# Patient Record
Sex: Male | Born: 1965 | Race: White | Hispanic: No | Marital: Married | Smoking: Never smoker
Health system: Southern US, Community
[De-identification: ages and names within clinical notes are randomized; demographics above are authoritative.]

## PROBLEM LIST (undated history)

## (undated) DIAGNOSIS — I77810 Thoracic aortic ectasia: Secondary | ICD-10-CM

## (undated) DIAGNOSIS — E785 Hyperlipidemia, unspecified: Secondary | ICD-10-CM

## (undated) DIAGNOSIS — I10 Essential (primary) hypertension: Secondary | ICD-10-CM

## (undated) DIAGNOSIS — I351 Nonrheumatic aortic (valve) insufficiency: Secondary | ICD-10-CM

## (undated) DIAGNOSIS — I5022 Chronic systolic (congestive) heart failure: Secondary | ICD-10-CM

## (undated) DIAGNOSIS — E119 Type 2 diabetes mellitus without complications: Secondary | ICD-10-CM

## (undated) DIAGNOSIS — J101 Influenza due to other identified influenza virus with other respiratory manifestations: Secondary | ICD-10-CM

## (undated) DIAGNOSIS — I420 Dilated cardiomyopathy: Secondary | ICD-10-CM

## (undated) HISTORY — PX: CORONARY ANGIOPLASTY: SHX604

## (undated) HISTORY — PX: OTHER SURGICAL HISTORY: SHX169

## (undated) HISTORY — DX: Hyperlipidemia, unspecified: E78.5

## (undated) SURGERY — Surgical Case
Anesthesia: *Unknown

---
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
## 1990-07-10 HISTORY — PX: COLONOSCOPY: SHX174

## 2014-11-25 ENCOUNTER — Encounter
Admission: EM | Disposition: A | Discharge status: Home / Self Care | Payer: Self-pay | Source: Home / Self Care | Attending: Internal Medicine

## 2014-11-25 SURGERY — LEFT HEART CATH AND CORONARY ANGIOGRAPHY
Anesthesia: Moderate Sedation | Laterality: Bilateral

## 2014-11-30 ENCOUNTER — Observation Stay (HOSPITAL_COMMUNITY)
Admit: 2014-11-30 | Discharge: 2014-11-30 | Disposition: A | Discharge status: Home / Self Care | Payer: Managed Care, Other (non HMO) | Attending: Physician Assistant | Admitting: Physician Assistant

## 2014-11-30 ENCOUNTER — Encounter: Payer: Self-pay | Admitting: Emergency Medicine

## 2014-11-30 ENCOUNTER — Emergency Department: Payer: Managed Care, Other (non HMO)

## 2014-11-30 ENCOUNTER — Inpatient Hospital Stay
Admission: EM | Admit: 2014-11-30 | Discharge: 2014-12-02 | DRG: 287 | Disposition: A | Discharge status: Home / Self Care | Payer: Managed Care, Other (non HMO) | Attending: Internal Medicine | Admitting: Internal Medicine

## 2014-11-30 DIAGNOSIS — R0600 Dyspnea, unspecified: Secondary | ICD-10-CM | POA: Diagnosis present

## 2014-11-30 DIAGNOSIS — I420 Dilated cardiomyopathy: Secondary | ICD-10-CM | POA: Diagnosis present

## 2014-11-30 DIAGNOSIS — Z833 Family history of diabetes mellitus: Secondary | ICD-10-CM | POA: Diagnosis not present

## 2014-11-30 DIAGNOSIS — R0602 Shortness of breath: Secondary | ICD-10-CM | POA: Diagnosis not present

## 2014-11-30 DIAGNOSIS — R7989 Other specified abnormal findings of blood chemistry: Secondary | ICD-10-CM | POA: Diagnosis not present

## 2014-11-30 DIAGNOSIS — Z8249 Family history of ischemic heart disease and other diseases of the circulatory system: Secondary | ICD-10-CM

## 2014-11-30 DIAGNOSIS — I248 Other forms of acute ischemic heart disease: Secondary | ICD-10-CM | POA: Diagnosis present

## 2014-11-30 DIAGNOSIS — I351 Nonrheumatic aortic (valve) insufficiency: Secondary | ICD-10-CM | POA: Diagnosis present

## 2014-11-30 DIAGNOSIS — E119 Type 2 diabetes mellitus without complications: Secondary | ICD-10-CM | POA: Diagnosis present

## 2014-11-30 DIAGNOSIS — I10 Essential (primary) hypertension: Secondary | ICD-10-CM | POA: Diagnosis present

## 2014-11-30 DIAGNOSIS — I77810 Thoracic aortic ectasia: Secondary | ICD-10-CM | POA: Diagnosis present

## 2014-11-30 DIAGNOSIS — I712 Thoracic aortic aneurysm, without rupture: Secondary | ICD-10-CM | POA: Diagnosis present

## 2014-11-30 DIAGNOSIS — I340 Nonrheumatic mitral (valve) insufficiency: Secondary | ICD-10-CM | POA: Diagnosis not present

## 2014-11-30 DIAGNOSIS — I5021 Acute systolic (congestive) heart failure: Secondary | ICD-10-CM | POA: Diagnosis present

## 2014-11-30 DIAGNOSIS — I509 Heart failure, unspecified: Secondary | ICD-10-CM

## 2014-11-30 DIAGNOSIS — E876 Hypokalemia: Secondary | ICD-10-CM | POA: Diagnosis present

## 2014-11-30 DIAGNOSIS — I359 Nonrheumatic aortic valve disorder, unspecified: Secondary | ICD-10-CM

## 2014-11-30 DIAGNOSIS — I7121 Aneurysm of the ascending aorta, without rupture: Secondary | ICD-10-CM | POA: Diagnosis present

## 2014-11-30 HISTORY — DX: Nonrheumatic aortic (valve) insufficiency: I35.1

## 2014-11-30 HISTORY — DX: Essential (primary) hypertension: I10

## 2014-11-30 HISTORY — DX: Chronic systolic (congestive) heart failure: I50.22

## 2014-11-30 HISTORY — DX: Dilated cardiomyopathy: I42.0

## 2014-11-30 HISTORY — DX: Type 2 diabetes mellitus without complications: E11.9

## 2014-11-30 HISTORY — DX: Thoracic aortic ectasia: I77.810

## 2014-11-30 LAB — CBC
HCT: 39.7 % — ABNORMAL LOW (ref 40.0–52.0)
Hemoglobin: 13.7 g/dL (ref 13.0–18.0)
MCH: 28 pg (ref 26.0–34.0)
MCHC: 34.5 g/dL (ref 32.0–36.0)
MCV: 81 fL (ref 80.0–100.0)
Platelets: 233 10*3/uL (ref 150–440)
RBC: 4.9 MIL/uL (ref 4.40–5.90)
RDW: 14.6 % — AB (ref 11.5–14.5)
WBC: 11.5 10*3/uL — ABNORMAL HIGH (ref 3.8–10.6)

## 2014-11-30 LAB — TROPONIN I
Troponin I: 0.18 ng/mL — ABNORMAL HIGH (ref <0.031)
Troponin I: 0.14 ng/mL — ABNORMAL HIGH (ref <0.031)
Troponin I: 0.15 ng/mL — ABNORMAL HIGH (ref <0.031)
Troponin I: 0.1 ng/mL — ABNORMAL HIGH (ref <0.031)

## 2014-11-30 LAB — GLUCOSE, CAPILLARY
Glucose-Capillary: 208 mg/dL — ABNORMAL HIGH (ref 65–99)
Glucose-Capillary: 191 mg/dL — ABNORMAL HIGH (ref 65–99)

## 2014-11-30 LAB — BASIC METABOLIC PANEL
Anion gap: 8 (ref 5–15)
BUN: 16 mg/dL (ref 6–20)
CO2: 24 mmol/L (ref 22–32)
Calcium: 9.1 mg/dL (ref 8.9–10.3)
Chloride: 106 mmol/L (ref 101–111)
Creatinine, Ser: 0.92 mg/dL (ref 0.61–1.24)
GFR calc Af Amer: >60 mL/min (ref >60)
Glucose, Bld: 186 mg/dL — ABNORMAL HIGH (ref 65–99)
POTASSIUM: 3.1 mmol/L — AB (ref 3.5–5.1)
Sodium: 138 mmol/L (ref 135–145)

## 2014-11-30 LAB — HEMOGLOBIN A1C: Hgb A1c MFr Bld: 7.4 % — ABNORMAL HIGH (ref 4.0–6.0)

## 2014-11-30 LAB — TSH: TSH: 3.689 u[IU]/mL (ref 0.350–4.500)

## 2014-11-30 LAB — BRAIN NATRIURETIC PEPTIDE: B NATRIURETIC PEPTIDE 5: 301 pg/mL — AB (ref 0.0–100.0)

## 2014-11-30 MED ORDER — ASPIRIN 81 MG PO CHEW
CHEWABLE_TABLET | ORAL | Status: AC
  Administered 2014-11-30: 324 mg via ORAL
  Filled 2014-11-30: qty 4

## 2014-11-30 MED ORDER — ASPIRIN 81 MG PO CHEW
81.0000 mg | CHEWABLE_TABLET | ORAL | Status: AC
  Administered 2014-12-01: 81 mg via ORAL
  Filled 2014-11-30: qty 1

## 2014-11-30 MED ORDER — INSULIN ASPART 100 UNIT/ML ~~LOC~~ SOLN: 0.0000 [IU] | Freq: Every day | SUBCUTANEOUS | Status: DC

## 2014-11-30 MED ORDER — NITROGLYCERIN 2 % TD OINT
TOPICAL_OINTMENT | TRANSDERMAL | Status: AC
  Administered 2014-11-30: 1 [in_us] via TOPICAL
  Filled 2014-11-30: qty 1

## 2014-11-30 MED ORDER — MORPHINE SULFATE 2 MG/ML IJ SOLN: 2.0000 mg | INTRAMUSCULAR | Status: DC | PRN

## 2014-11-30 MED ORDER — ACETAMINOPHEN 325 MG PO TABS: 650.0000 mg | ORAL_TABLET | Freq: Four times a day (QID) | ORAL | Status: DC | PRN

## 2014-11-30 MED ORDER — ZOLPIDEM TARTRATE 5 MG PO TABS
5.0000 mg | ORAL_TABLET | Freq: Every evening | ORAL | Status: DC | PRN
  Administered 2014-11-30: 5 mg via ORAL
  Filled 2014-11-30: qty 1

## 2014-11-30 MED ORDER — CARVEDILOL 3.125 MG PO TABS
3.1250 mg | ORAL_TABLET | Freq: Two times a day (BID) | ORAL | Status: DC
  Administered 2014-11-30 – 2014-12-01 (×2): 3.125 mg via ORAL
  Filled 2014-11-30 (×2): qty 1

## 2014-11-30 MED ORDER — ASPIRIN 81 MG PO CHEW
324.0000 mg | CHEWABLE_TABLET | Freq: Once | ORAL | Status: AC
  Administered 2014-11-30: 324 mg via ORAL

## 2014-11-30 MED ORDER — POTASSIUM CHLORIDE CRYS ER 20 MEQ PO TBCR
80.0000 meq | EXTENDED_RELEASE_TABLET | ORAL | Status: AC
  Administered 2014-11-30: 80 meq via ORAL
  Filled 2014-11-30: qty 4

## 2014-11-30 MED ORDER — NITROGLYCERIN 2 % TD OINT
1.0000 [in_us] | TOPICAL_OINTMENT | Freq: Once | TRANSDERMAL | Status: AC
  Administered 2014-11-30: 1 [in_us] via TOPICAL

## 2014-11-30 MED ORDER — LISINOPRIL 5 MG PO TABS
5.0000 mg | ORAL_TABLET | Freq: Every day | ORAL | Status: DC
  Administered 2014-12-01 – 2014-12-02 (×2): 5 mg via ORAL
  Filled 2014-11-30 (×2): qty 1

## 2014-11-30 MED ORDER — ONDANSETRON HCL 4 MG/2ML IJ SOLN
4.0000 mg | Freq: Four times a day (QID) | INTRAMUSCULAR | Status: DC | PRN
  Administered 2014-11-30: 4 mg via INTRAVENOUS
  Filled 2014-11-30: qty 2

## 2014-11-30 MED ORDER — ONDANSETRON HCL 4 MG PO TABS: 4.0000 mg | ORAL_TABLET | Freq: Four times a day (QID) | ORAL | Status: DC | PRN

## 2014-11-30 MED ORDER — ATORVASTATIN CALCIUM 20 MG PO TABS
80.0000 mg | ORAL_TABLET | ORAL | Status: AC
  Administered 2014-11-30: 80 mg via ORAL
  Filled 2014-11-30: qty 4

## 2014-11-30 MED ORDER — FUROSEMIDE 10 MG/ML IJ SOLN
INTRAMUSCULAR | Status: AC
  Administered 2014-11-30: 40 mg via INTRAVENOUS
  Filled 2014-11-30: qty 4

## 2014-11-30 MED ORDER — POTASSIUM CHLORIDE CRYS ER 10 MEQ PO TBCR
10.0000 meq | EXTENDED_RELEASE_TABLET | Freq: Two times a day (BID) | ORAL | Status: DC
  Administered 2014-11-30 – 2014-12-02 (×4): 10 meq via ORAL
  Filled 2014-11-30 (×4): qty 1

## 2014-11-30 MED ORDER — ATORVASTATIN CALCIUM 20 MG PO TABS
80.0000 mg | ORAL_TABLET | Freq: Every day | ORAL | Status: DC
  Administered 2014-12-01 – 2014-12-02 (×2): 80 mg via ORAL
  Filled 2014-11-30: qty 4
  Filled 2014-11-30: qty 1
  Filled 2014-11-30: qty 4

## 2014-11-30 MED ORDER — FUROSEMIDE 10 MG/ML IJ SOLN
20.0000 mg | Freq: Two times a day (BID) | INTRAMUSCULAR | Status: DC
  Administered 2014-11-30 – 2014-12-02 (×4): 20 mg via INTRAVENOUS
  Filled 2014-11-30 (×3): qty 2

## 2014-11-30 MED ORDER — ACETAMINOPHEN 650 MG RE SUPP: 650.0000 mg | Freq: Four times a day (QID) | RECTAL | Status: DC | PRN

## 2014-11-30 MED ORDER — HEPARIN SODIUM (PORCINE) 5000 UNIT/ML IJ SOLN
5000.0000 [IU] | Freq: Three times a day (TID) | INTRAMUSCULAR | Status: DC
  Administered 2014-11-30 – 2014-12-02 (×6): 5000 [IU] via SUBCUTANEOUS
  Filled 2014-11-30 (×7): qty 1

## 2014-11-30 MED ORDER — INSULIN ASPART 100 UNIT/ML ~~LOC~~ SOLN
0.0000 [IU] | Freq: Three times a day (TID) | SUBCUTANEOUS | Status: DC
  Administered 2014-11-30: 3 [IU] via SUBCUTANEOUS
  Administered 2014-12-02: 2 [IU] via SUBCUTANEOUS
  Filled 2014-11-30: qty 3
  Filled 2014-11-30: qty 2

## 2014-11-30 MED ORDER — FUROSEMIDE 10 MG/ML IJ SOLN
40.0000 mg | Freq: Once | INTRAMUSCULAR | Status: AC
  Administered 2014-11-30: 40 mg via INTRAVENOUS

## 2014-11-30 NOTE — Consult Note (Signed)
Cardiology Consultation Note  Patient ID: Dennis Curry, MRN: [id number], DOB/AGE: [birth year] 49 y.o. Admit date: 11/30/2014   Date of Consult: 11/30/2014 Primary Physician: No PCP Per Patient Primary Cardiologist: New to [facility]  Chief Complaint: SOB and nasal congestion  Reason for Consult: Elevated troponin   HPI: 49 y.o. male with h/o DM2 and HTN who presented to [facility] on 5/22 with a 1 month history of progressively worsening nasal congestion, post nasal drip, cough productive of clear sputum, and SOB. He was found to have an elevated troponin of 0.18-->0.14 and CXR with minimal interstitial edema. Cardiology is consulted for further evaluation of elevated troponin.   He has no previously known cardiac history. He reports undergoing a stress test and echo in 2010 when he was diagnosed with DM2 for baseline purposes. He self reports these results as being normal, though the results are not in [place], [place], or [employer]. No prior cardiac caths.  He has not taken any diabetic medication or antihypertensive medication for greater than 2 years.   Over the past 1 month he has been experiencing increased SOB, congestion, cough that is productive of clear sputum, orthopnea, and PND. He has seen outside urgent care and ENT and been treated with amoxicillin and nasal sprays without any relief of his symptoms. He denies any chest pain with any of his symptoms. Over the past 2 weeks his symptoms have gotten considerably worse. While he was taking his trash out to the street he became acutely SOB requiring him to stop and take a break. This prompted him to come to [facility] ED for evaluation. He has never been without symptoms over the past 1 month.   Upon his arrival to [facility] he was found to have a CXR that showed cardiomegaly and minimal interstitial edema. BNP of 301. WBC 11.5. K+ 3.1. Troponin was checked and found to be 0.18-->0.14. EKG showed sinus tachycardia, 107 bpm, left axis deviation, poor R wave  progression, lateral TWI. He has remained chest pain free. He was started on sub Q heparin 5,000 units. He received IV Lasix 40 mg x 1. He remains with crackles bilaterally.     Past Medical History  Diagnosis Date  . Diabetes mellitus without complication   . Hypertension       Most Recent Cardiac Studies: None.   Surgical History:  Past Surgical History  Procedure Laterality Date  . Other surgical history      none     Home Meds: Prior to Admission medications   Not on File    Inpatient Medications:  . heparin  5,000 Units Subcutaneous 3 times per day      Allergies: No Known Allergies  History   Social History  . Marital Status: Married    Spouse Name: N/A  . Number of Children: N/A  . Years of Education: N/A   Occupational History  . Not on file.   Social History Main Topics  . Smoking status: Never Smoker   . Smokeless tobacco: Not on file  . Alcohol Use: No  . Drug Use: Not on file  . Sexual Activity: Not on file   Other Topics Concern  . Not on file   Social History Narrative   Lives with wife     Family History  Problem Relation Age of Onset  . Coronary artery disease Mother   . Hypertension Father   . Diabetes Mellitus II Paternal Uncle      Review of Systems: Review of Systems  Constitutional: Positive for malaise/fatigue and diaphoresis. Negative for fever, chills and weight loss.  HENT: Positive for congestion. Negative for sore throat and tinnitus.   Eyes: Negative for pain, discharge and redness.  Respiratory: Positive for cough, sputum production and shortness of breath. Negative for hemoptysis, wheezing and stridor.        Clear sputum  Cardiovascular: Positive for orthopnea and PND. Negative for chest pain, palpitations, claudication and leg swelling.  Gastrointestinal: Positive for nausea. Negative for heartburn, vomiting, abdominal pain, diarrhea, constipation, blood in stool and melena.  Genitourinary: Negative for dysuria,  urgency and frequency.  Musculoskeletal: Negative for myalgias, back pain, falls and neck pain.  Skin: Negative for itching and rash.  Neurological: Positive for weakness and headaches. Negative for dizziness, tingling, tremors, sensory change and focal weakness.  Psychiatric/Behavioral: Negative for depression and substance abuse. The patient is not nervous/anxious.   All other systems were reviewed and negative.   Labs:  Recent Labs  11/30/14 0121 11/30/14 0608  TROPONINI 0.18* 0.14*   Lab Results  Component Value Date   WBC 11.5* 11/30/2014   HGB 13.7 11/30/2014   HCT 39.7* 11/30/2014   MCV 81.0 11/30/2014   PLT 233 11/30/2014    Recent Labs Lab 11/30/14 0121  NA 138  K 3.1*  CL 106  CO2 24  BUN 16  CREATININE 0.92  CALCIUM 9.1  GLUCOSE 186*   No results found for: CHOL, HDL, LDLCALC, TRIG No results found for: DDIMER  Radiology/Studies:  Dg Chest 2 View  11/30/2014   CLINICAL DATA:  Shortness of breath for 2 months, worse at night.  EXAM: CHEST  2 VIEW  COMPARISON:  None.  FINDINGS: There is mild cardiomegaly. Aortic and hilar contours are negative. Few Kerley A and B-lines noted. Trace posterior pleural fluid. No pneumonia. No pneumothorax. No acute osseous findings.  IMPRESSION: Cardiomegaly and minimal interstitial edema.   Electronically Signed   By: Marnee SpringJonathon  Watts M.D.   On: 11/30/2014 02:10    EKG: sinus tachycardia, 107 bpm, left axis deviation, poor R wave progression, lateral TWI  Weights: Filed Weights   11/30/14 0111 11/30/14 0536  Weight: 223 lb (101.152 kg) 217 lb 4.8 oz (98.567 kg)     Physical Exam: Blood pressure 159/84, pulse 91, temperature 98.4 F (36.9 C), temperature source Oral, resp. rate 19, height 6' (1.829 m), weight 217 lb 4.8 oz (98.567 kg), SpO2 97 %. Body mass index is 29.46 kg/(m^2). General: Well developed, well nourished, in no acute distress. Head: Normocephalic, atraumatic, sclera non-icteric, no xanthomas, nares are  without discharge.  Neck: Negative for carotid bruits. JVD not elevated. Lungs: Bilateral crackles extending superiorly 2/3 up. No wheezes rhonchi. Breathing is unlabored. Heart: RRR with S1 S2. No murmurs, rubs, or gallops appreciated. Abdomen: Soft, non-tender, non-distended with normoactive bowel sounds. No hepatomegaly. No rebound/guarding. No obvious abdominal masses. Msk:  Strength and tone appear normal for age. Extremities: No clubbing or cyanosis. Trace pitting edema to the mid shin bilaterally.  Distal pedal pulses are 2+ and equal bilaterally. Neuro: Alert and oriented X 3. No facial asymmetry. No focal deficit. Moves all extremities spontaneously. Psych:  Responds to questions appropriately with a normal affect.    Assessment and Plan:  49 y.o. male with h/o DM2 and HTN who [facility] with a 1 month history of progressively worsening nasal congestion, post nasal drip, cough productive of clear sputum, and SOB. He was found to have an elevated troponin of 0.18-->0.14 and  CXR with minimal interstitial edema.   1. Elevated troponin: -Question of supply demand ischemia in the setting of acute CHF and accelerated HTN vs NSTEMI -Down trending, trend final value -Check echo to evaluate LV function and wall motion  -He will need an ischemic evaluation given his symptoms of increasing SOB, PND, and DM2 -Schedule cardiac cath 5/24  -Add Coreg 3.125 mg bid -Add Lipitor 80 mg -Continue aspirin 81 mg  2. SOB: -Consistent acute on likely chronic CHF, evaluate for ischemia  -Continue gentle diuresis today, hold Lasix in the AM on 5/24 -Add Coreg as above  3. DM2: -A1C pending -Per IM  4. HTN: -Presented uncontrolled -Improving -Add Coreg and Lasix as above  5. Hypokalemia: -Replete     Signed, Eula Listen, PA-C Pager: [phone] 11/30/2014, 9:13 AM

## 2014-11-30 NOTE — ED Notes (Addendum)
Pt uprite on stretcher in exam room with no distress noted; voiced understanding of meds orders and plan of care; pt denies c/o at present; O2 sat 87-94%; O2 placed at 2l/min via [place]

## 2014-11-30 NOTE — Progress Notes (Signed)
*  PRELIMINARY RESULTS* Echocardiogram 2D Echocardiogram has been performed.  Georgann HousekeeperJerry R Hege 11/30/2014, 3:28 PM

## 2014-11-30 NOTE — H&P (Signed)
Dennis Curry is an 49 y.o. male.   Chief Complaint: Shortness of breath HPI: She presents to the emergency department complaining of shortness of breath. He has become progressively more dyspneic over the last few weeks. He mentions that he was diagnosed with bronchitis and a possible sinus infection approximately one month ago at which time his cough began. Since that time he has noticed he's had a lot of phlegm production at night which she can "hear rattling in his chest". He admits to a few episodes of posttussive emesis. It has been nonbloody and nonbilious. He denies any chest pain the symptoms. He underwent an echocardiogram and stress test in 2010 which is reportedly negative. In the emergency department he was found to be hypertensive and tachycardic. Initial troponin was elevated at 0.18 which prompted emergency department to call for admission.  Past Medical History  Diagnosis Date  . Diabetes mellitus without complication   . Hypertension     Past Surgical History  Procedure Laterality Date  . Other surgical history      none    Family History  Problem Relation Age of Onset  . Coronary artery disease Mother   . Hypertension Father   . Diabetes Mellitus II Paternal Uncle    Social History:  reports that he has never smoked. He does not have any smokeless tobacco history on file. He reports that he does not drink alcohol. His drug history is not on file.  Allergies: No Known Allergies  No prescriptions prior to admission    Results for orders placed or performed during the hospital encounter of 11/30/14 (from the past 48 hour(s))  CBC     Status: Abnormal   Collection Time: 11/30/14  1:21 AM  Result Value Ref Range   WBC 11.5 (H) 3.8 - 10.6 K/uL   RBC 4.90 4.40 - 5.90 MIL/uL   Hemoglobin 13.7 13.0 - 18.0 g/dL   HCT 39.7 (L) 40.0 - 52.0 %   MCV 81.0 80.0 - 100.0 fL   MCH 28.0 26.0 - 34.0 pg   MCHC 34.5 32.0 - 36.0 g/dL   RDW 14.6 (H) 11.5 - 14.5 %   Platelets 233  150 - 440 K/uL  Troponin I     Status: Abnormal   Collection Time: 11/30/14  1:21 AM  Result Value Ref Range   Troponin I 0.18 (H) <0.031 ng/mL    Comment: READ BACK AND VERIFIED WITH MICHELLE MORTON @ 6568 5.23.16 MPG        PERSISTENTLY INCREASED TROPONIN VALUES IN THE RANGE OF 0.04-0.49 ng/mL CAN BE SEEN IN:       -UNSTABLE ANGINA       -CONGESTIVE HEART FAILURE       -MYOCARDITIS       -CHEST TRAUMA       -ARRYHTHMIAS       -LATE PRESENTING MYOCARDIAL INFARCTION       -COPD   CLINICAL FOLLOW-UP RECOMMENDED.   Basic metabolic panel     Status: Abnormal   Collection Time: 11/30/14  1:21 AM  Result Value Ref Range   Sodium 138 135 - 145 mmol/L   Potassium 3.1 (L) 3.5 - 5.1 mmol/L   Chloride 106 101 - 111 mmol/L   CO2 24 22 - 32 mmol/L   Glucose, Bld 186 (H) 65 - 99 mg/dL   BUN 16 6 - 20 mg/dL   Creatinine, Ser 0.92 0.61 - 1.24 mg/dL   Calcium 9.1 8.9 - 10.3 mg/dL   GFR  calc non Af Amer >60 >60 mL/min   GFR calc Af Amer >60 >60 mL/min    Comment: (NOTE) The eGFR has been calculated using the CKD EPI equation. This calculation has not been validated in all clinical situations. eGFR's persistently <60 mL/min signify possible Chronic Kidney Disease.    Anion gap 8 5 - 15  BNP (order ONLY if patient complains of dyspnea/SOB AND you have documented it for THIS visit)     Status: Abnormal   Collection Time: 11/30/14  1:21 AM  Result Value Ref Range   B Natriuretic Peptide 301.0 (H) 0.0 - 100.0 pg/mL   Dg Chest 2 View  11/30/2014   CLINICAL DATA:  Shortness of breath for 2 months, worse at night.  EXAM: CHEST  2 VIEW  COMPARISON:  None.  FINDINGS: There is mild cardiomegaly. Aortic and hilar contours are negative. Few Kerley A and B-lines noted. Trace posterior pleural fluid. No pneumonia. No pneumothorax. No acute osseous findings.  IMPRESSION: Cardiomegaly and minimal interstitial edema.   Electronically Signed   By: Monte Fantasia M.D.   On: 11/30/2014 02:10    Review of  Systems  Constitutional: Negative for fever and chills.  HENT: Negative for sore throat and tinnitus.   Eyes: Negative for blurred vision and redness.  Respiratory: Positive for cough, sputum production and shortness of breath.   Cardiovascular: Negative for chest pain, palpitations, orthopnea and PND.  Gastrointestinal: Positive for nausea and vomiting. Negative for abdominal pain and diarrhea.       Post-tussive  Genitourinary: Negative for dysuria, urgency and frequency.  Musculoskeletal: Negative for myalgias and joint pain.  Skin: Negative for rash.       No lesions  Neurological: Negative for speech change, focal weakness and weakness.  Endo/Heme/Allergies: Does not bruise/bleed easily.       No temperature intolerance  Psychiatric/Behavioral: Negative for depression and suicidal ideas.    Blood pressure 159/84, pulse 91, temperature 98.4 F (36.9 C), temperature source Oral, resp. rate 19, height 6' (1.829 m), weight 98.567 kg (217 lb 4.8 oz), SpO2 97 %. Physical Exam  Nursing note and vitals reviewed. Constitutional: He is oriented to person, place, and time. He appears well-developed and well-nourished.  HENT:  Head: Normocephalic and atraumatic.  Mouth/Throat: Oropharynx is clear and moist.  Eyes: Conjunctivae and EOM are normal. Pupils are equal, round, and reactive to light.  Neck: Normal range of motion. Neck supple. No JVD present. No tracheal deviation present. No thyromegaly present.  Cardiovascular: Regular rhythm, normal heart sounds and intact distal pulses.  Tachycardia present.  Exam reveals no gallop and no friction rub.   No murmur heard. Respiratory: Effort normal. No respiratory distress. He has no wheezes. He has rales.  GI: Soft. Bowel sounds are normal. He exhibits no distension. There is no tenderness.  Genitourinary:  deferred  Musculoskeletal: Normal range of motion. He exhibits no edema.  Lymphadenopathy:    He has no cervical adenopathy.   Neurological: He is alert and oriented to person, place, and time. No cranial nerve deficit.  Skin: Skin is warm and dry. No rash noted. No erythema.  Psychiatric: He has a normal mood and affect. His behavior is normal. Judgment and thought content normal.     Assessment/Plan This is a 49 year old male admitted for NSTEMI. 1. NSTEMI: Initial troponin is mildly elevated. Patient does not have any chest pain however he has diabetes which could cause painless coronary ischemia. The patient has not had any EKG changes.  I have deferred heparin for now. We will continue to follow cardiac enzymes and cardiology consult has been ordered. I started the patient on a beta blocker to lower his heart rate and oxygen demand. Nitroglycerin paste is in place. Check TSH 2. Shortness of breath: The patient has mild interstitial edema. He has received Lasix IV in the emergency department and feels much better. Hopefully some of his pulmonary congestion will be relieved when his blood pressure is more controlled 3. Hypertension: The patient has been started on carvedilol. Titrate antihypertensives as needed 4. Diabetes mellitus type 2: Obtain hemoglobin A1c. Start sliding scale insulin as needed. The patient may be a good candidate for oral hypoglycemics but he has not been taking any medicines for quite some time. 5. DVT prophylaxis: Heparin 6. GI prophylaxis: None The patient is a full code. Time spent on admission orders and patient care approximately 45 minutes Harrie Foreman 11/30/2014, 7:09 AM

## 2014-11-30 NOTE — Care Management (Signed)
Patient admitted under observation for shortness of breath.  Diagnosed with new onset CHF.  Patient is requiring IV lasix.  Attending states that patient is for cardiac cath 5/24.  He is on supplemental 02 which is acute.  Discussed the need to assess for possible need of home 02 during progression.

## 2014-11-30 NOTE — ED Notes (Addendum)
Pt presents to ER alert and in NAD. Pt reports increasing SOB at night when laying down. Pt states it feels like a lot of mucus in his nose. Pt has seen ENT for this issue. Pt has labored resp at this time. NAD.

## 2014-11-30 NOTE — Progress Notes (Deleted)
[id number]

## 2014-11-30 NOTE — ED Provider Notes (Signed)
[facility] Emergency Department Provider Note  ____________________________________________  Time seen: Approximately 3:44 AM  I have reviewed the triage vital signs and the nursing notes.   HISTORY  Chief Complaint Shortness of Breath    HPI Dennis Curry is a 49 y.o. male who comes in tonight with congestion and shortness of breath. The patient reports that this is going on for the past 2 months. He reports that today it seems to be worse. The patient reports that he has been to urgent care and the ear nose and throat doctor multiple times and told that he either had a sinus infection or allergies. The patient has been tried on amoxicillin without any improvement in his symptoms. The patient though has been having some increased dyspnea on exertion over the last few days and per his wife has been unable to sleep laying flat for multiple weeks. They report that this is due to mucous drainage. The patient denies any swelling in his legs, and also denies any chest pain. The patient reports that he feels as though his abdomen has been more swollen from all the mucus that goes down in his stomach. The patient has been using Flonase, Zyrtec, Claritin, Nasacort, saline nasal sprays without any success. The patient reports that her friend told him he may be having silent reflux as well. The patient reports that the mucus has been clear without any significant color. The patient's wife brought him in today as he was significantly short of breath and she was concerned as to what may have been going on. The patient has had some nausea and vomiting some sweats as well.The patient reports that he has not been taking his metformin or his blood pressure medication the past year.   Past Medical History  Diagnosis Date  . Diabetes mellitus without complication   . Hypertension     There are no active problems to display for this patient.   History reviewed. No pertinent past  surgical history.  No current outpatient prescriptions on file.  Allergies Review of patient's allergies indicates no known allergies.  No family history on file.  Social History History  Substance Use Topics  . Smoking status: Never Smoker   . Smokeless tobacco: Not on file  . Alcohol Use: No    Review of Systems Constitutional: No fever/chills Eyes: No visual changes. ENT: No sore throat. Cardiovascular: Denies chest pain. Respiratory:  shortness of breath. Gastrointestinal: Abdominal distention, nausea, vomiting Genitourinary: Negative for dysuria. Musculoskeletal: Negative for back pain. Skin: Negative for rash. Neurological: Negative for headaches,  Hematological/Lymphatic:Denies leg swelling  10-point ROS otherwise negative.  ____________________________________________   PHYSICAL EXAM:  VITAL SIGNS: ED Triage Vitals  Enc Vitals Group     BP 11/30/14 0111 168/96 mmHg     Pulse Rate 11/30/14 0111 107     Resp 11/30/14 0111 28     Temp 11/30/14 0111 98.3 F (36.8 C)     Temp Source 11/30/14 0111 Oral     SpO2 11/30/14 0111 99 %     Weight 11/30/14 0111 223 lb (101.152 kg)     Height 11/30/14 0111 6' (1.829 m)     Head Cir --      Peak Flow --      Pain Score --      Pain Loc --      Pain Edu? --      Excl. in GC? --     Constitutional: Alert and oriented. Well appearing and  in no acute distress. Eyes: Conjunctivae are normal. PERRL. EOMI. Head: Atraumatic. Nose: No congestion/rhinnorhea. Mouth/Throat: Mucous membranes are moist.  Oropharynx non-erythematous. Cardiovascular: Tachycardic regular rhythm. Grossly normal heart sounds.  Good peripheral circulation. Respiratory: Normal respiratory effort.  Rales in bilateral lung bases, wheezing all lung fields Gastrointestinal: Soft and nontender. Mild distention. Positive bowel sounds. Genitourinary: Deferred Musculoskeletal: Mild lower extremity nonpitting edema. Neurologic:  Normal speech and  language. No gross focal neurologic deficits are appreciated.  Skin:  Skin is warm, dry and intact. No rash noted. Psychiatric: Mood and affect are normal. .  ____________________________________________   LABS (all labs ordered are listed, but only abnormal results are displayed)  Labs Reviewed  CBC - Abnormal; Notable for the following:    WBC 11.5 (*)    HCT 39.7 (*)    RDW 14.6 (*)    All other components within normal limits  TROPONIN I - Abnormal; Notable for the following:    Troponin I 0.18 (*)    All other components within normal limits  BASIC METABOLIC PANEL - Abnormal; Notable for the following:    Potassium 3.1 (*)    Glucose, Bld 186 (*)    All other components within normal limits  BRAIN NATRIURETIC PEPTIDE - Abnormal; Notable for the following:    B Natriuretic Peptide 301.0 (*)    All other components within normal limits   ____________________________________________  EKG  ED ECG REPORT I, Rebecka ApleyWebster,  Allison P, the attending physician, personally viewed and interpreted this ECG.   Date: 11/30/2014  EKG Time: 117  Rate: 107  Rhythm: sinus tachycardia  Axis: Left  Intervals:none  ST&T Change: Flipped T waves in leads 1 and aVL, T-wave flattening in lead V6  ____________________________________________  RADIOLOGY  Chest x-ray: Cardiomegaly and minimal interstitial edema ____________________________________________   PROCEDURES  Procedure(s) performed: None  Critical Care performed: No  ____________________________________________   INITIAL IMPRESSION / ASSESSMENT AND PLAN / ED COURSE  Pertinent labs & imaging results that were available during my care of the patient were reviewed by me and considered in my medical decision making (see chart for details).  This is a 49 year old male who comes in with shortness of breath, increased dyspnea on exertion and nocturnal dyspnea. The patient has some fluid in his lungs which is concerning for  possible new onset of heart failure. I will admit the patient to the hospitalist service. I will give the patient and Nitropaste to his chest as his blood pressure is elevated and I will give the patient a dose of Lasix. I discussed this with the patient and he does agree with the plan. He will also receive and aspirin  I discussed the case with Dr. Sheryle Haildiamond who will admit the patient to the hospitalist service ____________________________________________   FINAL CLINICAL IMPRESSION(S) / ED DIAGNOSES  Final diagnoses:  Heart failure, unspecified heart failure chronicity, unspecified heart failure type  Dyspnea      Rebecka ApleyAllison P Webster, MD 11/30/14 [id number]

## 2014-11-30 NOTE — ED Notes (Signed)
Pt uprite on stretcher in exam room with no distress noted; card monitor placed; pt reports last 2months having increased sinus drainage & mucous when lying down accomp by SOB; has been seen for such and referred to ENT; dx with allergies but meds are not helping; tonight noted SOB with exertion; denies pain, denies hx of same

## 2014-11-30 NOTE — Progress Notes (Signed)
[facility] [place] [facility]   PATIENT NAME: Dennis PeersWilliam Curry    MR#:  [id number]  DATE OF BIRTH:  03/05/1966  SUBJECTIVE:  CHIEF COMPLAINT:  better shortness of breath but still has cough Chief Complaint  Patient presents with  . Shortness of Breath    Pt presents to ER alert and in NAD. Pt reports increasing SOB at night when laying down. Pt states it feels like a lot of mucus in his nose. Pt has seen ENT for this issue.    REVIEW OF SYSTEMS:  CONSTITUTIONAL: No fever, fatigue or weakness.  EYES: No blurred or double vision.  EARS, NOSE, AND THROAT: No tinnitus or ear pain.  RESPIRATORY: Positive cough and shortness of breath, no wheezing or hemoptysis.  CARDIOVASCULAR: No chest pain, orthopnea, edema.  GASTROINTESTINAL: No nausea, vomiting, diarrhea or abdominal pain.  GENITOURINARY: No dysuria, hematuria.  ENDOCRINE: No polyuria, nocturia,  HEMATOLOGY: No anemia, easy bruising or bleeding SKIN: No rash or lesion. MUSCULOSKELETAL: No joint pain or arthritis.   NEUROLOGIC: No tingling, numbness, weakness.  PSYCHIATRY: No anxiety or depression.   DRUG ALLERGIES:  No Known Allergies  VITALS:  Blood pressure 155/80, pulse 96, temperature 99 F (37.2 C), temperature source Oral, resp. rate 20, height 6' (1.829 m), weight 98.567 kg (217 lb 4.8 oz), SpO2 98 %.  PHYSICAL EXAMINATION:  GENERAL:  49 y.o.-year-old patient lying in the bed with no acute distress.  EYES: Pupils equal, round, reactive to light and accommodation. No scleral icterus. Extraocular muscles intact.  HEENT: Head atraumatic, normocephalic. Oropharynx and nasopharynx clear.  NECK:  Supple, no jugular venous distention. No thyroid enlargement, no tenderness.  LUNGS: Normal breath sounds bilaterally, no wheezing, bilateral basilar rales, no rhonchi or crepitation. No use of accessory muscles of respiration.  CARDIOVASCULAR: S1, S2 normal. No murmurs, rubs, or gallops.  ABDOMEN:  Soft, nontender, nondistended. Bowel sounds present. No organomegaly or mass.  EXTREMITIES: Trace pedal edema, no cyanosis, or clubbing.  NEUROLOGIC: Cranial nerves II through XII are intact. Muscle strength 5/5 in all extremities. Sensation intact. Gait not checked.  PSYCHIATRIC: The patient is alert and oriented x 3.  SKIN: No obvious rash, lesion, or ulcer.    LABORATORY PANEL:   CBC  Recent Labs Lab 11/30/14 0121  WBC 11.5*  HGB 13.7  HCT 39.7*  PLT 233   ------------------------------------------------------------------------------------------------------------------  Chemistries   Recent Labs Lab 11/30/14 0121  NA 138  K 3.1*  CL 106  CO2 24  GLUCOSE 186*  BUN 16  CREATININE 0.92  CALCIUM 9.1   ------------------------------------------------------------------------------------------------------------------  Cardiac Enzymes  Recent Labs Lab 11/30/14 1109  TROPONINI 0.15*   ------------------------------------------------------------------------------------------------------------------  RADIOLOGY:  Dg Chest 2 View  11/30/2014   CLINICAL DATA:  Shortness of breath for 2 months, worse at night.  EXAM: CHEST  2 VIEW  COMPARISON:  None.  FINDINGS: There is mild cardiomegaly. Aortic and hilar contours are negative. Few Kerley A and B-lines noted. Trace posterior pleural fluid. No pneumonia. No pneumothorax. No acute osseous findings.  IMPRESSION: Cardiomegaly and minimal interstitial edema.   Electronically Signed   By: Marnee SpringJonathon  Watts M.D.   On: 11/30/2014 02:10    EKG:   Orders placed or performed during the hospital encounter of 11/30/14  . ED EKG (<6310mins upon arrival to the ED)  . ED EKG (<6010mins upon arrival to the ED)  . EKG 12-Lead  . EKG 12-Lead    ASSESSMENT AND PLAN:   This is a  49 year old male admitted for NSTEMI. 1. Acute on likely chronic CHF: Continue Lasix IV, follow-up echocardiogram.  2. Elevated troponin. demand ischemia in the  setting of acute CHF and accelerated HTN /NSTEMI: Per cardiology consult, continue aspirin, add Coreg and Lipitor, scheduled Cardiac Catheter Tomorrow. 3. Hypertension: add carvedilol and lisinopril, continue Lasix. 4. Diabetes mellitus type 2: Continue sliding scale insulin.  Hypokalemia. Potassium supplement and follow-up BMP.    All the records are reviewed and case discussed with Care Management/Social Workerr. Management plans discussed with the patient, the patient's wife and they are in agreement.  CODE STATUS: Full code  TOTAL TIME TAKING CARE OF THIS PATIENT: 47 minutes.   POSSIBLE D/C IN 2 DAYS, DEPENDING ON CLINICAL CONDITION.   Shaune Pollack M.D on 11/30/2014 at 4:13 PM  Between 7am to 6pm - Pager - [phone]  After 6pm go to [email or web address] - password [email or web address]  [place] [facility]  Office  [phone]  CC: Primary care physician; No PCP Per Patient

## 2014-12-01 ENCOUNTER — Encounter
Admission: EM | Disposition: A | Discharge status: Home / Self Care | Payer: Managed Care, Other (non HMO) | Source: Home / Self Care | Attending: Internal Medicine

## 2014-12-01 ENCOUNTER — Other Ambulatory Visit: Payer: Self-pay | Admitting: Cardiovascular Disease

## 2014-12-01 ENCOUNTER — Inpatient Hospital Stay
Admit: 2014-12-01 | Discharge: 2014-12-01 | Disposition: A | Discharge status: Home / Self Care | Payer: Managed Care, Other (non HMO) | Attending: Cardiovascular Disease | Admitting: Cardiovascular Disease

## 2014-12-01 ENCOUNTER — Encounter: Payer: Self-pay | Admitting: *Deleted

## 2014-12-01 ENCOUNTER — Encounter
Admission: EM | Disposition: A | Discharge status: Home / Self Care | Payer: Self-pay | Source: Home / Self Care | Attending: Internal Medicine

## 2014-12-01 DIAGNOSIS — R0600 Dyspnea, unspecified: Secondary | ICD-10-CM | POA: Diagnosis present

## 2014-12-01 DIAGNOSIS — I7121 Aneurysm of the ascending aorta, without rupture: Secondary | ICD-10-CM | POA: Diagnosis present

## 2014-12-01 DIAGNOSIS — R0602 Shortness of breath: Secondary | ICD-10-CM

## 2014-12-01 DIAGNOSIS — I359 Nonrheumatic aortic valve disorder, unspecified: Secondary | ICD-10-CM | POA: Diagnosis present

## 2014-12-01 DIAGNOSIS — I712 Thoracic aortic aneurysm, without rupture: Secondary | ICD-10-CM | POA: Diagnosis present

## 2014-12-01 LAB — BASIC METABOLIC PANEL
Anion gap: 9 (ref 5–15)
BUN: 19 mg/dL (ref 6–20)
CHLORIDE: 101 mmol/L (ref 101–111)
CO2: 27 mmol/L (ref 22–32)
CREATININE: 0.82 mg/dL (ref 0.61–1.24)
Calcium: 7.8 mg/dL — ABNORMAL LOW (ref 8.9–10.3)
GFR calc Af Amer: >60 mL/min (ref >60)
GFR calc non Af Amer: >60 mL/min (ref >60)
Glucose, Bld: 191 mg/dL — ABNORMAL HIGH (ref 65–99)
Potassium: 3.2 mmol/L — ABNORMAL LOW (ref 3.5–5.1)
Sodium: 137 mmol/L (ref 135–145)

## 2014-12-01 LAB — GLUCOSE, CAPILLARY
Glucose-Capillary: 168 mg/dL — ABNORMAL HIGH (ref 65–99)
Glucose-Capillary: 189 mg/dL — ABNORMAL HIGH (ref 65–99)
Glucose-Capillary: 187 mg/dL — ABNORMAL HIGH (ref 65–99)
Glucose-Capillary: 189 mg/dL — ABNORMAL HIGH (ref 65–99)

## 2014-12-01 LAB — MAGNESIUM: MAGNESIUM: 1.8 mg/dL (ref 1.7–2.4)

## 2014-12-01 SURGERY — ECHOCARDIOGRAM, TRANSESOPHAGEAL
Anesthesia: Moderate Sedation

## 2014-12-01 SURGERY — RIGHT AND LEFT HEART CATH
Anesthesia: Moderate Sedation

## 2014-12-01 MED ORDER — POTASSIUM CHLORIDE CRYS ER 20 MEQ PO TBCR
40.0000 meq | EXTENDED_RELEASE_TABLET | Freq: Once | ORAL | Status: AC
  Administered 2014-12-01: 40 meq via ORAL
  Filled 2014-12-01: qty 2

## 2014-12-01 MED ORDER — SODIUM CHLORIDE 0.9 % IV SOLN: 250.0000 mL | INTRAVENOUS | Status: DC | PRN

## 2014-12-01 MED ORDER — SODIUM CHLORIDE 0.9 % IJ SOLN: 3.0000 mL | Freq: Two times a day (BID) | INTRAMUSCULAR | Status: DC

## 2014-12-01 MED ORDER — LIDOCAINE VISCOUS 2 % MT SOLN
OROMUCOSAL | Status: AC
  Filled 2014-12-01: qty 15

## 2014-12-01 MED ORDER — SODIUM CHLORIDE 0.9 % IJ SOLN: 3.0000 mL | INTRAMUSCULAR | Status: DC | PRN

## 2014-12-01 MED ORDER — FENTANYL CITRATE (PF) 100 MCG/2ML IJ SOLN
50.0000 ug | INTRAMUSCULAR | Status: AC | PRN
  Administered 2014-12-01 (×2): 50 ug via INTRAVENOUS

## 2014-12-01 MED ORDER — SODIUM CHLORIDE 0.9 % WEIGHT BASED INFUSION
3.0000 mL/kg/h | INTRAVENOUS | Status: AC
Start: 2014-12-02 — End: 2014-12-02
  Administered 2014-12-02: 3 mL/kg/h via INTRAVENOUS

## 2014-12-01 MED ORDER — FENTANYL CITRATE (PF) 100 MCG/2ML IJ SOLN
INTRAMUSCULAR | Status: AC
  Filled 2014-12-01: qty 2

## 2014-12-01 MED ORDER — FENTANYL CITRATE (PF) 100 MCG/2ML IJ SOLN
INTRAMUSCULAR | Status: DC
Start: 2014-12-01 — End: 2014-12-01
  Filled 2014-12-01: qty 2

## 2014-12-01 MED ORDER — MIDAZOLAM HCL 5 MG/5ML IJ SOLN
INTRAMUSCULAR | Status: AC
Start: 2014-12-01 — End: 2014-12-01
  Filled 2014-12-01: qty 10

## 2014-12-01 MED ORDER — SODIUM CHLORIDE 0.9 % WEIGHT BASED INFUSION
1.0000 mL/kg/h | INTRAVENOUS | Status: DC
  Administered 2014-12-02: 1 mL/kg/h via INTRAVENOUS

## 2014-12-01 MED ORDER — SODIUM CHLORIDE 0.9 % WEIGHT BASED INFUSION: 1.0000 mL/kg/h | INTRAVENOUS | Status: DC

## 2014-12-01 MED ORDER — SODIUM CHLORIDE 0.9 % WEIGHT BASED INFUSION
3.0000 mL/kg/h | INTRAVENOUS | Status: DC
Start: 2014-12-02 — End: 2014-12-01

## 2014-12-01 MED ORDER — SODIUM CHLORIDE 0.9 % IV SOLN: INTRAVENOUS | Status: DC

## 2014-12-01 MED ORDER — BUTAMBEN-TETRACAINE-BENZOCAINE 2-2-14 % EX AERO
INHALATION_SPRAY | CUTANEOUS | Status: AC
  Filled 2014-12-01: qty 20

## 2014-12-01 MED ORDER — SODIUM CHLORIDE 0.9 % IJ SOLN
3.0000 mL | Freq: Two times a day (BID) | INTRAMUSCULAR | Status: DC
  Administered 2014-12-01 (×2): 3 mL via INTRAVENOUS

## 2014-12-01 MED ORDER — MIDAZOLAM HCL 5 MG/ML IJ SOLN
2.0000 mg | INTRAMUSCULAR | Status: AC | PRN
  Administered 2014-12-01 (×2): 2 mg via INTRAVENOUS

## 2014-12-01 MED ORDER — ASPIRIN 81 MG PO CHEW
81.0000 mg | CHEWABLE_TABLET | ORAL | Status: AC
  Administered 2014-12-02: 81 mg via ORAL
  Filled 2014-12-01: qty 1

## 2014-12-01 NOTE — Progress Notes (Signed)
[facility] [place] [facility]   PATIENT NAME: Dennis PeersWilliam Curry    MR#:  [id number]  DATE OF BIRTH:  1965-07-19  SUBJECTIVE:  CHIEF COMPLAINT:  better shortness of breath and cough. Chief Complaint  Patient presents with  . Shortness of Breath    Pt presents to ER alert and in NAD. Pt reports increasing SOB at night when laying down. Pt states it feels like a lot of mucus in his nose. Pt has seen ENT for this issue.    REVIEW OF SYSTEMS:  CONSTITUTIONAL: No fever, fatigue or weakness.  EYES: No blurred or double vision.  EARS, NOSE, AND THROAT: No tinnitus or ear pain.  RESPIRATORY: Positive cough and shortness of breath, no wheezing or hemoptysis.  CARDIOVASCULAR: No chest pain, orthopnea, edema.  GASTROINTESTINAL: No nausea, vomiting, diarrhea or abdominal pain.  GENITOURINARY: No dysuria, hematuria.  ENDOCRINE: No polyuria, nocturia,  HEMATOLOGY: No anemia, easy bruising or bleeding SKIN: No rash or lesion. MUSCULOSKELETAL: No joint pain or arthritis.   NEUROLOGIC: No tingling, numbness, weakness.  PSYCHIATRY: No anxiety or depression.   DRUG ALLERGIES:  No Known Allergies  VITALS:  Blood pressure 150/92, pulse 85, temperature 98.4 F (36.9 C), temperature source Oral, resp. rate 16, height 6' (1.829 m), weight 97.977 kg (216 lb), SpO2 97 %.  PHYSICAL EXAMINATION:  GENERAL:  49 y.o.-year-old patient lying in the bed with no acute distress.  EYES: Pupils equal, round, reactive to light and accommodation. No scleral icterus. Extraocular muscles intact.  HEENT: Head atraumatic, normocephalic. Oropharynx and nasopharynx clear.  NECK:  Supple, no jugular venous distention. No thyroid enlargement, no tenderness.  LUNGS: Normal breath sounds bilaterally, no wheezing, mild bilateral basilar rales, no rhonchi or crepitation. No use of accessory muscles of respiration.  CARDIOVASCULAR: S1, S2 normal. No murmurs, rubs, or gallops.  ABDOMEN: Soft,  nontender, nondistended. Bowel sounds present. No organomegaly or mass.  EXTREMITIES: No lower extremity edema, no cyanosis, or clubbing.  NEUROLOGIC: Cranial nerves II through XII are intact. Muscle strength 5/5 in all extremities. Sensation intact. Gait not checked.  PSYCHIATRIC: The patient is alert and oriented x 3.  SKIN: No obvious rash, lesion, or ulcer.    LABORATORY PANEL:   CBC  Recent Labs Lab 11/30/14 0121  WBC 11.5*  HGB 13.7  HCT 39.7*  PLT 233   ------------------------------------------------------------------------------------------------------------------  Chemistries   Recent Labs Lab 12/01/14 1126  NA 137  K 3.2*  CL 101  CO2 27  GLUCOSE 191*  BUN 19  CREATININE 0.82  CALCIUM 7.8*   ------------------------------------------------------------------------------------------------------------------  Cardiac Enzymes  Recent Labs Lab 11/30/14 1736  TROPONINI 0.10*   ------------------------------------------------------------------------------------------------------------------  RADIOLOGY:  Dg Chest 2 View  11/30/2014   CLINICAL DATA:  Shortness of breath for 2 months, worse at night.  EXAM: CHEST  2 VIEW  COMPARISON:  None.  FINDINGS: There is mild cardiomegaly. Aortic and hilar contours are negative. Few Kerley A and B-lines noted. Trace posterior pleural fluid. No pneumonia. No pneumothorax. No acute osseous findings.  IMPRESSION: Cardiomegaly and minimal interstitial edema.   Electronically Signed   By: Marnee SpringJonathon  Watts M.D.   On: 11/30/2014 02:10    EKG:   Orders placed or performed during the hospital encounter of 11/30/14  . ED EKG (<3610mins upon arrival to the ED)  . ED EKG (<5710mins upon arrival to the ED)  . EKG 12-Lead  . EKG 12-Lead    ASSESSMENT AND PLAN:   This is a 49 year old male  admitted for NSTEMI. 1. Acute on likely chronic systolic CHF: Continue Lasix IV, lisinopril and Coreg. echocardiogram show EF 25-30%, There was  moderate to severe aortic valve regurgitation.   * Aortic valve disorder/dilated aorta (aneursym): Status post TEE.  2. Elevated troponin. demand ischemia in the setting of acute CHF and accelerated HTN /NSTEMI: Per cardiology consult, continue aspirin, lisinopril, Coreg and Lipitor, scheduled Cardiac Catheter Tomorrow. 3. Hypertension: Tinea carvedilol and lisinopril, and Lasix. 4. Diabetes mellitus type 2: Continue sliding scale insulin.  Hypokalemia. Potassium supplement and follow-up BMP and magnesium.    All the records are reviewed and case discussed with Care Management/Social Workerr. Management plans discussed with the patient, nurse, social worker and cardiology PA.  CODE STATUS: Full code  TOTAL TIME TAKING CARE OF THIS PATIENT: 42 minutes.   POSSIBLE D/C IN 2 DAYS, DEPENDING ON CLINICAL CONDITION.   Shaune Pollack M.D on 12/01/2014 at 2:47 PM  Between 7am to 6pm - Pager - [phone]  After 6pm go to [email or web address] - password [email or web address]  [place] [facility]  Office  [phone]  CC: Primary care physician; No PCP Per Patient

## 2014-12-01 NOTE — Progress Notes (Signed)
*  PRELIMINARY RESULTS* Echocardiogram Echocardiogram Transesophageal has been performed.  Dennis Curry 12/01/2014, 9:24 AM

## 2014-12-01 NOTE — Care Management (Addendum)
Cardiology indicates there are concerns performing cardiac cath. TEE schedfuled for today to evaluate cardiac valves

## 2014-12-01 NOTE — Discharge Instructions (Addendum)
2 gram sodium, low fat and ADA diet. Check Daily weight. Daily fluids < 2 liters. DIET:  Low fat, Low cholesterol diet  DISCHARGE CONDITION:  Fair  ACTIVITY:  Activity as tolerated  OXYGEN:  Home Oxygen: No.   Oxygen Delivery: room air  DISCHARGE LOCATION:  home   If you experience worsening of your admission symptoms, develop shortness of breath, life threatening emergency, suicidal or homicidal thoughts you must seek medical attention immediately by calling 911 or calling your MD immediately  if symptoms less severe.  You Must read complete instructions/literature along with all the possible adverse reactions/side effects for all the Medicines you take and that have been prescribed to you. Take any new Medicines after you have completely understood and accpet all the possible adverse reactions/side effects.   Please note  You were cared for by a hospitalist during your hospital stay. If you have any questions about your discharge medications or the care you received while you were in the hospital after you are discharged, you can call the unit and asked to speak with the hospitalist on call if the hospitalist that took care of you is not available. Once you are discharged, your primary care physician will handle any further medical issues. Please note that NO REFILLS for any discharge medications will be authorized once you are discharged, as it is imperative that you return to your primary care physician (or establish a relationship with a primary care physician if you do not have one) for your aftercare needs so that they can reassess your need for medications and monitor your lab values.   Activity as tolerated. Follow up Dr. Mariah MillingGollan.

## 2014-12-01 NOTE — Progress Notes (Signed)
Patient: Dennis Curry / Admit Date: 11/30/2014 / Date of Encounter: 12/01/2014, 8:36 AM   Subjective: Improving SOB, still with congestion. Able to lay flat. For TEE today to evaluate the aortic valve  Gfeller cough (was getting worse the past few days before admission)  Review of Systems: Review of Systems  Constitutional: Negative.   Respiratory: Positive for cough and shortness of breath.   Cardiovascular: Negative.   Gastrointestinal: Negative.   Musculoskeletal: Negative.   Neurological: Negative.   Psychiatric/Behavioral: Negative.    All other systems reviewed and negative.   Objective: Telemetry: NSR with rate 95 bpm Physical Exam: Blood pressure 146/95, pulse 96, temperature 98.4 F (36.9 C), temperature source Oral, resp. rate 18, height 6' (1.829 m), weight 97.977 kg (216 lb), SpO2 95 %. Body mass index is 29.29 kg/(m^2). General: Well developed, well nourished, in no acute distress. Head: Normocephalic, atraumatic, sclera non-icteric, no xanthomas, nares are without discharge. Neck: Negative for carotid bruits. JVP not elevated. Lungs: Clear bilaterally to auscultation without wheezes, rales, or rhonchi. Breathing is unlabored. Heart: RRR S1 S2 without murmurs, rubs, or gallops.  Abdomen: Soft, non-tender, non-distended with normoactive bowel sounds. No rebound/guarding. Extremities: No clubbing or cyanosis. No edema. Distal pedal pulses are 2+ and equal bilaterally. Neuro: Alert and oriented X 3. Moves all extremities spontaneously. Psych:  Responds to questions appropriately with a normal affect.   Intake/Output Summary (Last 24 hours) at 12/01/14 0836 Last data filed at 12/01/14 0518  Gross per 24 hour  Intake    120 ml  Output   2000 ml  Net  -1880 ml    Inpatient Medications:  . atorvastatin  80 mg Oral Q0600  . butamben-tetracaine-benzocaine      . carvedilol  3.125 mg Oral BID WC  . fentaNYL      . fentaNYL      . furosemide  20 mg Intravenous  BID  . heparin  5,000 Units Subcutaneous 3 times per day  . insulin aspart  0-5 Units Subcutaneous QHS  . insulin aspart  0-9 Units Subcutaneous TID WC  . lidocaine      . lisinopril  5 mg Oral Daily  . midazolam      . potassium chloride  10 mEq Oral BID  . sodium chloride  3 mL Intravenous Q12H   Infusions:  . sodium chloride    . [START ON 12/02/2014] sodium chloride     Followed by  . [START ON 12/02/2014] sodium chloride      Labs:  Recent Labs  11/30/14 0121  NA 138  K 3.1*  CL 106  CO2 24  GLUCOSE 186*  BUN 16  CREATININE 0.92  CALCIUM 9.1   No results for input(s): AST, ALT, ALKPHOS, BILITOT, PROT, ALBUMIN in the last 72 hours.  Recent Labs  11/30/14 0121  WBC 11.5*  HGB 13.7  HCT 39.7*  MCV 81.0  PLT 233    Recent Labs  11/30/14 0121 11/30/14 0608 11/30/14 1109 11/30/14 1736  TROPONINI 0.18* 0.14* 0.15* 0.10*   Invalid input(s): POCBNP  Recent Labs  11/30/14 1109  HGBA1C 7.4*     Weights: Filed Weights   11/30/14 0111 11/30/14 0536 12/01/14 0518  Weight: 101.152 kg (223 lb) 98.567 kg (217 lb 4.8 oz) 97.977 kg (216 lb)     Radiology/Studies:  Dg Chest 2 View  11/30/2014   CLINICAL DATA:  Shortness of breath for 2 months, worse at night.  EXAM: CHEST  2 VIEW  COMPARISON:  None.  FINDINGS: There is mild cardiomegaly. Aortic and hilar contours are negative. Few Kerley A and B-lines noted. Trace posterior pleural fluid. No pneumonia. No pneumothorax. No acute osseous findings.  IMPRESSION: Cardiomegaly and minimal interstitial edema.   Electronically Signed   By: Marnee Spring M.D.   On: 11/30/2014 02:10     Assessment and Plan  49 y.o. male  with h/o DM2 and HTN who presented to [facility] on 5/[facility] with a 1 month history of progressively worsening nasal congestion, post nasal drip, cough productive of clear sputum, and SOB. He was found to have an elevated troponin of 0.18-->0.14 and CXR with minimal interstitial edema.   1. Elevated  troponin: -Question of supply demand ischemia in the setting of acute CHF and accelerated HTN vs NSTEMI Depressed EF on echo -He will need an ischemic evaluation given his symptoms of increasing SOB, PND, and DM2 Continue Coreg, increase up to 6.25 mg po BID  Lipitor 80 mg -Continue aspirin 81 mg ----cardiac cath will be very difficult given his very dilated aorta (may have to do in [place]/[facility] or as an outpt with Dr. Kirke Corin)  2. SOB: Acute systolic CHF, evaluate for ischemia  -Continue gentle diuresis today  3) Aortic valve disorder/dilated aorta (aneursym) ---Given his dilated aorta/valve disorder, will need TEE (today) --Will likely need surgery if bicuspid aortic valve and >5 cm aorta  4. HTN: -Presented uncontrolled -Improving -Add Coreg and Lasix as above   Signed, Dossie Arbour 12/01/2014, 8:36 AM

## 2014-12-02 ENCOUNTER — Other Ambulatory Visit: Payer: Self-pay | Admitting: Cardiovascular Disease

## 2014-12-02 ENCOUNTER — Encounter: Payer: Self-pay | Admitting: Physician Assistant

## 2014-12-02 ENCOUNTER — Encounter
Admission: EM | Disposition: A | Discharge status: Home / Self Care | Payer: Managed Care, Other (non HMO) | Source: Home / Self Care | Attending: Internal Medicine

## 2014-12-02 DIAGNOSIS — I5021 Acute systolic (congestive) heart failure: Secondary | ICD-10-CM | POA: Diagnosis present

## 2014-12-02 DIAGNOSIS — E119 Type 2 diabetes mellitus without complications: Secondary | ICD-10-CM

## 2014-12-02 DIAGNOSIS — I420 Dilated cardiomyopathy: Secondary | ICD-10-CM | POA: Diagnosis present

## 2014-12-02 HISTORY — PX: CARDIAC CATHETERIZATION: SHX172

## 2014-12-02 LAB — BASIC METABOLIC PANEL
Anion gap: 7 (ref 5–15)
BUN: 22 mg/dL — AB (ref 6–20)
CO2: 27 mmol/L (ref 22–32)
Calcium: 8.6 mg/dL — ABNORMAL LOW (ref 8.9–10.3)
Chloride: 105 mmol/L (ref 101–111)
Creatinine, Ser: 0.88 mg/dL (ref 0.61–1.24)
GFR calc Af Amer: >60 mL/min (ref >60)
GFR calc non Af Amer: >60 mL/min (ref >60)
GLUCOSE: 175 mg/dL — AB (ref 65–99)
Potassium: 3.4 mmol/L — ABNORMAL LOW (ref 3.5–5.1)
Sodium: 139 mmol/L (ref 135–145)

## 2014-12-02 LAB — CBC
HEMATOCRIT: 37.6 % — AB (ref 40.0–52.0)
HEMOGLOBIN: 12.4 g/dL — AB (ref 13.0–18.0)
MCH: 27.3 pg (ref 26.0–34.0)
MCHC: 32.9 g/dL (ref 32.0–36.0)
MCV: 82.9 fL (ref 80.0–100.0)
Platelets: 226 10*3/uL (ref 150–440)
RBC: 4.53 MIL/uL (ref 4.40–5.90)
RDW: 14.4 % (ref 11.5–14.5)
WBC: 11.1 10*3/uL — ABNORMAL HIGH (ref 3.8–10.6)

## 2014-12-02 LAB — PROTIME-INR
INR: 1.12
PROTHROMBIN TIME: 14.6 s (ref 11.4–15.0)

## 2014-12-02 LAB — GLUCOSE, CAPILLARY
Glucose-Capillary: 162 mg/dL — ABNORMAL HIGH (ref 65–99)
Glucose-Capillary: 168 mg/dL — ABNORMAL HIGH (ref 65–99)

## 2014-12-02 SURGERY — LEFT HEART CATH AND CORONARY ANGIOGRAPHY
Anesthesia: Moderate Sedation

## 2014-12-02 MED ORDER — POTASSIUM CHLORIDE ER 10 MEQ PO TBCR: 20.0000 meq | EXTENDED_RELEASE_TABLET | Freq: Every day | ORAL | Status: DC

## 2014-12-02 MED ORDER — LISINOPRIL 5 MG PO TABS: 5.0000 mg | ORAL_TABLET | Freq: Every day | ORAL | Status: DC

## 2014-12-02 MED ORDER — SODIUM CHLORIDE 0.9 % IV SOLN: INTRAVENOUS | Status: AC

## 2014-12-02 MED ORDER — SODIUM CHLORIDE 0.9 % IJ SOLN: 3.0000 mL | Freq: Two times a day (BID) | INTRAMUSCULAR | Status: DC

## 2014-12-02 MED ORDER — FUROSEMIDE 10 MG/ML IJ SOLN
INTRAMUSCULAR | Status: AC
  Filled 2014-12-02: qty 2

## 2014-12-02 MED ORDER — FENTANYL CITRATE (PF) 100 MCG/2ML IJ SOLN
INTRAMUSCULAR | Status: DC | PRN
  Administered 2014-12-02: 50 ug via INTRAVENOUS

## 2014-12-02 MED ORDER — CARVEDILOL 3.125 MG PO TABS: 3.1250 mg | ORAL_TABLET | Freq: Two times a day (BID) | ORAL | Status: DC

## 2014-12-02 MED ORDER — MIDAZOLAM HCL 2 MG/2ML IJ SOLN
INTRAMUSCULAR | Status: AC
  Filled 2014-12-02: qty 2

## 2014-12-02 MED ORDER — ONDANSETRON HCL 40 MG/20ML IJ SOLN: 4.0000 mg | Freq: Four times a day (QID) | INTRAMUSCULAR | Status: DC | PRN

## 2014-12-02 MED ORDER — FUROSEMIDE 40 MG PO TABS: 40.0000 mg | ORAL_TABLET | Freq: Two times a day (BID) | ORAL | Status: DC

## 2014-12-02 MED ORDER — ATORVASTATIN CALCIUM 80 MG PO TABS: 40.0000 mg | ORAL_TABLET | Freq: Every day | ORAL | Status: DC

## 2014-12-02 MED ORDER — MIDAZOLAM HCL 2 MG/2ML IJ SOLN
INTRAMUSCULAR | Status: DC | PRN
  Administered 2014-12-02: 1 mg via INTRAVENOUS

## 2014-12-02 MED ORDER — HEPARIN (PORCINE) IN NACL 2-0.9 UNIT/ML-% IJ SOLN
INTRAMUSCULAR | Status: AC
  Filled 2014-12-02: qty 500

## 2014-12-02 MED ORDER — SODIUM CHLORIDE 0.9 % IJ SOLN: 3.0000 mL | INTRAMUSCULAR | Status: DC | PRN

## 2014-12-02 MED ORDER — FENTANYL CITRATE (PF) 100 MCG/2ML IJ SOLN
INTRAMUSCULAR | Status: AC
  Filled 2014-12-02: qty 2

## 2014-12-02 MED ORDER — IOHEXOL 300 MG/ML  SOLN
INTRAMUSCULAR | Status: DC | PRN
  Administered 2014-12-02: 30 mL via INTRAVENOUS
  Administered 2014-12-02: 100 mL via INTRAVENOUS

## 2014-12-02 MED ORDER — SODIUM CHLORIDE 0.9 % IV SOLN: 250.0000 mL | INTRAVENOUS | Status: DC | PRN

## 2014-12-02 MED ORDER — ACETAMINOPHEN 325 MG PO TABS: 650.0000 mg | ORAL_TABLET | ORAL | Status: AC | PRN

## 2014-12-02 SURGICAL SUPPLY — 11 items
CATH INFINITI 5 FR JR5 (CATHETERS) ×3 IMPLANT
CATH INFINITI 5FR ANG PIGTAIL (CATHETERS) ×3 IMPLANT
CATH INFINITI 5FR JL4 (CATHETERS) ×3 IMPLANT
CATH INFINITI 5FR JL5 (CATHETERS) ×3 IMPLANT
CATH INFINITI JR4 5F (CATHETERS) ×3 IMPLANT
DEVICE CLOSURE MYNXGRIP 5F (Vascular Products) ×3 IMPLANT
KIT MANI 3VAL PERCEP (MISCELLANEOUS) ×3 IMPLANT
NEEDLE PERC 18GX7CM (NEEDLE) ×3 IMPLANT
PACK CARDIAC CATH (CUSTOM PROCEDURE TRAY) ×3 IMPLANT
SHEATH AVANTI 5FR X 11CM (SHEATH) ×3 IMPLANT
WIRE EMERALD 3MM-J .035X150CM (WIRE) ×3 IMPLANT

## 2014-12-02 NOTE — Progress Notes (Signed)
Patient given discharge teaching and paperwork regarding medications, vascular site access, diet, follow-up appointments and activity. Patient understanding verbalized. No complaints at this time. IV and telemetry discontinued. Skin assessment as previously charted and vitals are stable. Patient being discharged to home. Caregiver/family present during discharge teaching.  Adella NissenBailey, Izzy Doubek G

## 2014-12-02 NOTE — Progress Notes (Signed)
Patient: Dennis Curry / Admit Date: 11/30/2014 / Date of Encounter: 12/02/2014, 1:25 PM   Subjective: Improving SOB,  Cardiac catheterization today with mild luminal irregularities, no occlusive CAD Dilated cardiopathy, nonischemic, etiology unclear Overall he feels well with no complaints. Breathing much improved on current medication regimen   Review of Systems: Review of Systems  Constitutional: Negative.   Respiratory: Positive for shortness of breath.   Cardiovascular: Negative.   Gastrointestinal: Negative.   Musculoskeletal: Negative.   Neurological: Negative.   All other systems reviewed and negative.   Objective: Telemetry: NSR  Physical Exam: Blood pressure 140/83, pulse 87, temperature 97.4 F (36.3 C), temperature source Oral, resp. rate 24, height 6' (1.829 m), weight 98.294 kg (216 lb 11.2 oz), SpO2 96 %. Body mass index is 29.38 kg/(m^2). General: Well developed, well nourished, in no acute distress. Head: Normocephalic, atraumatic, sclera non-icteric, no xanthomas, nares are without discharge. Neck: Negative for carotid bruits. JVP not elevated. Lungs: Clear bilaterally to auscultation without wheezes, rales, or rhonchi. Breathing is unlabored. Heart: RRR S1 S2 without murmurs, rubs, or gallops.  Abdomen: Soft, non-tender, non-distended with normoactive bowel sounds. No rebound/guarding. Extremities: No clubbing or cyanosis. No edema. Distal pedal pulses are 2+ and equal bilaterally. Neuro: Alert and oriented X 3. Moves all extremities spontaneously. Psych:  Responds to questions appropriately with a normal affect.   Intake/Output Summary (Last 24 hours) at 12/02/14 1325 Last data filed at 12/02/14 0900  Gross per 24 hour  Intake 621.93 ml  Output   4070 ml  Net -3448.07 ml    Inpatient Medications:  . atorvastatin  80 mg Oral Q0600  . carvedilol  3.125 mg Oral BID WC  . furosemide  20 mg Intravenous BID  . heparin  5,000 Units Subcutaneous 3 times  per day  . insulin aspart  0-5 Units Subcutaneous QHS  . insulin aspart  0-9 Units Subcutaneous TID WC  . lisinopril  5 mg Oral Daily  . potassium chloride  10 mEq Oral BID  . sodium chloride  3 mL Intravenous Q12H  . sodium chloride  3 mL Intravenous Q12H   Infusions:  . sodium chloride 1 mL/kg/hr (12/02/14 0717)    Labs:  Recent Labs  12/01/14 1126 12/01/14 1516 12/02/14 0337  NA 137  --  139  K 3.2*  --  3.4*  CL 101  --  105  CO2 27  --  27  GLUCOSE 191*  --  175*  BUN 19  --  22*  CREATININE 0.82  --  0.88  CALCIUM 7.8*  --  8.6*  MG  --  1.8  --    No results for input(s): AST, ALT, ALKPHOS, BILITOT, PROT, ALBUMIN in the last 72 hours.  Recent Labs  11/30/14 0121 12/02/14 0337  WBC 11.5* 11.1*  HGB 13.7 12.4*  HCT 39.7* 37.6*  MCV 81.0 82.9  PLT 233 226    Recent Labs  11/30/14 0121 11/30/14 0608 11/30/14 1109 11/30/14 1736  TROPONINI 0.18* 0.14* 0.15* 0.10*   Invalid input(s): POCBNP  Recent Labs  11/30/14 1109  HGBA1C 7.4*     Weights: Filed Weights   11/30/14 0536 12/01/14 0518 12/02/14 0526  Weight: 98.567 kg (217 lb 4.8 oz) 97.977 kg (216 lb) 98.294 kg (216 lb 11.2 oz)     Radiology/Studies:  Dg Chest 2 View  11/30/2014   CLINICAL DATA:  Shortness of breath for 2 months, worse at night.  EXAM: CHEST  2 VIEW  COMPARISON:  None.  FINDINGS: There is mild cardiomegaly. Aortic and hilar contours are negative. Few Kerley A and B-lines noted. Trace posterior pleural fluid. No pneumonia. No pneumothorax. No acute osseous findings.  IMPRESSION: Cardiomegaly and minimal interstitial edema.   Electronically Signed   By: Marnee Spring M.D.   On: 11/30/2014 02:10     Assessment and Plan  49 y.o. male  with h/o DM2 and HTN who presented to [facility] on 5/22 with a 1 month history of progressively worsening nasal congestion, post nasal drip, cough productive of clear sputum, and SOB. He was found to have an elevated troponin of 0.18-->0.14 and CXR with  minimal interstitial edema.   1. Dilated cardiopathy, nonischemic No alcohol, etiology unclear Cardiac cath today showing no significant CAD (no occlusive disease, only mild luminal irregularities) EF continues to run 25% --Would continue data blocker, ACE inhibitor,  could add low-dose spironolactone -----If ambulating later this afternoon and feeling well, could discharge with close follow-up in clinic  2. SOB: Acute systolic CHF, nonischemic Would continue medications as above Would change Lasix to 20 mg by mouth twice a day with potassium supplement daily Add spironolactone  3) Aortic valve disorder/dilated aorta (aneursym) TEE yesterday showing mild to moderate AI, moderately dilated aortic root and ascending aorta No indication for surgery at this time.  Will need periodic echocardiogram once a year or every other year. We will arrange from clinic  4. HTN: -Presented uncontrolled Improved Continue medications as above   Signed, Dossie Arbour 12/02/2014, 1:25 PM

## 2014-12-02 NOTE — Progress Notes (Signed)
Inpatient Diabetes Program Recommendations  AACE/ADA: New Consensus Statement on Inpatient Glycemic Control (2013)  Target Ranges:  Prepandial:   less than 140 mg/dL      Peak postprandial:   less than 180 mg/dL (1-2 hours)      Critically ill patients:  140 - 180 mg/dL   Reason for Assessment:  Results for Dennis Curry, Dennis Curry (MRN [id number]) as of 12/02/2014 12:59  Ref. Range 12/01/2014 11:35 12/01/2014 16:02 12/01/2014 21:16 12/02/2014 07:17 12/02/2014 11:35  Glucose-Capillary Latest Ref Range: 65-99 mg/dL 409189 (H) 811187 (H) 914189 (H) 162 (H) 168 (H)  Results for Dennis Curry, Dennis Curry (MRN [id number]) as of 12/02/2014 12:59  Ref. Range 11/30/2014 11:09  Hemoglobin A1C Latest Ref Range: 4.0-6.0 % 7.4 (H)   Diabetes history: Type 2 diabetes Outpatient Diabetes medications: None Current orders for Inpatient glycemic control:  Novolog sensitive tid with meals and HS  May consider increasing Novolog correction to moderate tid with meals.  Also may consider adding low dose basal, Lantus 15 units daily while in the hospital.  Based on A1C, patient would likely benefit from start of oral diabetes medication at discharge in order to get A1C to goal.  Thanks, Beryl MeagerJenny Skarlet Lyons, RN, BC-ADM Inpatient Diabetes Coordinator Pager [phone] (8a-5p)

## 2014-12-03 NOTE — Discharge Summary (Signed)
[facility]   PATIENT NAME: Dennis Curry    MR#:  [id number]  DATE OF BIRTH:  April 18, 1966  DATE OF ADMISSION:  11/30/2014 ADMITTING PHYSICIAN: Arnaldo NatalMichael S Diamond, MD  DATE OF DISCHARGE: 12/02/2014  3:11 PM  PRIMARY CARE PHYSICIAN: No PCP Per Patient    ADMISSION DIAGNOSIS:  Dyspnea [R06.00] Heart failure, unspecified heart failure chronicity, unspecified heart failure type [I50.9]  DISCHARGE DIAGNOSIS:  Principal Problem:   Shortness of breath Active Problems:   Essential hypertension   Acute systolic heart failure   Aortic valve disorder   Dyspnea   Aneurysm, ascending aorta   Congestive dilated cardiomyopathy   Acute systolic CHF (congestive heart failure)   Diabetes   Nonischemic dilated cardiomyopathy   SECONDARY DIAGNOSIS:   Past Medical History  Diagnosis Date  . Diabetes mellitus without complication   . Hypertension   . Aortic insufficiency     a. TTE 11/30/14: EF 25-30%, bicupid valve cannot be excluded, mod-severe aortic regurge, aortic root dimension 52 mm, mild MR, mildly dilated LA/RA, PASP 60; TEE 12/01/14: EF 25-30%, diffuse HK, aortic valve trileaflet, mild to mod Ao regurge, ascending aortic grossly measured at 4.5 cm (not well visualized),   . Nonischemic dilated cardiomyopathy     a. cath 12/02/14: right dom coronary system w/ mild lumenal irregs, o/w no sig CAD, sev diffuse HK EF 25%, no sig MR or AS, med Rx recommeded   . Chronic systolic CHF (congestive heart failure)     a. TTE 11/30/14: EF 25-30%, bicupid valve cannot be excluded, mod-severe aortic regurge, aortic root dimension 52 mm, mild MR, mildly dilated LA/RA, PASP 60; TEE 12/01/14: EF 25-30%, diffuse HK, aortic valve trileaflet, mild to mod Ao regurge, ascending aortic grossly measured at 4.5 cm (not well visualized),   . Ascending aorta dilatation     a. 52 mm by TTE 11/2014; b. 45 mm by TEE 11/2014 (not well visualized)    HOSPITAL COURSE:    ADMITTING HISTORY Dennis Curry is an 49 y.o. male.  Chief Complaint: Shortness of breath HPI: She presents to the emergency department complaining of shortness of breath. He has become progressively more dyspneic over the last few weeks. He mentions that he was diagnosed with bronchitis and a possible sinus infection approximately one month ago at which time his cough began. Since that time he has noticed he's had a lot of phlegm production at night which she can "hear rattling in his chest". He admits to a few episodes of posttussive emesis. It has been nonbloody and nonbilious. He denies any chest pain the symptoms. He underwent an echocardiogram and stress test in 2010 which is reportedly negative. In the emergency department he was found to be hypertensive and tachycardic. Initial troponin was elevated at 0.18 which prompted emergency department to call for admission  HOSPITAL COURSE This is a 49 year old male admitted for NSTEMI. * Acute on likely chronic systolic CHF: Continue Lasix IV, lisinopril and Coreg. echocardiogram show EF 25-30%,  Cardiology f/u with Dr. Mariah MillingGollan after discharge.  * Aortic valve disorder/dilated aorta (aneursym): Status post TEE.  * Elevated troponin. demand ischemia in the setting of acute CHF and accelerated HTN. Cardiac catheterization showed no significant CAD.  * Hypertension: Tinea carvedilol and lisinopril, and Lasix.  * Diabetes mellitus type 2: Continue sliding scale insulin.   * Hypokalemia. Potassium supplements.   CONSULTS OBTAINED:  Treatment Team:  Iran OuchMuhammad A Arida, MD  DRUG ALLERGIES:  No Known Allergies  DISCHARGE MEDICATIONS:   Discharge Medication List as of 12/02/2014  2:45 PM    START taking these medications   Details  atorvastatin (LIPITOR) 80 MG tablet Take 0.5 tablets (40 mg total) by mouth daily at 6 (six) AM., Starting 12/02/2014, Until Discontinued, Print    carvedilol (COREG) 3.125 MG tablet Take 1 tablet (3.125 mg  total) by mouth 2 (two) times daily with a meal., Starting 12/02/2014, Until Discontinued, Print    furosemide (LASIX) 40 MG tablet Take 1 tablet (40 mg total) by mouth 2 (two) times daily., Starting 12/02/2014, Until Discontinued, Print    lisinopril (PRINIVIL,ZESTRIL) 5 MG tablet Take 1 tablet (5 mg total) by mouth daily., Starting 12/02/2014, Until Discontinued, Normal    potassium chloride (K-DUR) 10 MEQ tablet Take 2 tablets (20 mEq total) by mouth daily., Starting 12/02/2014, Until Discontinued, Print         DISCHARGE INSTRUCTIONS:   DIET:  Cardiac diet  DISCHARGE CONDITION:  Stable  ACTIVITY:  Activity as tolerated  OXYGEN:  Home Oxygen: No.   Oxygen Delivery: room air  DISCHARGE LOCATION:  home   If you experience worsening of your admission symptoms, develop shortness of breath, life threatening emergency, suicidal or homicidal thoughts you must seek medical attention immediately by calling 911 or calling your MD immediately  if symptoms less severe.  You Must read complete instructions/literature along with all the possible adverse reactions/side effects for all the Medicines you take and that have been prescribed to you. Take any new Medicines after you have completely understood and accpet all the possible adverse reactions/side effects.   Please note  You were cared for by a hospitalist during your hospital stay. If you have any questions about your discharge medications or the care you received while you were in the hospital after you are discharged, you can call the unit and asked to speak with the hospitalist on call if the hospitalist that took care of you is not available. Once you are discharged, your primary care physician will handle any further medical issues. Please note that NO REFILLS for any discharge medications will be authorized once you are discharged, as it is imperative that you return to your primary care physician (or establish a relationship with a  primary care physician if you do not have one) for your aftercare needs so that they can reassess your need for medications and monitor your lab values.     Today    VITAL SIGNS:  Blood pressure 140/83, pulse 87, temperature 97.4 F (36.3 C), temperature source Oral, resp. rate 24, height 6' (1.829 m), weight 98.294 kg (216 lb 11.2 oz), SpO2 96 %.  I/O:   Intake/Output Summary (Last 24 hours) at 12/03/14 1258 Last data filed at 12/02/14 1445  Gross per 24 hour  Intake      0 ml  Output   1050 ml  Net  -1050 ml    PHYSICAL EXAMINATION:  Physical Exam  GENERAL:  49 y.o.-year-old patient lying in the bed with no acute distress.  LUNGS: Normal breath sounds bilaterally, no wheezing, rales,rhonchi or crepitation. No use of accessory muscles of respiration.  CARDIOVASCULAR: S1, S2 normal. No murmurs, rubs, or gallops.  ABDOMEN: Soft, non-tender, non-distended. Bowel sounds present. No organomegaly or mass.  NEUROLOGIC: Moves all 4 extremities. PSYCHIATRIC: The patient is alert and oriented x 3.  SKIN: No obvious rash, lesion, or ulcer.   DATA REVIEW:   CBC  Recent Labs Lab 12/02/14 0337  WBC 11.1*  HGB 12.4*  HCT 37.6*  PLT 226    Chemistries   Recent Labs Lab 12/01/14 1516 12/02/14 0337  NA  --  139  K  --  3.4*  CL  --  105  CO2  --  27  GLUCOSE  --  175*  BUN  --  22*  CREATININE  --  0.88  CALCIUM  --  8.6*  MG 1.8  --     Cardiac Enzymes  Recent Labs Lab 11/30/14 1736  TROPONINI 0.10*    Microbiology Results  No results found for this or any previous visit.  RADIOLOGY:  No results found.    Management plans discussed with the patient, family and they are in agreement.  CODE STATUS: Full code  TOTAL TIME TAKING CARE OF THIS PATIENT ON DAY OF DISCHARGE: 40 minutes.    Milagros Loll R M.D on 12/03/2014 at 12:58 PM  Between 7am to 6pm - Pager - [phone]  After 6pm go to [email or web address] - password [email or web address] [facility]  [facility]  Office  [phone]  CC: Primary care physician; No PCP Per Patient

## 2014-12-04 ENCOUNTER — Telehealth: Payer: Self-pay

## 2014-12-04 ENCOUNTER — Encounter: Payer: Self-pay | Admitting: Cardiovascular Disease

## 2014-12-04 NOTE — Telephone Encounter (Signed)
Patient contacted regarding discharge from [facility] on 12/02/14.  Patient understands to follow up with Dr. Mariah MillingGollan on 12/24/14 at 7:45 at [facility]. Patient understands discharge instructions? Yes  Patient understands medications and regiment? Yes  Patient understands to bring all medications to this visit? Yes

## 2014-12-04 NOTE — Telephone Encounter (Signed)
-----   Message from Coralee RudSabrina F Gilley sent at 12/02/2014  2:40 PM EDT ----- Regarding: tcm/ph 12/24/2014 7:45 Dr. Mariah MillingGollan

## 2014-12-14 ENCOUNTER — Ambulatory Visit: Payer: Managed Care, Other (non HMO) | Admitting: Internal Medicine

## 2014-12-16 ENCOUNTER — Encounter: Payer: Self-pay | Admitting: Primary Care

## 2014-12-16 ENCOUNTER — Ambulatory Visit (INDEPENDENT_AMBULATORY_CARE_PROVIDER_SITE_OTHER): Payer: Managed Care, Other (non HMO) | Admitting: Primary Care

## 2014-12-16 VITALS — BP 148/82 | HR 76 | Temp 98.6°F | Ht 72.0 in | Wt 221.4 lb

## 2014-12-16 DIAGNOSIS — E785 Hyperlipidemia, unspecified: Secondary | ICD-10-CM

## 2014-12-16 DIAGNOSIS — E119 Type 2 diabetes mellitus without complications: Secondary | ICD-10-CM | POA: Diagnosis not present

## 2014-12-16 DIAGNOSIS — I5021 Acute systolic (congestive) heart failure: Secondary | ICD-10-CM

## 2014-12-16 DIAGNOSIS — I10 Essential (primary) hypertension: Secondary | ICD-10-CM

## 2014-12-16 DIAGNOSIS — E876 Hypokalemia: Secondary | ICD-10-CM | POA: Diagnosis not present

## 2014-12-16 LAB — BASIC METABOLIC PANEL
BUN: 16 mg/dL (ref 6–23)
CALCIUM: 9.2 mg/dL (ref 8.4–10.5)
CO2: 29 meq/L (ref 19–32)
Chloride: 102 mEq/L (ref 96–112)
Creatinine, Ser: 0.99 mg/dL (ref 0.40–1.50)
GFR: 85.32 mL/min (ref >60.00)
Glucose, Bld: 126 mg/dL — ABNORMAL HIGH (ref 70–99)
Potassium: 3.3 mEq/L — ABNORMAL LOW (ref 3.5–5.1)
SODIUM: 140 meq/L (ref 135–145)

## 2014-12-16 MED ORDER — FUROSEMIDE 40 MG PO TABS: 40.0000 mg | ORAL_TABLET | Freq: Two times a day (BID) | ORAL | Status: DC

## 2014-12-16 MED ORDER — LISINOPRIL 10 MG PO TABS: 10.0000 mg | ORAL_TABLET | Freq: Every day | ORAL | Status: DC

## 2014-12-16 MED ORDER — POTASSIUM CHLORIDE ER 10 MEQ PO TBCR: 20.0000 meq | EXTENDED_RELEASE_TABLET | Freq: Every day | ORAL | Status: DC

## 2014-12-16 MED ORDER — METFORMIN HCL 500 MG PO TABS: 500.0000 mg | ORAL_TABLET | Freq: Two times a day (BID) | ORAL | Status: DC

## 2014-12-16 MED ORDER — ATORVASTATIN CALCIUM 40 MG PO TABS: 40.0000 mg | ORAL_TABLET | Freq: Every day | ORAL | Status: DC

## 2014-12-16 MED ORDER — CARVEDILOL 3.125 MG PO TABS: 3.1250 mg | ORAL_TABLET | Freq: Two times a day (BID) | ORAL | Status: DC

## 2014-12-16 NOTE — Assessment & Plan Note (Signed)
Managed on Lisinopril 5 mg and carvedilol 3.125 mg. BP not at goal today with home readings being 160's-140's /90's. Increase Lisinopril to 10 mg daily. Continue Carvedilol. Recheck BMP today. Follow up in one month

## 2014-12-16 NOTE — Assessment & Plan Note (Signed)
Discovered during May hospitalization. EF 30% Started on Lasix 40 BID. Continue same. Will continue to monitor.

## 2014-12-16 NOTE — Progress Notes (Signed)
Subjective:    Patient ID: Dennis Curry, male    DOB: 04-07-1966, 49 y.o.   MRN: [id number]  HPI  Dennis Curry is a 49 year old male who presents today to establish care, discuss the problems mentioned below, and for hospital follow up. Will obtain old records.  Hospital Follow up: He was treated for a presumed sinus infection at an urgent care several weeks ago which did not improve. He then followed with an allergy specialist who told him he had allergies. He presented to [facility] ED on 11/30/14 with complaints of shortness of breath upon exertion and fatigue. He was noted to have an elevated troponin (NSTEMI). He underwent cardiac catheterization and was found to have no blockages. He also underwent an echocardiogram which found acute systolic heart failure with an EF of 30%. He was released from [facility] on May 25th. Since discharge from the hospital he's feeling very well. Denies shortness of breath and chest pain. He has an appointment with Dr. Mariah Milling (cardiology) on 6/16 for establishment and follow up. He was placed on Lisinopril 5 mg, Carvedilol 3.125 mg, lasix 40 mg BID, and atorvastatin during his hospital stay.  1) Hypertension: Diagnosed in 2010 and was previously managed on Amlodipine. He is currently managed on Lisinopril 5 mg and carvedilol 3.125 since recent hospitalization in May. He's been checking his blood pressure daily since discharge and is getting 160's-140's/90's-80's.   2) Type 2 Diabetes: Diagnosed in 2010 and is managed on metformin 500 mg twice daily. His last A1C was 11/30/14 and was 7.4. Denies numbness/tingling to extremities. He's checking his sugars at home and on average will get 130's-150's. He's lost 80 pounds in the past but has recently gained some back. His diet consists of limited fast food and some sweets. He cooks at home and will consume some potatoes, pasta, vegetables, lean meats. He's drinking mostly water and some diet soda. He is active at work but does not  exercise daily at this point.  3) Hyperlipidemia: Diagnosed in 2010 and is managed on atorvastatin 40 mg tablets since recent hospitalization. His last physical was in 2014 at [facility].   Review of Systems  Constitutional: Negative for fatigue and unexpected weight change.  HENT: Negative for rhinorrhea.   Respiratory: Negative for cough and shortness of breath.   Cardiovascular: Negative for chest pain.  Gastrointestinal: Negative for diarrhea and constipation.  Genitourinary: Negative for dysuria and frequency.  Musculoskeletal: Negative for myalgias and arthralgias.  Skin: Negative for rash.  Allergic/Immunologic: Negative for environmental allergies.  Neurological: Negative for numbness.  Psychiatric/Behavioral:       Denies concerns for anxiety or depression       Past Medical History  Diagnosis Date  . Diabetes mellitus without complication   . Hypertension   . Aortic insufficiency     a. TTE 11/30/14: EF 25-30%, bicupid valve cannot be excluded, mod-severe aortic regurge, aortic root dimension 52 mm, mild MR, mildly dilated LA/RA, PASP 60; TEE 12/01/14: EF 25-30%, diffuse HK, aortic valve trileaflet, mild to mod Ao regurge, ascending aortic grossly measured at 4.5 cm (not well visualized),   . Nonischemic dilated cardiomyopathy     a. cath 12/02/14: right dom coronary system w/ mild lumenal irregs, o/w no sig CAD, sev diffuse HK EF 25%, no sig MR or AS, med Rx recommeded   . Chronic systolic CHF (congestive heart failure)     a. TTE 11/30/14: EF 25-30%, bicupid valve cannot be excluded, mod-severe aortic regurge, aortic root  dimension 52 mm, mild MR, mildly dilated LA/RA, PASP 60; TEE 12/01/14: EF 25-30%, diffuse HK, aortic valve trileaflet, mild to mod Ao regurge, ascending aortic grossly measured at 4.5 cm (not well visualized),   . Ascending aorta dilatation     a. 52 mm by TTE 11/2014; b. 45 mm by TEE 11/2014 (not well visualized)    History   Social History  . Marital  Status: Married    Spouse Name: N/A  . Number of Children: N/A  . Years of Education: N/A   Occupational History  . Not on file.   Social History Main Topics  . Smoking status: Never Smoker   . Smokeless tobacco: Not on file  . Alcohol Use: No  . Drug Use: Not on file  . Sexual Activity: Not on file   Other Topics Concern  . Not on file   Social History Narrative   Married.   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   No children.   Enjoys playing guitar, traveling, spending time with his wife.    Past Surgical History  Procedure Laterality Date  . Other surgical history      none  . Cardiac catheterization N/A 12/02/2014    Procedure: Left Heart Cath and Coronary Angiography;  Surgeon: Antonieta Ibaimothy J Gollan, MD;  Location: [facility] INVASIVE CV LAB;  Service: Cardiovascular;  Laterality: N/A;    Family History  Problem Relation Age of Onset  . Coronary artery disease Mother   . Hypertension Father   . Diabetes Mellitus II Paternal Uncle     No Known Allergies  No current outpatient prescriptions on file prior to visit.   Current Facility-Administered Medications on File Prior to Visit  Medication Dose Route Frequency Provider Last Rate Last Dose  . acetaminophen (TYLENOL) tablet 650 mg  650 mg Oral Q4H PRN Antonieta Ibaimothy J Gollan, MD        BP 148/82 mmHg  Pulse 76  Temp(Src) 98.6 F (37 C) (Oral)  Ht 6' (1.829 m)  Wt 221 lb 6.4 oz (100.426 kg)  BMI 30.02 kg/m2  SpO2 97%    Objective:   Physical Exam  Constitutional: He is oriented to person, place, and time. He appears well-nourished.  HENT:  Head: Normocephalic.  Cardiovascular: Normal rate and regular rhythm.   Pulmonary/Chest: Effort normal and breath sounds normal.  Musculoskeletal: Normal range of motion.  Neurological: He is alert and oriented to person, place, and time.  Skin: Skin is warm and dry.  Psychiatric: He has a normal mood and affect.          Assessment & Plan:

## 2014-12-16 NOTE — Patient Instructions (Signed)
Increase Lisinopril to 10 mg. You may take 2 of the 5 mg tablets until out of current bottle. I sent the 10 mg tablets to your pharmacy. Refills have been provided for potassium, furosemide, carvedilol, and metformin. I sent the atorvastatin 40 mg tablets for cholesterol so you don't need to half them anymore. Complete lab work prior to leaving today. I will notify you of your results. It was a pleasure to meet you today! Please don't hesitate to call me with any questions. Welcome to [facility]!  Follow up in one month for re-evaluation.

## 2014-12-16 NOTE — Assessment & Plan Note (Addendum)
Last A1C May 23rd. 7.4. Continue Metformin 500 BID. He is working to improve his diet. No neuopathy per patient. Follow up in 1 month. Repeat A1C in 3 months. Continue atrovastatin 40mg 

## 2014-12-18 ENCOUNTER — Encounter: Payer: Self-pay | Admitting: Family

## 2014-12-18 ENCOUNTER — Ambulatory Visit: Payer: Managed Care, Other (non HMO) | Attending: Family | Admitting: Family

## 2014-12-18 VITALS — BP 144/86 | HR 74 | Resp 20 | Ht 72.0 in | Wt 220.0 lb

## 2014-12-18 DIAGNOSIS — I5022 Chronic systolic (congestive) heart failure: Secondary | ICD-10-CM | POA: Insufficient documentation

## 2014-12-18 DIAGNOSIS — I10 Essential (primary) hypertension: Secondary | ICD-10-CM | POA: Diagnosis not present

## 2014-12-18 DIAGNOSIS — E119 Type 2 diabetes mellitus without complications: Secondary | ICD-10-CM | POA: Insufficient documentation

## 2014-12-18 DIAGNOSIS — Z7982 Long term (current) use of aspirin: Secondary | ICD-10-CM | POA: Insufficient documentation

## 2014-12-18 DIAGNOSIS — I5020 Unspecified systolic (congestive) heart failure: Secondary | ICD-10-CM | POA: Insufficient documentation

## 2014-12-18 DIAGNOSIS — Z79899 Other long term (current) drug therapy: Secondary | ICD-10-CM | POA: Diagnosis not present

## 2014-12-18 DIAGNOSIS — I509 Heart failure, unspecified: Secondary | ICD-10-CM | POA: Diagnosis present

## 2014-12-18 NOTE — Patient Instructions (Signed)
Continue weighing daily and call for an overnight weight gain of >2 pounds or a weekly weight gain of >5 pounds.   Continue reading food labels and monitoring sodium intake.

## 2014-12-18 NOTE — Progress Notes (Signed)
Subjective:    Patient ID: Dennis Curry, male    DOB: 10/10/1965, 49 y.o.   MRN: [id number]  Congestive Heart Failure Presents for initial visit. The disease course has been improving. The condition has lasted for 1 month. Pertinent negatives include no abdominal pain, chest pain, chest pressure, edema, fatigue, orthopnea, palpitations or shortness of breath. The symptoms have been resolved. Past treatments include ACE inhibitors, beta blockers and salt and fluid restriction. The treatment provided significant relief. Compliance with prior treatments has been good. His past medical history is significant for DM and HTN. There is no history of CVA.   No Known Allergies  Prior to Admission medications   Medication Sig Start Date End Date Taking? Authorizing Provider  aspirin 81 MG tablet Take 81 mg by mouth daily.   Yes Historical Provider, MD  atorvastatin (LIPITOR) 40 MG tablet Take 1 tablet (40 mg total) by mouth at bedtime. 12/16/14  Yes [name], NP  carvedilol (COREG) 3.125 MG tablet Take 1 tablet (3.125 mg total) by mouth 2 (two) times daily with a meal. 12/16/14  Yes [name], NP  furosemide (LASIX) 40 MG tablet Take 1 tablet (40 mg total) by mouth 2 (two) times daily. 12/16/14  Yes [name], NP  lisinopril (PRINIVIL,ZESTRIL) 10 MG tablet Take 1 tablet (10 mg total) by mouth daily. 12/16/14  Yes [name], NP  metFORMIN (GLUCOPHAGE) 500 MG tablet Take 1 tablet (500 mg total) by mouth 2 (two) times daily. 12/16/14  Yes [name], NP  potassium chloride (K-DUR) 10 MEQ tablet Take 2 tablets (20 mEq total) by mouth daily. 12/16/14  Yes [name], NP     Review of Systems  Constitutional: Negative for appetite change and fatigue.  HENT: Negative for congestion and postnasal drip.   Eyes: Negative.   Respiratory: Negative for cough, chest tightness and shortness of breath.   Cardiovascular: Negative for chest pain, palpitations and leg swelling.   Gastrointestinal: Negative for abdominal pain and abdominal distention.  Endocrine: Negative.   Genitourinary: Negative.   Musculoskeletal: Negative.   Skin: Negative.   Allergic/Immunologic: Negative.   Neurological: Negative for dizziness and weakness.  Hematological: Negative for adenopathy. Does not bruise/bleed easily.  Psychiatric/Behavioral: Negative for sleep disturbance (sleeps on 2 pillows). The patient is not nervous/anxious.        Objective:   Physical Exam  Constitutional: He is oriented to person, place, and time. He appears well-developed and well-nourished.  HENT:  Head: Normocephalic and atraumatic.  Eyes: Conjunctivae are normal. Pupils are equal, round, and reactive to light.  Neck: Normal range of motion. Neck supple.  Cardiovascular: Normal rate and regular rhythm.   No murmur heard. Pulmonary/Chest: Effort normal. He has no wheezes. He has no rales.  Abdominal: Soft. He exhibits no distension.  Musculoskeletal: He exhibits no edema or tenderness.  Neurological: He is alert and oriented to person, place, and time.  Skin: Skin is warm and dry.  Psychiatric: He has a normal mood and affect. His behavior is normal. Thought content normal.  Nursing note and vitals reviewed.  BP 144/86 mmHg  Pulse 74  Resp 20  Ht 6' (1.829 m)  Wt 220 lb (99.791 kg)  BMI 29.83 kg/m2  SpO2 98%     Assessment & Plan:  1: Chronic heart failure with reduced ejection fraction- Patient presents without symptoms at this time. He says that he's quite active and can pretty much do whatever he needs to.  He is already weighing himself daily and he was reminded to call for an overnight weight gain of >2 pounds or a weekly weight gain of >5 pounds. He is not adding any salt to his food and tries to closely follow a low sodium diet. Written information was given to him regarding a 2000mg  sodium diet. Discussed titrating up his carvedilol to a target dose of 12.5mg  twice daily and changing  his lisinopril to entresto. Lisinopril has recently been increased so will leave medications the same for now.  2: HTN- Blood pressure only mildly elevated. Again, lisinopril has recently been increased by his PCP.  3: Diabetes- He says that his most recent fasting glucose was 120. Currently taking metformin twice daily.   Return in 1 month or sooner for any questions/problems before the next office visit.

## 2014-12-24 ENCOUNTER — Encounter: Payer: Self-pay | Admitting: Cardiovascular Disease

## 2014-12-24 ENCOUNTER — Ambulatory Visit (INDEPENDENT_AMBULATORY_CARE_PROVIDER_SITE_OTHER): Payer: Managed Care, Other (non HMO) | Admitting: Cardiovascular Disease

## 2014-12-24 VITALS — BP 140/80 | HR 76 | Ht 72.0 in | Wt 218.5 lb

## 2014-12-24 DIAGNOSIS — I10 Essential (primary) hypertension: Secondary | ICD-10-CM | POA: Diagnosis not present

## 2014-12-24 DIAGNOSIS — I359 Nonrheumatic aortic valve disorder, unspecified: Secondary | ICD-10-CM

## 2014-12-24 DIAGNOSIS — I7121 Aneurysm of the ascending aorta, without rupture: Secondary | ICD-10-CM

## 2014-12-24 DIAGNOSIS — E1159 Type 2 diabetes mellitus with other circulatory complications: Secondary | ICD-10-CM

## 2014-12-24 DIAGNOSIS — I5022 Chronic systolic (congestive) heart failure: Secondary | ICD-10-CM | POA: Diagnosis not present

## 2014-12-24 DIAGNOSIS — I5021 Acute systolic (congestive) heart failure: Secondary | ICD-10-CM

## 2014-12-24 DIAGNOSIS — I712 Thoracic aortic aneurysm, without rupture: Secondary | ICD-10-CM | POA: Diagnosis not present

## 2014-12-24 MED ORDER — SPIRONOLACTONE 25 MG PO TABS: 25.0000 mg | ORAL_TABLET | Freq: Every day | ORAL | Status: DC

## 2014-12-24 MED ORDER — LISINOPRIL 20 MG PO TABS: 20.0000 mg | ORAL_TABLET | Freq: Every day | ORAL | Status: DC

## 2014-12-24 MED ORDER — CARVEDILOL 6.25 MG PO TABS: 6.2500 mg | ORAL_TABLET | Freq: Two times a day (BID) | ORAL | Status: DC

## 2014-12-24 NOTE — Patient Instructions (Addendum)
You are doing well.  Please start spironolactone 25 mg daily (diuetic that holds on to potassium) (today) Increase the coreg up to 6.25 mg twice a day (next week, monday) Increase the lisinopril up to 20 mg daily (next week, wednesday)  Please call us if you have new issues that need to be addressed before your next appt.  Your physician wants you to follow-up in: 3 months.  You will receive a reminder letter in the mail two months in advance. If you don't receive a letter, please call our office to schedule the follow-up appointment.  Echo prior to follow up visit

## 2014-12-24 NOTE — Assessment & Plan Note (Signed)
Previously with significant aortic valve insufficiency. Will monitor with periodic echocardiogram

## 2014-12-24 NOTE — Assessment & Plan Note (Signed)
We have encouraged continued exercise, careful diet management in an effort to lose weight. 

## 2014-12-24 NOTE — Progress Notes (Signed)
Patient ID: Dennis Curry, male    DOB: [birth year], 49 y.o.   MRN: [id number]  HPI Comments: 49 year old gentleman who in early 2016 was doing well, developed progressive chest congestion initially felt to be allergies, treated with several rounds of anabiotic's, presenting to the hospital with worsening shortness of breath found to have acute systolic CHF, dilated ascending aorta, cardiac catheterization showing no significant CAD, aorta 4.5 cm on TEE, treated for his systolic CHF with discharge here today.  In follow-up, he reports he is doing well. Seen recently by the CHF clinic. Lisinopril recently increased from 5 mg up to 10 mg. Blood pressure continues to run high at times, systolic pressure 140, 150. Otherwise he feels back to his baseline, denies any leg edema.  History the better, watching his fluid intake, watching his weight. Follow-up with primary care early July, heart failure clinic late July  EKG on today's visit shows normal sinus rhythm with rate 76 bpm, nonspecific ST abnormality anterolateral leads, left axis deviation/left anterior fascicular block  Lab review shows low potassium 3.3 despite taking potassium 10 meq was twice a day   No Known Allergies  Current Outpatient Prescriptions on File Prior to Visit  Medication Sig Dispense Refill  . aspirin 81 MG tablet Take 81 mg by mouth daily.    Marland Kitchen atorvastatin (LIPITOR) 40 MG tablet Take 1 tablet (40 mg total) by mouth at bedtime. 30 tablet 5  . furosemide (LASIX) 40 MG tablet Take 1 tablet (40 mg total) by mouth 2 (two) times daily. 60 tablet 0  . metFORMIN (GLUCOPHAGE) 500 MG tablet Take 1 tablet (500 mg total) by mouth 2 (two) times daily. 60 tablet 5  . potassium chloride (K-DUR) 10 MEQ tablet Take 2 tablets (20 mEq total) by mouth daily. 30 tablet 0   Current Facility-Administered Medications on File Prior to Visit  Medication Dose Route Frequency Provider Last Rate Last Dose  . acetaminophen (TYLENOL) tablet 650  mg  650 mg Oral Q4H PRN Antonieta Iba, MD        Past Medical History  Diagnosis Date  . Diabetes mellitus without complication   . Hypertension   . Aortic insufficiency     a. TTE 11/30/14: EF 25-30%, bicupid valve cannot be excluded, mod-severe aortic regurge, aortic root dimension 52 mm, mild MR, mildly dilated LA/RA, PASP 60; TEE 12/01/14: EF 25-30%, diffuse HK, aortic valve trileaflet, mild to mod Ao regurge, ascending aortic grossly measured at 4.5 cm (not well visualized),   . Nonischemic dilated cardiomyopathy     a. cath 12/02/14: right dom coronary system w/ mild lumenal irregs, o/w no sig CAD, sev diffuse HK EF 25%, no sig MR or AS, med Rx recommeded   . Chronic systolic CHF (congestive heart failure)     a. TTE 11/30/14: EF 25-30%, bicupid valve cannot be excluded, mod-severe aortic regurge, aortic root dimension 52 mm, mild MR, mildly dilated LA/RA, PASP 60; TEE 12/01/14: EF 25-30%, diffuse HK, aortic valve trileaflet, mild to mod Ao regurge, ascending aortic grossly measured at 4.5 cm (not well visualized),   . Ascending aorta dilatation     a. 52 mm by TTE 11/2014; b. 45 mm by TEE 11/2014 (not well visualized)    Past Surgical History  Procedure Laterality Date  . Other surgical history      none  . Cardiac catheterization N/A 12/02/2014    Procedure: Left Heart Cath and Coronary Angiography;  Surgeon: Antonieta Iba, MD;  Location: [facility] INVASIVE  CV LAB;  Service: Cardiovascular;  Laterality: N/A;  . Coronary angioplasty      Social History  reports that he has never smoked. He does not have any smokeless tobacco history on file. He reports that he does not drink alcohol or use illicit drugs.  Family History family history includes Coronary artery disease in his mother; Diabetes Mellitus II in his paternal uncle; Hypertension in his father.   Review of Systems  Constitutional: Negative.   Respiratory: Negative.   Cardiovascular: Negative.   Gastrointestinal: Negative.    Musculoskeletal: Negative.   Neurological: Negative.   Hematological: Negative.   Psychiatric/Behavioral: Negative.   All other systems reviewed and are negative.   BP 140/80 mmHg  Pulse 76  Ht 6' (1.829 m)  Wt 218 lb 8 oz (99.111 kg)  BMI 29.63 kg/m2   Physical Exam  Constitutional: He is oriented to person, place, and time. He appears well-developed and well-nourished.  HENT:  Head: Normocephalic.  Nose: Nose normal.  Mouth/Throat: Oropharynx is clear and moist.  Eyes: Conjunctivae are normal. Pupils are equal, round, and reactive to light.  Neck: Normal range of motion. Neck supple. No JVD present.  Cardiovascular: Normal rate, regular rhythm, normal heart sounds and intact distal pulses.  Exam reveals no gallop and no friction rub.   No murmur heard. Pulmonary/Chest: Effort normal and breath sounds normal. No respiratory distress. He has no wheezes. He has no rales. He exhibits no tenderness.  Abdominal: Soft. Bowel sounds are normal. He exhibits no distension. There is no tenderness.  Musculoskeletal: Normal range of motion. He exhibits no edema or tenderness.  Lymphadenopathy:    He has no cervical adenopathy.  Neurological: He is alert and oriented to person, place, and time. Coordination normal.  Skin: Skin is warm and dry. No rash noted. No erythema.  Psychiatric: He has a normal mood and affect. His behavior is normal. Judgment and thought content normal.

## 2014-12-24 NOTE — Assessment & Plan Note (Addendum)
Etiology of his depressed ejection fraction unclear, idiopathic, perhaps hypertensive cardiomyopathy Blood pressure continues to run high. We will increase lisinopril up to 20 mg daily. For low potassium, we will add spironolactone 25 mg daily Increase carvedilol up to 6.25 mg daily Repeat echocardiogram in 2-3 months time for new baseline EF

## 2014-12-24 NOTE — Assessment & Plan Note (Signed)
Medication changes as above. He will continue to monitor blood pressure with goal systolic pressure 120 systolic or less ideally

## 2014-12-24 NOTE — Assessment & Plan Note (Signed)
Moderately dilated aneurysm of the aorta, 4.5 cm on TEE. We'll recommend echo once per year for monitoring

## 2014-12-29 ENCOUNTER — Telehealth: Payer: Self-pay

## 2014-12-29 ENCOUNTER — Telehealth: Payer: Self-pay | Admitting: *Deleted

## 2014-12-29 ENCOUNTER — Other Ambulatory Visit: Payer: Self-pay | Admitting: Primary Care

## 2014-12-29 NOTE — Telephone Encounter (Signed)
Dennis Curry said pt got v/m but could not understand message. Dennis Kloos notified as instructed from v/m and will contact cardiologist; if needed she will cb to [facility].

## 2014-12-29 NOTE — Telephone Encounter (Signed)
Spoke w/ pt's wife.  She reports that pt has developed hoarseness and cracking in his voice "like he's going through puberty again". Advised pt that cough is a potential side effect of his lisinopril, but he denies cough, sore throat or sinus drainage.  She states that hoarseness started after med change at [facility] 12/24/14 and that she believes it is r/t one or all of his meds. She would like to make Dr. Mariah Milling aware and get his thoughts.  Please advise.  Thank you.

## 2014-12-29 NOTE — Telephone Encounter (Signed)
Received electrcically refill request for potassium chloride (K-DUR) 10 MEQ tablet. Last prescribed on  12/16/2014.  Dispense: 30 tablet  Refills: 0    Next apt on 01/18/15.

## 2014-12-29 NOTE — Telephone Encounter (Signed)
Pt wife calling stating that since we up'ed the does on medication  Pt has been sounding a lot more horse, cracking in his voice. Not sure if this is normal or if this indicates if something is wrong.  Please advise.

## 2014-12-29 NOTE — Telephone Encounter (Signed)
Attempted to return call. Left voicemail suggesting he call his cardiologist with his symptoms, but I am also happy to addresses any concerns.

## 2014-12-29 NOTE — Telephone Encounter (Signed)
Mrs Maresh left v/m; pt was seen 12/16/2014 and has seen cardiologist since saw Mayra Reel NP; cardiologist added spironolactone and increased dosage of lisinopril and coreg. Pt wonders if having side effect; pt having quivering of voice and hoarseness. Mrs Matuszak request cb.

## 2014-12-30 ENCOUNTER — Encounter: Payer: Self-pay | Admitting: Cardiovascular Disease

## 2014-12-30 ENCOUNTER — Other Ambulatory Visit: Payer: Self-pay | Admitting: Primary Care

## 2014-12-30 DIAGNOSIS — E876 Hypokalemia: Secondary | ICD-10-CM

## 2014-12-30 DIAGNOSIS — I5021 Acute systolic (congestive) heart failure: Secondary | ICD-10-CM

## 2014-12-30 NOTE — Telephone Encounter (Signed)
If symptoms have persisted Would hold lisinopril, changed to losartan 100 mg daily If no improvement of symptoms, would call our office

## 2014-12-30 NOTE — Telephone Encounter (Signed)
Sent MyChart message to pt.

## 2014-12-31 MED ORDER — FUROSEMIDE 40 MG PO TABS: 40.0000 mg | ORAL_TABLET | Freq: Two times a day (BID) | ORAL | Status: DC

## 2014-12-31 NOTE — Telephone Encounter (Signed)
Refills request from patient on MyChart.

## 2014-12-31 NOTE — Telephone Encounter (Signed)
Refill request from patient on MyChart

## 2015-01-01 ENCOUNTER — Other Ambulatory Visit: Payer: Self-pay

## 2015-01-01 MED ORDER — LOSARTAN POTASSIUM 100 MG PO TABS: 100.0000 mg | ORAL_TABLET | Freq: Every day | ORAL | Status: DC

## 2015-01-18 ENCOUNTER — Ambulatory Visit (INDEPENDENT_AMBULATORY_CARE_PROVIDER_SITE_OTHER): Payer: Managed Care, Other (non HMO) | Admitting: Primary Care

## 2015-01-18 ENCOUNTER — Encounter: Payer: Self-pay | Admitting: Primary Care

## 2015-01-18 VITALS — BP 144/78 | HR 71 | Temp 98.2°F | Ht 72.0 in | Wt 220.0 lb

## 2015-01-18 DIAGNOSIS — I5021 Acute systolic (congestive) heart failure: Secondary | ICD-10-CM | POA: Diagnosis not present

## 2015-01-18 DIAGNOSIS — I5022 Chronic systolic (congestive) heart failure: Secondary | ICD-10-CM | POA: Diagnosis not present

## 2015-01-18 DIAGNOSIS — E876 Hypokalemia: Secondary | ICD-10-CM

## 2015-01-18 DIAGNOSIS — I10 Essential (primary) hypertension: Secondary | ICD-10-CM | POA: Diagnosis not present

## 2015-01-18 LAB — BASIC METABOLIC PANEL
BUN: 16 mg/dL (ref 6–23)
CO2: 31 meq/L (ref 19–32)
Calcium: 9.4 mg/dL (ref 8.4–10.5)
Chloride: 100 mEq/L (ref 96–112)
Creatinine, Ser: 1.05 mg/dL (ref 0.40–1.50)
GFR: 79.69 mL/min (ref >60.00)
GLUCOSE: 176 mg/dL — AB (ref 70–99)
Potassium: 3.6 mEq/L (ref 3.5–5.1)
Sodium: 139 mEq/L (ref 135–145)

## 2015-01-18 MED ORDER — SPIRONOLACTONE 25 MG PO TABS: 25.0000 mg | ORAL_TABLET | Freq: Two times a day (BID) | ORAL | Status: DC

## 2015-01-18 NOTE — Assessment & Plan Note (Signed)
Improved. BMP today revealed potassium of 3.6. Managed on losartan 100 mg and spirinolactone 25 mg daily. BP is still not at goal, given history will increase spironolactone to BID dosing with follow up in BP and BMP in 2-3 weeks.

## 2015-01-18 NOTE — Assessment & Plan Note (Signed)
Follow up with CHF clinic next week.

## 2015-01-18 NOTE — Patient Instructions (Addendum)
Continue your efforts of reducing fast food and intake of salt levels. Continue walking daily.  Complete lab work prior to leaving today. I will notify you of your results.  Continue checking your BP three times weekly. If your BP remains over 140 on the top, please call me.  Follow up in 6 weeks (after or on August 23rd) for re-evaluation of diabetes and hypertension.  It was nice seeing you!

## 2015-01-18 NOTE — Progress Notes (Signed)
Pre visit review using our clinic review tool, if applicable. No additional management support is needed unless otherwise documented below in the visit note. 

## 2015-01-18 NOTE — Assessment & Plan Note (Signed)
Stable. Has follow up appointment with CHF clinic later in July. Denies SOB, chest pain, extremity swelling.

## 2015-01-18 NOTE — Assessment & Plan Note (Addendum)
Slightly above goal today.  He's checking at home and will get mainly 140's/70's which is slightly too high. He is working to improve his diet and exercise.  Will increase spironolactone to BID dosing and closely monitor potassium levels in 2-3 weeks.

## 2015-01-18 NOTE — Progress Notes (Signed)
Subjective:    Patient ID: Dennis Curry, male    DOB: 1966-04-27, 49 y.o.   MRN: [id number]  HPI  Mr. Dennis Curry is a 49 year old male who presents today for follow up. He was evaluated as a new patient one month ago and has been following with cardiology for systolic CHF and dilated ascending aorta. He is following with the heart failure clinic later this month for re-evaluation.  His blood pressure was treated last visit with an increase of lisinopril from 10 mg to 20 mg. He then developed hoarsness to his throat which was suspected to be a s/e of increased dose. He is no longer taking his lisinopril 10. He is now on Losartan 100 mg and carvedilol 6.25 mg BID. He's been checking his blood pressure at home and has been getting 140's/70's/80's. Denies chest pain, headaches. He's been working on increasing his exercise gradually. He's also improving his diet by increasing vegetables and fruits and limiting fast food. He's recently been on vacation and has not been mindful of his diet and exercise regimen.  His potassium has been historically low since hospitalization and was initiated on a supplement upon his new patient visit. He was then placed on Spironolactone by cardiology in mid June and I advised him to d/c his potassium supplement. He has not been taking his potassium since. Overall he's feeling well.  Review of Systems  Respiratory: Negative for cough and shortness of breath.   Cardiovascular: Negative for chest pain.  Musculoskeletal: Negative for myalgias.  Neurological: Negative for dizziness and headaches.       Past Medical History  Diagnosis Date  . Diabetes mellitus without complication   . Hypertension   . Aortic insufficiency     a. TTE 11/30/14: EF 25-30%, bicupid valve cannot be excluded, mod-severe aortic regurge, aortic root dimension 52 mm, mild MR, mildly dilated LA/RA, PASP 60; TEE 12/01/14: EF 25-30%, diffuse HK, aortic valve trileaflet, mild to mod Ao regurge,  ascending aortic grossly measured at 4.5 cm (not well visualized),   . Nonischemic dilated cardiomyopathy     a. cath 12/02/14: right dom coronary system w/ mild lumenal irregs, o/w no sig CAD, sev diffuse HK EF 25%, no sig MR or AS, med Rx recommeded   . Chronic systolic CHF (congestive heart failure)     a. TTE 11/30/14: EF 25-30%, bicupid valve cannot be excluded, mod-severe aortic regurge, aortic root dimension 52 mm, mild MR, mildly dilated LA/RA, PASP 60; TEE 12/01/14: EF 25-30%, diffuse HK, aortic valve trileaflet, mild to mod Ao regurge, ascending aortic grossly measured at 4.5 cm (not well visualized),   . Ascending aorta dilatation     a. 52 mm by TTE 11/2014; b. 45 mm by TEE 11/2014 (not well visualized)    History   Social History  . Marital Status: Married    Spouse Name: N/A  . Number of Children: N/A  . Years of Education: N/A   Occupational History  . Not on file.   Social History Main Topics  . Smoking status: Never Smoker   . Smokeless tobacco: Not on file  . Alcohol Use: No  . Drug Use: No  . Sexual Activity: Not on file   Other Topics Concern  . Not on file   Social History Narrative   Married.   Works as a Engineer, structural at [employer].   No children.   Enjoys playing guitar, traveling, spending time with his wife.    Past Surgical History  Procedure Laterality Date  . Other surgical history      none  . Cardiac catheterization N/A 12/02/2014    Procedure: Left Heart Cath and Coronary Angiography;  Surgeon: Antonieta Ibaimothy J Gollan, MD;  Location: [facility] INVASIVE CV LAB;  Service: Cardiovascular;  Laterality: N/A;  . Coronary angioplasty      Family History  Problem Relation Age of Onset  . Coronary artery disease Mother   . Hypertension Father   . Diabetes Mellitus II Paternal Uncle     No Known Allergies  Current Outpatient Prescriptions on File Prior to Visit  Medication Sig Dispense Refill  . aspirin 81 MG tablet Take 81 mg by mouth daily.    Marland Kitchen.  atorvastatin (LIPITOR) 40 MG tablet Take 1 tablet (40 mg total) by mouth at bedtime. 30 tablet 5  . carvedilol (COREG) 6.25 MG tablet Take 1 tablet (6.25 mg total) by mouth 2 (two) times daily with a meal. 60 tablet 11  . furosemide (LASIX) 40 MG tablet Take 1 tablet (40 mg total) by mouth 2 (two) times daily. 60 tablet 0  . losartan (COZAAR) 100 MG tablet Take 1 tablet (100 mg total) by mouth daily. 90 tablet 3  . metFORMIN (GLUCOPHAGE) 500 MG tablet Take 1 tablet (500 mg total) by mouth 2 (two) times daily. 60 tablet 5  . spironolactone (ALDACTONE) 25 MG tablet Take 1 tablet (25 mg total) by mouth daily. 30 tablet 6  . potassium chloride (K-DUR) 10 MEQ tablet Take 2 tablets (20 mEq total) by mouth daily. (Patient not taking: Reported on 01/18/2015) 30 tablet 0   Current Facility-Administered Medications on File Prior to Visit  Medication Dose Route Frequency Provider Last Rate Last Dose  . acetaminophen (TYLENOL) tablet 650 mg  650 mg Oral Q4H PRN Antonieta Ibaimothy J Gollan, MD        BP 144/78 mmHg  Pulse 71  Temp(Src) 98.2 F (36.8 C) (Oral)  Ht 6' (1.829 m)  Wt 220 lb (99.791 kg)  BMI 29.83 kg/m2  SpO2 98%    Objective:   Physical Exam  Constitutional: He appears well-nourished.  Cardiovascular: Normal rate and regular rhythm.   Pulmonary/Chest: Effort normal and breath sounds normal.  Skin: Skin is warm and dry.          Assessment & Plan:

## 2015-01-25 ENCOUNTER — Ambulatory Visit: Payer: Managed Care, Other (non HMO) | Attending: Family | Admitting: Family

## 2015-01-25 ENCOUNTER — Encounter: Payer: Self-pay | Admitting: Family

## 2015-01-25 ENCOUNTER — Other Ambulatory Visit: Payer: Self-pay | Admitting: Primary Care

## 2015-01-25 VITALS — BP 149/77 | HR 77 | Resp 20 | Ht 72.0 in | Wt 219.0 lb

## 2015-01-25 DIAGNOSIS — I10 Essential (primary) hypertension: Secondary | ICD-10-CM | POA: Diagnosis not present

## 2015-01-25 DIAGNOSIS — I420 Dilated cardiomyopathy: Secondary | ICD-10-CM | POA: Diagnosis not present

## 2015-01-25 DIAGNOSIS — I5022 Chronic systolic (congestive) heart failure: Secondary | ICD-10-CM | POA: Diagnosis present

## 2015-01-25 DIAGNOSIS — Z79899 Other long term (current) drug therapy: Secondary | ICD-10-CM | POA: Diagnosis not present

## 2015-01-25 DIAGNOSIS — E1159 Type 2 diabetes mellitus with other circulatory complications: Secondary | ICD-10-CM

## 2015-01-25 DIAGNOSIS — E876 Hypokalemia: Secondary | ICD-10-CM | POA: Insufficient documentation

## 2015-01-25 DIAGNOSIS — Z7982 Long term (current) use of aspirin: Secondary | ICD-10-CM | POA: Insufficient documentation

## 2015-01-25 DIAGNOSIS — E119 Type 2 diabetes mellitus without complications: Secondary | ICD-10-CM | POA: Insufficient documentation

## 2015-01-25 DIAGNOSIS — I351 Nonrheumatic aortic (valve) insufficiency: Secondary | ICD-10-CM | POA: Insufficient documentation

## 2015-01-25 NOTE — Patient Instructions (Signed)
Continue weighing daily and call for an overnight weight gain of >2 pounds or a weekly weight gain of >5 pounds.   Continue to follow a low sodium diet.

## 2015-01-25 NOTE — Progress Notes (Signed)
Subjective:    Patient ID: Dennis Curry, male    DOB: 1966/03/26, 49 y.o.   MRN: [id number]  Congestive Heart Failure Presents for follow-up visit. The disease course has been improving. Pertinent negatives include no abdominal pain, chest pain, chest pressure, edema, fatigue, orthopnea, palpitations or shortness of breath. The symptoms have been stable. Past treatments include ACE inhibitors, aldosterone receptor blockers, angiotensin receptor blockers, beta blockers and salt and fluid restriction. The treatment provided significant relief. Compliance with prior treatments has been good. His past medical history is significant for DM and HTN. There is no history of CVA.    Past Medical History  Diagnosis Date  . Diabetes mellitus without complication   . Hypertension   . Aortic insufficiency     a. TTE 11/30/14: EF 25-30%, bicupid valve cannot be excluded, mod-severe aortic regurge, aortic root dimension 52 mm, mild MR, mildly dilated LA/RA, PASP 60; TEE 12/01/14: EF 25-30%, diffuse HK, aortic valve trileaflet, mild to mod Ao regurge, ascending aortic grossly measured at 4.5 cm (not well visualized),   . Nonischemic dilated cardiomyopathy     a. cath 12/02/14: right dom coronary system w/ mild lumenal irregs, o/w no sig CAD, sev diffuse HK EF 25%, no sig MR or AS, med Rx recommeded   . Chronic systolic CHF (congestive heart failure)     a. TTE 11/30/14: EF 25-30%, bicupid valve cannot be excluded, mod-severe aortic regurge, aortic root dimension 52 mm, mild MR, mildly dilated LA/RA, PASP 60; TEE 12/01/14: EF 25-30%, diffuse HK, aortic valve trileaflet, mild to mod Ao regurge, ascending aortic grossly measured at 4.5 cm (not well visualized),   . Ascending aorta dilatation     a. 52 mm by TTE 11/2014; b. 45 mm by TEE 11/2014 (not well visualized)    Past Surgical History  Procedure Laterality Date  . Other surgical history      none  . Cardiac catheterization N/A 12/02/2014    Procedure: Left  Heart Cath and Coronary Angiography;  Surgeon: Antonieta Iba, MD;  Location: [facility] INVASIVE CV LAB;  Service: Cardiovascular;  Laterality: N/A;  . Coronary angioplasty      History  Substance Use Topics  . Smoking status: Never Smoker   . Smokeless tobacco: Not on file  . Alcohol Use: No    No Known Allergies   Prior to Admission medications   Medication Sig Start Date End Date Taking? Authorizing Provider  aspirin 81 MG tablet Take 81 mg by mouth daily.    Historical Provider, MD  atorvastatin (LIPITOR) 40 MG tablet Take 1 tablet (40 mg total) by mouth at bedtime. 12/16/14   [name], NP  carvedilol (COREG) 6.25 MG tablet Take 1 tablet (6.25 mg total) by mouth 2 (two) times daily with a meal. 12/24/14   Antonieta Iba, MD  furosemide (LASIX) 40 MG tablet Take 1 tablet (40 mg total) by mouth 2 (two) times daily. 12/31/14   [name], NP  losartan (COZAAR) 100 MG tablet Take 1 tablet (100 mg total) by mouth daily. 01/01/15   Antonieta Iba, MD  metFORMIN (GLUCOPHAGE) 500 MG tablet Take 1 tablet (500 mg total) by mouth 2 (two) times daily. 12/16/14   [name], NP  spironolactone (ALDACTONE) 25 MG tablet Take 1 tablet (25 mg total) by mouth 2 (two) times daily. 01/18/15   [name], NP    Review of Systems  Constitutional: Negative for appetite change and fatigue.  HENT: Negative  for congestion, postnasal drip and sore throat.   Eyes: Negative.   Respiratory: Negative for cough, chest tightness and shortness of breath.   Cardiovascular: Negative for chest pain, palpitations and leg swelling.  Gastrointestinal: Negative for abdominal pain and abdominal distention.  Endocrine: Negative.   Genitourinary: Negative.   Musculoskeletal: Negative.   Allergic/Immunologic: Negative.   Neurological: Negative for dizziness and headaches.  Hematological: Negative for adenopathy. Does not bruise/bleed easily.  Psychiatric/Behavioral: Negative for sleep disturbance  (sleeping on 2 pillows) and dysphoric mood.       Objective:   Physical Exam  Constitutional: He is oriented to person, place, and time. He appears well-developed and well-nourished.  HENT:  Head: Normocephalic and atraumatic.  Eyes: Conjunctivae are normal. Pupils are equal, round, and reactive to light.  Neck: Normal range of motion. Neck supple.  Cardiovascular: Normal rate and regular rhythm.   Pulmonary/Chest: Effort normal. He has no wheezes. He has no rales.  Abdominal: Soft. He exhibits no distension. There is no tenderness.  Musculoskeletal: He exhibits no edema or tenderness.  Neurological: He is alert and oriented to person, place, and time.  Skin: Skin is warm and dry.  Psychiatric: He has a normal mood and affect. His behavior is normal. Thought content normal.  Nursing note and vitals reviewed.  BP 149/77 mmHg  Pulse 77  Resp 20  Ht 6' (1.829 m)  Wt 219 lb (99.338 kg)  BMI 29.70 kg/m2  SpO2 99%        Assessment & Plan:  1: Chronic heart failure with reduced ejection fraction- Patient presents currently symptom free at this time. [place] and [place]+ miles almost daily and says that he did fine with that. No difficulty sleeping. Continues to weigh himself and reports a stable weight. Reminded to call for an overnight weight gain of >2 pounds or a weekly weight gain of >5 pounds. He is not adding salt to his food and is reading food labels so that he can continue to follow a 2000mg  sodium diet. Has recently had multiple medication changes so will not further adjust medications at this time. Again, discussed possibly increasing his carvedilol to 12.5mg  twice daily and switching from losartan to entresto but will allow his body time to adjust to recent changes. 2: Diabetes- Patient says that his most recent glucose level was 120. Continues to take metformin and monitor his diet. 3: HTN- Blood pressure mildly elevated here but he says that at  home, the top number is running in the 130's.  4: Hypokalemia- Patient has a history of his and most recent result shows a low potassium. Spironolactone has been increased to 25mg  twice daily and he says that he's due to have a repeat blood test in a few weeks.  Return in 3 months or sooner for any questions/problems before then.

## 2015-01-25 NOTE — Telephone Encounter (Signed)
Refill request.  furosemide  40 MG tablet -- last prescribed on 12/31/14.  Next apt on 02/08/15

## 2015-01-27 ENCOUNTER — Other Ambulatory Visit: Payer: Self-pay | Admitting: Primary Care

## 2015-01-28 NOTE — Telephone Encounter (Signed)
Refill request.  Was first prescribed when he was in ED on 11/30/14. Last prescribed on 12/31/14. Furosemide 40 MG tablet Dispense: 60 tablet Refill:  0

## 2015-02-08 ENCOUNTER — Encounter: Payer: Self-pay | Admitting: Primary Care

## 2015-02-08 ENCOUNTER — Ambulatory Visit (INDEPENDENT_AMBULATORY_CARE_PROVIDER_SITE_OTHER): Payer: Managed Care, Other (non HMO) | Admitting: Primary Care

## 2015-02-08 VITALS — BP 138/72 | HR 71 | Temp 98.8°F | Ht 72.0 in | Wt 221.4 lb

## 2015-02-08 DIAGNOSIS — E876 Hypokalemia: Secondary | ICD-10-CM

## 2015-02-08 DIAGNOSIS — I5022 Chronic systolic (congestive) heart failure: Secondary | ICD-10-CM

## 2015-02-08 DIAGNOSIS — I10 Essential (primary) hypertension: Secondary | ICD-10-CM

## 2015-02-08 DIAGNOSIS — E1159 Type 2 diabetes mellitus with other circulatory complications: Secondary | ICD-10-CM | POA: Diagnosis not present

## 2015-02-08 LAB — BASIC METABOLIC PANEL
BUN: 20 mg/dL (ref 6–23)
CALCIUM: 9.6 mg/dL (ref 8.4–10.5)
CO2: 27 meq/L (ref 19–32)
CREATININE: 1.17 mg/dL (ref 0.40–1.50)
Chloride: 100 mEq/L (ref 96–112)
GFR: 70.32 mL/min (ref >60.00)
GLUCOSE: 197 mg/dL — AB (ref 70–99)
POTASSIUM: 3.9 meq/L (ref 3.5–5.1)
SODIUM: 138 meq/L (ref 135–145)

## 2015-02-08 NOTE — Assessment & Plan Note (Signed)
Due for repeat A1C in late August.

## 2015-02-08 NOTE — Patient Instructions (Addendum)
Continue to check your blood pressure once daily.  Complete lab work prior to leaving today. I will notify you of your results.  Call me if you get a blood pressure lower than 100/60.  Follow up on August 25th as scheduled for follow up of diabetes. It was very nice to see you!

## 2015-02-08 NOTE — Assessment & Plan Note (Addendum)
Stable today. Home readings are 120's-130's/70's. One episode of dizziness with postural change. Continue current regimen, check potassium today due to recent increase in spironolactone dose. He is to call me if he has readings below 100/60.  Follow up in late August for repeat A1C and BP check.

## 2015-02-08 NOTE — Progress Notes (Deleted)
left bundle branch block   

## 2015-02-08 NOTE — Progress Notes (Signed)
Subjective:    Patient ID: Dennis Curry, male    DOB: [birth year], 49 y.o.   MRN: [id number]  HPI  Dennis Curry is a 49 year old male who presents today for follow up of hypertension. Last visit his blood pressure was above goal and home readings were in the 140's/70's. He is managed on Losartan 100 mg, Spironolactone 25 mg and Carvedilol 6.25 mg. Last visit we increased his spirolactone to 25 mg BID.   Since last visit he's been feeling well, he's had a few episodes of dizziness upon position change. Denies chest pain, headaches, blurred vision. He's checking his blood pressure once to twice daily. He's been getting 120's-130's/70's.   He had follow up with CHF clinic several weeks ago, and has a repeat cardiac echocardiogram on August 19th and follow up with cardiology in September. He will follow up with CHF clinic in October.  BP Readings from Last 3 Encounters:  02/08/15 138/72  01/25/15 149/77  01/18/15 144/78     Review of Systems  Respiratory: Negative for shortness of breath.   Cardiovascular: Negative for chest pain and leg swelling.  Neurological: Negative for headaches.       One episode of dizziness with postural change. Denies syncope, near syncope.       Past Medical History  Diagnosis Date  . Diabetes mellitus without complication   . Hypertension   . Aortic insufficiency     a. TTE 11/30/14: EF 25-30%, bicupid valve cannot be excluded, mod-severe aortic regurge, aortic root dimension 52 mm, mild MR, mildly dilated LA/RA, PASP 60; TEE 12/01/14: EF 25-30%, diffuse HK, aortic valve trileaflet, mild to mod Ao regurge, ascending aortic grossly measured at 4.5 cm (not well visualized),   . Nonischemic dilated cardiomyopathy     a. cath 12/02/14: right dom coronary system w/ mild lumenal irregs, o/w no sig CAD, sev diffuse HK EF 25%, no sig MR or AS, med Rx recommeded   . Chronic systolic CHF (congestive heart failure)     a. TTE 11/30/14: EF 25-30%, bicupid valve cannot be  excluded, mod-severe aortic regurge, aortic root dimension 52 mm, mild MR, mildly dilated LA/RA, PASP 60; TEE 12/01/14: EF 25-30%, diffuse HK, aortic valve trileaflet, mild to mod Ao regurge, ascending aortic grossly measured at 4.5 cm (not well visualized),   . Ascending aorta dilatation     a. 52 mm by TTE 11/2014; b. 45 mm by TEE 11/2014 (not well visualized)    History   Social History  . Marital Status: Married    Spouse Name: N/A  . Number of Children: N/A  . Years of Education: N/A   Occupational History  . Not on file.   Social History Main Topics  . Smoking status: Never Smoker   . Smokeless tobacco: Not on file  . Alcohol Use: No  . Drug Use: No  . Sexual Activity: Not on file   Other Topics Concern  . Not on file   Social History Narrative   Married.   Works as a Engineer, structural at [employer].   No children.   Enjoys playing guitar, traveling, spending time with his wife.    Past Surgical History  Procedure Laterality Date  . Other surgical history      none  . Cardiac catheterization N/A 12/02/2014    Procedure: Left Heart Cath and Coronary Angiography;  Surgeon: Antonieta Iba, MD;  Location: [facility];  Service: Cardiovascular;  Laterality: N/A;  . Coronary angioplasty  Family History  Problem Relation Age of Onset  . Coronary artery disease Mother   . Hypertension Father   . Diabetes Mellitus II Paternal Uncle     No Known Allergies  Current Outpatient Prescriptions on File Prior to Visit  Medication Sig Dispense Refill  . aspirin 81 MG tablet Take 81 mg by mouth daily.    Marland Kitchen atorvastatin (LIPITOR) 40 MG tablet Take 1 tablet (40 mg total) by mouth at bedtime. 30 tablet 5  . carvedilol (COREG) 6.25 MG tablet Take 1 tablet (6.25 mg total) by mouth 2 (two) times daily with a meal. 60 tablet 11  . furosemide (LASIX) 40 MG tablet TAKE 1 TABLET (40 MG TOTAL) BY MOUTH 2 (TWO) TIMES DAILY. 60 tablet 0  . losartan (COZAAR) 100 MG tablet Take 1  tablet (100 mg total) by mouth daily. 90 tablet 3  . metFORMIN (GLUCOPHAGE) 500 MG tablet Take 1 tablet (500 mg total) by mouth 2 (two) times daily. 60 tablet 5  . spironolactone (ALDACTONE) 25 MG tablet Take 1 tablet (25 mg total) by mouth 2 (two) times daily. 60 tablet 3   Current Facility-Administered Medications on File Prior to Visit  Medication Dose Route Frequency Provider Last Rate Last Dose  . acetaminophen (TYLENOL) tablet 650 mg  650 mg Oral Q4H PRN Antonieta Iba, MD        BP 144/78 mmHg  Pulse 71  Temp(Src) 98.8 F (37.1 C) (Oral)  Ht 6' (1.829 m)  Wt 221 lb 6.4 oz (100.426 kg)  BMI 30.02 kg/m2  SpO2 97%    Objective:   Physical Exam  Constitutional: He appears well-nourished.  Cardiovascular: Normal rate and regular rhythm.   Pulmonary/Chest: Effort normal and breath sounds normal.  Skin: Skin is warm and dry.          Assessment & Plan:

## 2015-02-08 NOTE — Progress Notes (Signed)
Pre visit review using our clinic review tool, if applicable. No additional management support is needed unless otherwise documented below in the visit note. 

## 2015-02-08 NOTE — Assessment & Plan Note (Signed)
Recent increase in spironolactone dose.  Check BMP today

## 2015-02-08 NOTE — Assessment & Plan Note (Signed)
Followed up in mid July with CHF clinic, no new changes. Next appointment in October.

## 2015-02-21 ENCOUNTER — Other Ambulatory Visit: Payer: Self-pay | Admitting: Primary Care

## 2015-02-22 NOTE — Telephone Encounter (Signed)
Electronically refill request  Last prescribed on 01/28/2015  furosemide (LASIX) 40 MG tablet Dispense: 60 tablet   Refills: 0    Last seen on 02/08/2015. Next apt on 03/04/15.

## 2015-02-26 ENCOUNTER — Ambulatory Visit (INDEPENDENT_AMBULATORY_CARE_PROVIDER_SITE_OTHER): Payer: Managed Care, Other (non HMO)

## 2015-02-26 ENCOUNTER — Other Ambulatory Visit: Payer: Self-pay

## 2015-02-26 DIAGNOSIS — I712 Thoracic aortic aneurysm, without rupture: Secondary | ICD-10-CM | POA: Diagnosis not present

## 2015-02-26 DIAGNOSIS — I7121 Aneurysm of the ascending aorta, without rupture: Secondary | ICD-10-CM

## 2015-02-26 DIAGNOSIS — I359 Nonrheumatic aortic valve disorder, unspecified: Secondary | ICD-10-CM

## 2015-03-04 ENCOUNTER — Ambulatory Visit (INDEPENDENT_AMBULATORY_CARE_PROVIDER_SITE_OTHER): Payer: Managed Care, Other (non HMO) | Admitting: Primary Care

## 2015-03-04 ENCOUNTER — Encounter: Payer: Self-pay | Admitting: Primary Care

## 2015-03-04 VITALS — BP 138/66 | HR 77 | Temp 98.2°F | Ht 72.0 in | Wt 222.1 lb

## 2015-03-04 DIAGNOSIS — E785 Hyperlipidemia, unspecified: Secondary | ICD-10-CM | POA: Diagnosis not present

## 2015-03-04 DIAGNOSIS — I10 Essential (primary) hypertension: Secondary | ICD-10-CM

## 2015-03-04 DIAGNOSIS — E876 Hypokalemia: Secondary | ICD-10-CM

## 2015-03-04 DIAGNOSIS — E119 Type 2 diabetes mellitus without complications: Secondary | ICD-10-CM

## 2015-03-04 LAB — MICROALBUMIN / CREATININE URINE RATIO
Creatinine,U: 110.3 mg/dL
Microalb Creat Ratio: 0.6 mg/g (ref 0.0–30.0)
Microalb, Ur: <0.7 mg/dL (ref 0.0–1.9)

## 2015-03-04 LAB — HEMOGLOBIN A1C: Hgb A1c MFr Bld: 7.9 % — ABNORMAL HIGH (ref 4.6–6.5)

## 2015-03-04 MED ORDER — METFORMIN HCL 500 MG PO TABS: 500.0000 mg | ORAL_TABLET | Freq: Two times a day (BID) | ORAL | Status: DC

## 2015-03-04 MED ORDER — ATORVASTATIN CALCIUM 40 MG PO TABS: 40.0000 mg | ORAL_TABLET | Freq: Every day | ORAL | Status: DC

## 2015-03-04 NOTE — Assessment & Plan Note (Signed)
A1C due today and pending. Will also obtain urine microalbumin today. Foot exam completed last visit. Denies numbness/tingling. Discussed diet and exercise. Follow up in 3 months.

## 2015-03-04 NOTE — Assessment & Plan Note (Signed)
Stable today. Continue current regimen. BMP last visit unremarkable.

## 2015-03-04 NOTE — Patient Instructions (Signed)
Complete lab work prior to leaving today. I will notify you of your results.  Continue your efforts towards a healthy diet and exercise.  Follow up with Cardiology in September and CHF Clinic in October.  Follow up with me in 3 months for re-evaluation of blood pressure and diabetes.   It was a pleasure to see you today!

## 2015-03-04 NOTE — Assessment & Plan Note (Signed)
BMP last visit WNL.

## 2015-03-04 NOTE — Progress Notes (Signed)
Subjective:    Patient ID: Dennis Curry, male    DOB: [birth year], 49 y.o.   MRN: [id number]  HPI  Dennis Curry is a 49 year old male who presents today for follow up of diabetes and hypertension:  1) Type 2 Diabetes: Diagnosed in 2010 and is managed on Metformin 500 mg twice daily. Last A1C was 7.4 on 11/30/14. He is checking his blood sugars once weekly and is getting 100-110 mainly. He is also managed on an ARB and statin. He is due for A1C recheck today. No urine micro albumin on file.  Endorses healthy diet which consists of: Breakfast: Egg and toast Lunch: Salad, vegetables. Occasional sub sandwich. Dinner: Chicken (baked), hamburger steak, vegetables, salads, some rice. Snacks: Not much snacking, some nuts. Beverages: Mostly water, 1 coke zero. No sodas, occasional sweet tea.  Exercise: He is walking daily with his fitness app.  2) Hypertension: Currently managed on Losartan 100 mg, Spironolactone 25 mg BID, and Carvedilol 6.25 mg. BMP completed last visit due to increase in Spironolactone and was WNL. He has follow up scheduled with cardiology in mid September. He is checking his blood pressure at home and is getting 130's/80's. Denies chest pain, SOB, blurred vision.   BP Readings from Last 3 Encounters:  03/04/15 138/66  02/08/15 138/72  01/25/15 149/77     Review of Systems  Respiratory: Negative for shortness of breath.   Cardiovascular: Negative for chest pain.  Neurological: Negative for numbness.       Occasional dizziness with position change.       Past Medical History  Diagnosis Date  . Diabetes mellitus without complication   . Hypertension   . Aortic insufficiency     a. TTE 11/30/14: EF 25-30%, bicupid valve cannot be excluded, mod-severe aortic regurge, aortic root dimension 52 mm, mild MR, mildly dilated LA/RA, PASP 60; TEE 12/01/14: EF 25-30%, diffuse HK, aortic valve trileaflet, mild to mod Ao regurge, ascending aortic grossly measured at 4.5 cm (not  well visualized),   . Nonischemic dilated cardiomyopathy     a. cath 12/02/14: right dom coronary system w/ mild lumenal irregs, o/w no sig CAD, sev diffuse HK EF 25%, no sig MR or AS, med Rx recommeded   . Chronic systolic CHF (congestive heart failure)     a. TTE 11/30/14: EF 25-30%, bicupid valve cannot be excluded, mod-severe aortic regurge, aortic root dimension 52 mm, mild MR, mildly dilated LA/RA, PASP 60; TEE 12/01/14: EF 25-30%, diffuse HK, aortic valve trileaflet, mild to mod Ao regurge, ascending aortic grossly measured at 4.5 cm (not well visualized),   . Ascending aorta dilatation     a. 52 mm by TTE 11/2014; b. 45 mm by TEE 11/2014 (not well visualized)    Social History   Social History  . Marital Status: Married    Spouse Name: N/A  . Number of Children: N/A  . Years of Education: N/A   Occupational History  . Not on file.   Social History Main Topics  . Smoking status: Never Smoker   . Smokeless tobacco: Not on file  . Alcohol Use: No  . Drug Use: No  . Sexual Activity: Not on file   Other Topics Concern  . Not on file   Social History Narrative   Married.   Works as a Engineer, structural at [employer].   No children.   Enjoys playing guitar, traveling, spending time with his wife.    Past Surgical History  Procedure Laterality Date  .  Other surgical history      none  . Cardiac catheterization N/A 12/02/2014    Procedure: Left Heart Cath and Coronary Angiography;  Surgeon: Antonieta Iba, MD;  Location: [facility];  Service: Cardiovascular;  Laterality: N/A;  . Coronary angioplasty      Family History  Problem Relation Age of Onset  . Coronary artery disease Mother   . Hypertension Father   . Diabetes Mellitus II Paternal Uncle     No Known Allergies  Current Outpatient Prescriptions on File Prior to Visit  Medication Sig Dispense Refill  . aspirin 81 MG tablet Take 81 mg by mouth daily.    . carvedilol (COREG) 6.25 MG tablet Take 1 tablet  (6.25 mg total) by mouth 2 (two) times daily with a meal. 60 tablet 11  . furosemide (LASIX) 40 MG tablet TAKE 1 TABLET BY MOUTH TWICE A DAY 60 tablet 0  . losartan (COZAAR) 100 MG tablet Take 1 tablet (100 mg total) by mouth daily. 90 tablet 3  . spironolactone (ALDACTONE) 25 MG tablet Take 1 tablet (25 mg total) by mouth 2 (two) times daily. 60 tablet 3   Current Facility-Administered Medications on File Prior to Visit  Medication Dose Route Frequency Provider Last Rate Last Dose  . acetaminophen (TYLENOL) tablet 650 mg  650 mg Oral Q4H PRN Antonieta Iba, MD        BP 138/66 mmHg  Pulse 77  Temp(Src) 98.2 F (36.8 C) (Oral)  Ht 6' (1.829 m)  Wt 222 lb 1.9 oz (100.753 kg)  BMI 30.12 kg/m2  SpO2 98%    Objective:   Physical Exam  Constitutional: He appears well-nourished.  Cardiovascular: Normal rate and regular rhythm.   Pulmonary/Chest: Effort normal and breath sounds normal.  Skin: Skin is warm and dry.  Psychiatric: He has a normal mood and affect.          Assessment & Plan:

## 2015-03-04 NOTE — Progress Notes (Signed)
Pre visit review using our clinic review tool, if applicable. No additional management support is needed unless otherwise documented below in the visit note. 

## 2015-03-08 MED ORDER — SPIRONOLACTONE 25 MG PO TABS: 25.0000 mg | ORAL_TABLET | Freq: Two times a day (BID) | ORAL | Status: DC

## 2015-03-08 NOTE — Addendum Note (Signed)
Addended by: Tawnya Crook on: 03/08/2015 01:58 PM   Modules accepted: Orders

## 2015-03-20 ENCOUNTER — Other Ambulatory Visit: Payer: Self-pay | Admitting: Primary Care

## 2015-03-26 ENCOUNTER — Encounter: Payer: Self-pay | Admitting: Cardiovascular Disease

## 2015-03-26 ENCOUNTER — Ambulatory Visit (INDEPENDENT_AMBULATORY_CARE_PROVIDER_SITE_OTHER): Payer: Managed Care, Other (non HMO) | Admitting: Cardiovascular Disease

## 2015-03-26 VITALS — BP 130/70 | HR 84 | Ht 72.0 in | Wt 225.5 lb

## 2015-03-26 DIAGNOSIS — E1159 Type 2 diabetes mellitus with other circulatory complications: Secondary | ICD-10-CM

## 2015-03-26 DIAGNOSIS — E876 Hypokalemia: Secondary | ICD-10-CM

## 2015-03-26 DIAGNOSIS — E785 Hyperlipidemia, unspecified: Secondary | ICD-10-CM | POA: Diagnosis not present

## 2015-03-26 DIAGNOSIS — I7121 Aneurysm of the ascending aorta, without rupture: Secondary | ICD-10-CM

## 2015-03-26 DIAGNOSIS — I5022 Chronic systolic (congestive) heart failure: Secondary | ICD-10-CM | POA: Diagnosis not present

## 2015-03-26 DIAGNOSIS — I10 Essential (primary) hypertension: Secondary | ICD-10-CM | POA: Diagnosis not present

## 2015-03-26 DIAGNOSIS — I712 Thoracic aortic aneurysm, without rupture: Secondary | ICD-10-CM

## 2015-03-26 MED ORDER — SPIRONOLACTONE 25 MG PO TABS: 25.0000 mg | ORAL_TABLET | Freq: Two times a day (BID) | ORAL | Status: DC

## 2015-03-26 MED ORDER — ATORVASTATIN CALCIUM 40 MG PO TABS: 40.0000 mg | ORAL_TABLET | Freq: Every day | ORAL | Status: DC

## 2015-03-26 MED ORDER — FUROSEMIDE 40 MG PO TABS: 40.0000 mg | ORAL_TABLET | Freq: Two times a day (BID) | ORAL | Status: DC

## 2015-03-26 MED ORDER — LOSARTAN POTASSIUM 100 MG PO TABS: 100.0000 mg | ORAL_TABLET | Freq: Every day | ORAL | Status: DC

## 2015-03-26 MED ORDER — CARVEDILOL 6.25 MG PO TABS: 6.2500 mg | ORAL_TABLET | Freq: Two times a day (BID) | ORAL | Status: DC

## 2015-03-26 NOTE — Assessment & Plan Note (Signed)
Improved ejection fraction on recent echocardiogram, borderline low Overall he is doing well. Occasional episodes of orthostasis, likely exacerbated by hot weather. Given the symptoms, we will not make any medication changes at this time. Spent some of the visit talking about when it might be time to decrease his Lasix dose down to 40 mg daily. If weight stays stable at 220 pounds, his home weight, will continue current dose. If weight drops, BMP could be done, Lasix could be cut back to 40 mg daily for climb in BUN and creatinine.

## 2015-03-26 NOTE — Patient Instructions (Addendum)
You are doing well. No medication changes were made.  Please hold the lasix if your weight drops much less than 220 Extra lasix 80 mg twice a day for weight 225  Please call us if you have new issues that need to be addressed before your next appt.  Your physician wants you to follow-up in: 12 months.  You will receive a reminder letter in the mail two months in advance. If you don't receive a letter, please call our office to schedule the follow-up appointment.

## 2015-03-26 NOTE — Assessment & Plan Note (Signed)
We have encouraged continued exercise, careful diet management in an effort to lose weight. 

## 2015-03-26 NOTE — Assessment & Plan Note (Signed)
We'll schedule repeat echocardiogram in one year to evaluate his ascending aorta aneurysm Measured 4.4 up to 4.5 cm on transesophageal echo He has a tricuspid aortic valve

## 2015-03-26 NOTE — Progress Notes (Signed)
Patient ID: Dennis Curry, male    DOB: 07-12-1965, 49 y.o.   MRN: [id number]  HPI Comments: 49 year old gentleman who in early 2016 was doing well, developed progressive chest congestion initially felt to be allergies, treated with several rounds of ABX,  presenting to the hospital with worsening shortness of breath found to have acute systolic CHF, EF 40%, dilated ascending aorta, cardiac catheterization showing no significant CAD, aorta 4.5 cm on TEE, who presents for routine follow-up of his systolic CHF.  Recent echocardiogram shows ejection fraction has improved up to 45-50%. Overall he feels well. Does report having some periods of orthostasis. Better as the weather has been cooler. Drinks water, ice tea. Reports he is trying to watch his diet. Weight at home 220 pounds Hemoglobin A1c reviewed with him, 7.9 In the past did not have leg edema, he had abdominal bloating, bloating in his chest and face. Denies having any of the symptoms now. No significant shortness of breath on exertion Potassium improved on Aldactone   No Known Allergies  Current Outpatient Prescriptions on File Prior to Visit  Medication Sig Dispense Refill  . aspirin 81 MG tablet Take 81 mg by mouth daily.    Marland Kitchen atorvastatin (LIPITOR) 40 MG tablet Take 1 tablet (40 mg total) by mouth at bedtime. 90 tablet 2  . carvedilol (COREG) 6.25 MG tablet Take 1 tablet (6.25 mg total) by mouth 2 (two) times daily with a meal. 60 tablet 11  . furosemide (LASIX) 40 MG tablet TAKE 1 TABLET BY MOUTH TWICE A DAY 60 tablet 0  . losartan (COZAAR) 100 MG tablet Take 1 tablet (100 mg total) by mouth daily. 90 tablet 3  . metFORMIN (GLUCOPHAGE) 500 MG tablet Take 1 tablet (500 mg total) by mouth 2 (two) times daily. 180 tablet 2  . spironolactone (ALDACTONE) 25 MG tablet Take 1 tablet (25 mg total) by mouth 2 (two) times daily. 180 tablet 2   Current Facility-Administered Medications on File Prior to Visit  Medication Dose Route  Frequency Provider Last Rate Last Dose  . acetaminophen (TYLENOL) tablet 650 mg  650 mg Oral Q4H PRN Antonieta Iba, MD        Past Medical History  Diagnosis Date  . Diabetes mellitus without complication   . Hypertension   . Aortic insufficiency     a. TTE 11/30/14: EF 25-30%, bicupid valve cannot be excluded, mod-severe aortic regurge, aortic root dimension 52 mm, mild MR, mildly dilated LA/RA, PASP 60; TEE 12/01/14: EF 25-30%, diffuse HK, aortic valve trileaflet, mild to mod Ao regurge, ascending aortic grossly measured at 4.5 cm (not well visualized),   . Nonischemic dilated cardiomyopathy     a. cath 12/02/14: right dom coronary system w/ mild lumenal irregs, o/w no sig CAD, sev diffuse HK EF 25%, no sig MR or AS, med Rx recommeded   . Chronic systolic CHF (congestive heart failure)     a. TTE 11/30/14: EF 25-30%, bicupid valve cannot be excluded, mod-severe aortic regurge, aortic root dimension 52 mm, mild MR, mildly dilated LA/RA, PASP 60; TEE 12/01/14: EF 25-30%, diffuse HK, aortic valve trileaflet, mild to mod Ao regurge, ascending aortic grossly measured at 4.5 cm (not well visualized),   . Ascending aorta dilatation     a. 52 mm by TTE 11/2014; b. 45 mm by TEE 11/2014 (not well visualized)    Past Surgical History  Procedure Laterality Date  . Other surgical history      none  . Cardiac  catheterization N/A 12/02/2014    Procedure: Left Heart Cath and Coronary Angiography;  Surgeon: Antonieta Iba, MD;  Location: [facility];  Service: Cardiovascular;  Laterality: N/A;  . Coronary angioplasty      Social History  reports that he has never smoked. He does not have any smokeless tobacco history on file. He reports that he does not drink alcohol or use illicit drugs.  Family History family history includes Coronary artery disease in his mother; Diabetes Mellitus II in his paternal uncle; Hypertension in his father.   Review of Systems  Constitutional: Negative.    Respiratory: Negative.   Cardiovascular: Negative.   Gastrointestinal: Negative.   Musculoskeletal: Negative.   Neurological: Positive for dizziness.  Hematological: Negative.   Psychiatric/Behavioral: Negative.   All other systems reviewed and are negative.   BP 130/70 mmHg  Pulse 84  Ht 6' (1.829 m)  Wt 225 lb 8 oz (102.286 kg)  BMI 30.58 kg/m2   Physical Exam  Constitutional: He is oriented to person, place, and time. He appears well-developed and well-nourished.  HENT:  Head: Normocephalic.  Nose: Nose normal.  Mouth/Throat: Oropharynx is clear and moist.  Eyes: Conjunctivae are normal. Pupils are equal, round, and reactive to light.  Neck: Normal range of motion. Neck supple. No JVD present.  Cardiovascular: Normal rate, regular rhythm, normal heart sounds and intact distal pulses.  Exam reveals no gallop and no friction rub.   No murmur heard. Pulmonary/Chest: Effort normal and breath sounds normal. No respiratory distress. He has no wheezes. He has no rales. He exhibits no tenderness.  Abdominal: Soft. Bowel sounds are normal. He exhibits no distension. There is no tenderness.  Musculoskeletal: Normal range of motion. He exhibits no edema or tenderness.  Lymphadenopathy:    He has no cervical adenopathy.  Neurological: He is alert and oriented to person, place, and time. Coordination normal.  Skin: Skin is warm and dry. No rash noted. No erythema.  Psychiatric: He has a normal mood and affect. His behavior is normal. Judgment and thought content normal.

## 2015-03-26 NOTE — Assessment & Plan Note (Signed)
Stable potassium, will continue Aldactone

## 2015-03-26 NOTE — Assessment & Plan Note (Signed)
Blood pressure is well controlled on today's visit. No changes made to the medications. 

## 2015-04-27 ENCOUNTER — Ambulatory Visit: Payer: Managed Care, Other (non HMO) | Attending: Family | Admitting: Family

## 2015-04-27 ENCOUNTER — Encounter: Payer: Self-pay | Admitting: Family

## 2015-04-27 VITALS — BP 134/94 | HR 82 | Resp 20 | Ht 72.0 in | Wt 229.0 lb

## 2015-04-27 DIAGNOSIS — I5022 Chronic systolic (congestive) heart failure: Secondary | ICD-10-CM | POA: Insufficient documentation

## 2015-04-27 DIAGNOSIS — Z7982 Long term (current) use of aspirin: Secondary | ICD-10-CM | POA: Diagnosis not present

## 2015-04-27 DIAGNOSIS — I351 Nonrheumatic aortic (valve) insufficiency: Secondary | ICD-10-CM | POA: Diagnosis not present

## 2015-04-27 DIAGNOSIS — I420 Dilated cardiomyopathy: Secondary | ICD-10-CM | POA: Diagnosis not present

## 2015-04-27 DIAGNOSIS — E119 Type 2 diabetes mellitus without complications: Secondary | ICD-10-CM | POA: Diagnosis not present

## 2015-04-27 DIAGNOSIS — Z7984 Long term (current) use of oral hypoglycemic drugs: Secondary | ICD-10-CM | POA: Insufficient documentation

## 2015-04-27 DIAGNOSIS — E1159 Type 2 diabetes mellitus with other circulatory complications: Secondary | ICD-10-CM

## 2015-04-27 DIAGNOSIS — Z79899 Other long term (current) drug therapy: Secondary | ICD-10-CM | POA: Insufficient documentation

## 2015-04-27 DIAGNOSIS — I10 Essential (primary) hypertension: Secondary | ICD-10-CM | POA: Insufficient documentation

## 2015-04-27 NOTE — Progress Notes (Signed)
Subjective:    Patient ID: Dennis Curry, male    DOB: 1965/09/06, 49 y.o.   MRN: [id number]  Congestive Heart Failure Presents for follow-up visit. The disease course has been stable. Associated symptoms include fatigue (very Lant). Pertinent negatives include no abdominal pain, chest pain, edema, orthopnea, palpitations or shortness of breath. The symptoms have been stable. Past treatments include beta blockers, salt and fluid restriction, angiotensin receptor blockers and aldosterone receptor blockers. The treatment provided significant relief. Compliance with prior treatments has been good. His past medical history is significant for DM, HTN and valvular heart disease. He has one 1st degree relative with heart disease. Compliance with total regimen is 76-100%.  Hypertension This is a chronic problem. The current episode started more than 1 month ago. The problem is unchanged. Associated symptoms include malaise/fatigue. Pertinent negatives include no anxiety, chest pain, headaches, neck pain, palpitations, peripheral edema or shortness of breath. There are no associated agents to hypertension. Risk factors for coronary artery disease include diabetes mellitus, family history, dyslipidemia and male gender. Past treatments include angiotensin blockers, beta blockers, diuretics and lifestyle changes. The current treatment provides significant improvement. There are no compliance problems.  Hypertensive end-organ damage includes heart failure.    Past Medical History  Diagnosis Date  . Diabetes mellitus without complication (HCC)   . Hypertension   . Aortic insufficiency     a. TTE 11/30/14: EF 25-30%, bicupid valve cannot be excluded, mod-severe aortic regurge, aortic root dimension 52 mm, mild MR, mildly dilated LA/RA, PASP 60; TEE 12/01/14: EF 25-30%, diffuse HK, aortic valve trileaflet, mild to mod Ao regurge, ascending aortic grossly measured at 4.5 cm (not well visualized),   . Nonischemic  dilated cardiomyopathy (HCC)     a. cath 12/02/14: right dom coronary system w/ mild lumenal irregs, o/w no sig CAD, sev diffuse HK EF 25%, no sig MR or AS, med Rx recommeded   . Chronic systolic CHF (congestive heart failure) (HCC)     a. TTE 11/30/14: EF 25-30%, bicupid valve cannot be excluded, mod-severe aortic regurge, aortic root dimension 52 mm, mild MR, mildly dilated LA/RA, PASP 60; TEE 12/01/14: EF 25-30%, diffuse HK, aortic valve trileaflet, mild to mod Ao regurge, ascending aortic grossly measured at 4.5 cm (not well visualized),   . Ascending aorta dilatation (HCC)     a. 52 mm by TTE 11/2014; b. 45 mm by TEE 11/2014 (not well visualized)    Past Surgical History  Procedure Laterality Date  . Other surgical history      none  . Cardiac catheterization N/A 12/02/2014    Procedure: Left Heart Cath and Coronary Angiography;  Surgeon: Antonieta Iba, MD;  Location: [facility];  Service: Cardiovascular;  Laterality: N/A;  . Coronary angioplasty      Family History  Problem Relation Age of Onset  . Coronary artery disease Mother   . Hypertension Father   . Diabetes Mellitus II Paternal Uncle     Social History  Substance Use Topics  . Smoking status: Never Smoker   . Smokeless tobacco: Not on file  . Alcohol Use: No    No Known Allergies  Prior to Admission medications   Medication Sig Start Date End Date Taking? Authorizing Provider  aspirin 81 MG tablet Take 81 mg by mouth daily.   Yes Historical Provider, MD  atorvastatin (LIPITOR) 40 MG tablet Take 1 tablet (40 mg total) by mouth at bedtime. 03/26/15  Yes Antonieta Iba, MD  carvedilol (  COREG) 6.25 MG tablet Take 1 tablet (6.25 mg total) by mouth 2 (two) times daily with a meal. 03/26/15  Yes Antonieta Iba, MD  furosemide (LASIX) 40 MG tablet Take 1 tablet (40 mg total) by mouth 2 (two) times daily. 03/26/15  Yes Antonieta Iba, MD  losartan (COZAAR) 100 MG tablet Take 1 tablet (100 mg total) by mouth  daily. 03/26/15  Yes Antonieta Iba, MD  metFORMIN (GLUCOPHAGE) 500 MG tablet Take 1 tablet (500 mg total) by mouth 2 (two) times daily. 03/04/15  Yes [name], NP  spironolactone (ALDACTONE) 25 MG tablet Take 1 tablet (25 mg total) by mouth 2 (two) times daily. 03/26/15  Yes Antonieta Iba, MD     Review of Systems  Constitutional: Positive for malaise/fatigue and fatigue (very Looney). Negative for appetite change.  HENT: Negative for congestion, postnasal drip and sore throat.   Eyes: Negative for pain and visual disturbance.  Respiratory: Negative for cough, chest tightness and shortness of breath.   Cardiovascular: Negative for chest pain, palpitations and leg swelling.  Gastrointestinal: Negative for abdominal pain and abdominal distention.  Endocrine: Negative.   Genitourinary: Negative.   Musculoskeletal: Negative for back pain and neck pain.  Skin: Negative.   Allergic/Immunologic: Negative.   Neurological: Negative for dizziness, light-headedness and headaches.  Hematological: Negative for adenopathy. Does not bruise/bleed easily.  Psychiatric/Behavioral: Negative for sleep disturbance (sleeping on 1 1/2 pillows) and dysphoric mood. The patient is not nervous/anxious.        Objective:   Physical Exam  Constitutional: He is oriented to person, place, and time. He appears well-developed and well-nourished.  HENT:  Head: Normocephalic and atraumatic.  Eyes: Conjunctivae are normal. Pupils are equal, round, and reactive to light.  Neck: Normal range of motion. Neck supple.  Cardiovascular: Normal rate and regular rhythm.   Pulmonary/Chest: Effort normal. He has no wheezes. He has no rales.  Abdominal: Soft. He exhibits no distension. There is no tenderness.  Musculoskeletal: He exhibits no edema or tenderness.  Neurological: He is alert and oriented to person, place, and time.  Skin: Skin is warm and dry.  Psychiatric: He has a normal mood and affect. His  behavior is normal. Thought content normal.  Nursing note and vitals reviewed.   BP 134/94 mmHg  Pulse 82  Resp 20  Ht 6' (1.829 m)  Wt 229 lb (103.874 kg)  BMI 31.05 kg/m2  SpO2 99%       Assessment & Plan:  1: Chronic heart failure with reduced ejection fraction- Patient presents with minimal fatigue upon exertion. He overall feels like his energy level is doing well. Denies any shortness of breath or edema. Continues to weigh himself and has noted a gradual weight gain. Denies any overnight weight gains and he was reminded to call for an overnight weight gain of >2 pounds or a weekly weight gain of >5 pounds. He is not adding any salt to his foods and has been reading food labels carefully. Is very active and is tolerating his medications without known side effects.  2: HTN- Blood pressure a Gueye elevated this morning but patient has been talking quite a bit. Previous readings have been good and he follows up with his PCP frequently due to his diabetes. Could also be slightly elevated due to weight gain. 3: Diabetes- He says that his glucose this morning was 100 but that his most recent A1C had gone up over 7% and he's been working on getting  that back down. Continues to take glucophage and has has been watching his diet more closely.  Return in 1 year or sooner for any questions/problems before then.

## 2015-04-27 NOTE — Patient Instructions (Signed)
Continue weighing daily and call for an overnight weight gain of > 2 pounds or a weekly weight gain of >5 pounds. 

## 2015-06-08 ENCOUNTER — Ambulatory Visit (INDEPENDENT_AMBULATORY_CARE_PROVIDER_SITE_OTHER): Payer: Managed Care, Other (non HMO) | Admitting: Primary Care

## 2015-06-08 ENCOUNTER — Encounter: Payer: Self-pay | Admitting: Primary Care

## 2015-06-08 ENCOUNTER — Other Ambulatory Visit: Payer: Self-pay | Admitting: Primary Care

## 2015-06-08 VITALS — BP 136/80 | HR 75 | Temp 98.7°F | Ht 72.0 in | Wt 230.4 lb

## 2015-06-08 DIAGNOSIS — E119 Type 2 diabetes mellitus without complications: Secondary | ICD-10-CM | POA: Diagnosis not present

## 2015-06-08 DIAGNOSIS — IMO0001 Reserved for inherently not codable concepts without codable children: Secondary | ICD-10-CM

## 2015-06-08 DIAGNOSIS — E1165 Type 2 diabetes mellitus with hyperglycemia: Principal | ICD-10-CM

## 2015-06-08 LAB — HEMOGLOBIN A1C: Hgb A1c MFr Bld: 9.1 % — ABNORMAL HIGH (ref 4.6–6.5)

## 2015-06-08 MED ORDER — GLIPIZIDE ER 5 MG PO TB24: 5.0000 mg | ORAL_TABLET | Freq: Every day | ORAL | Status: DC

## 2015-06-08 MED ORDER — METFORMIN HCL 1000 MG PO TABS: 1000.0000 mg | ORAL_TABLET | Freq: Two times a day (BID) | ORAL | Status: DC

## 2015-06-08 NOTE — Progress Notes (Signed)
Subjective:    Patient ID: Dennis PeersWilliam Curry, male    DOB: 09/10/1965, 49 y.o.   MRN: [id number]  HPI  Dennis Curry is a 49 year old male who presents today for follow up of diabetes. He was last evaluated in August 2016 with an A1C of 7.9. He is currently managed on Metformin 500 mg twice daily. Last visit he wanted to work to improve his diet before we increased his Metformin. BMP from August 2016 with normal renal function and electrolytes.  He's checking his sugars once weekly in the morning before breakfast with readings on average of 100-120. Denies low's below 100 or high's above 150. Denies numbness/tingling, chest pain, dizziness.   His diet currently consists of: Breakfast: Egg and toast Lunch: Salad Dinner: Chicken, potatoes, vegetables, occasional fried foods Snacks: Fruit, nuts Desserts: Once monthly Beverages: Green tea, coke zero, water mostly.  Exercising: He is not currently exercising  Review of Systems  Respiratory: Negative for shortness of breath.   Cardiovascular: Negative for chest pain.  Gastrointestinal: Negative for nausea.  Neurological: Negative for dizziness, numbness and headaches.       Past Medical History  Diagnosis Date  . Diabetes mellitus without complication (HCC)   . Hypertension   . Aortic insufficiency     a. TTE 11/30/14: EF 25-30%, bicupid valve cannot be excluded, mod-severe aortic regurge, aortic root dimension 52 mm, mild MR, mildly dilated LA/RA, PASP 60; TEE 12/01/14: EF 25-30%, diffuse HK, aortic valve trileaflet, mild to mod Ao regurge, ascending aortic grossly measured at 4.5 cm (not well visualized),   . Nonischemic dilated cardiomyopathy (HCC)     a. cath 12/02/14: right dom coronary system w/ mild lumenal irregs, o/w no sig CAD, sev diffuse HK EF 25%, no sig MR or AS, med Rx recommeded   . Chronic systolic CHF (congestive heart failure) (HCC)     a. TTE 11/30/14: EF 25-30%, bicupid valve cannot be excluded, mod-severe aortic regurge,  aortic root dimension 52 mm, mild MR, mildly dilated LA/RA, PASP 60; TEE 12/01/14: EF 25-30%, diffuse HK, aortic valve trileaflet, mild to mod Ao regurge, ascending aortic grossly measured at 4.5 cm (not well visualized),   . Ascending aorta dilatation (HCC)     a. 52 mm by TTE 11/2014; b. 45 mm by TEE 11/2014 (not well visualized)    Social History   Social History  . Marital Status: Married    Spouse Name: N/A  . Number of Children: N/A  . Years of Education: N/A   Occupational History  . Not on file.   Social History Main Topics  . Smoking status: Never Smoker   . Smokeless tobacco: Not on file  . Alcohol Use: No  . Drug Use: No  . Sexual Activity: Not on file   Other Topics Concern  . Not on file   Social History Narrative   Married.   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   No children.   Enjoys playing guitar, traveling, spending time with his wife.    Past Surgical History  Procedure Laterality Date  . Other surgical history      none  . Cardiac catheterization N/A 12/02/2014    Procedure: Left Heart Cath and Coronary Angiography;  Surgeon: Antonieta Ibaimothy J Gollan, MD;  Location: [facility];  Service: Cardiovascular;  Laterality: N/A;  . Coronary angioplasty      Family History  Problem Relation Age of Onset  . Coronary artery disease Mother   .  Hypertension Father   . Diabetes Mellitus II Paternal Uncle     No Known Allergies  Current Outpatient Prescriptions on File Prior to Visit  Medication Sig Dispense Refill  . aspirin 81 MG tablet Take 81 mg by mouth daily.    Marland Kitchen atorvastatin (LIPITOR) 40 MG tablet Take 1 tablet (40 mg total) by mouth at bedtime. 90 tablet 3  . carvedilol (COREG) 6.25 MG tablet Take 1 tablet (6.25 mg total) by mouth 2 (two) times daily with a meal. 180 tablet 3  . furosemide (LASIX) 40 MG tablet Take 1 tablet (40 mg total) by mouth 2 (two) times daily. 90 tablet 3  . losartan (COZAAR) 100 MG tablet Take 1 tablet (100 mg total) by  mouth daily. 90 tablet 3  . metFORMIN (GLUCOPHAGE) 500 MG tablet Take 1 tablet (500 mg total) by mouth 2 (two) times daily. 180 tablet 2  . spironolactone (ALDACTONE) 25 MG tablet Take 1 tablet (25 mg total) by mouth 2 (two) times daily. 180 tablet 3   Current Facility-Administered Medications on File Prior to Visit  Medication Dose Route Frequency Provider Last Rate Last Dose  . acetaminophen (TYLENOL) tablet 650 mg  650 mg Oral Q4H PRN Antonieta Iba, MD        BP 136/80 mmHg  Pulse 75  Temp(Src) 98.7 F (37.1 C) (Oral)  Ht 6' (1.829 m)  Wt 230 lb 6.4 oz (104.509 kg)  BMI 31.24 kg/m2  SpO2 98%    Objective:   Physical Exam  Constitutional: He appears well-nourished.  Cardiovascular: Normal rate and regular rhythm.   Pulmonary/Chest: Effort normal and breath sounds normal.  Skin: Skin is warm and dry.          Assessment & Plan:

## 2015-06-08 NOTE — Patient Instructions (Signed)
It is important that you improve your diet. Please limit carbohydrates in the form of white bread, rice, pasta, fried foods, etc. Increase your consumption of fresh fruits and vegetables.  You need to consume about 2 liters of water daily.  Start exercising. You should be getting 1 hour of moderate intensity exercise 5 days weekly. Work up to 10,000 steps daily on your pedometer.  Please schedule a physical with me in 3 months. You will also schedule a lab only appointment one week prior which will need to be after February 29th. We will discuss your lab results during your physical.  It was a pleasure to see you today!

## 2015-06-08 NOTE — Progress Notes (Signed)
Pre visit review using our clinic review tool, if applicable. No additional management support is needed unless otherwise documented below in the visit note. 

## 2015-06-08 NOTE — Assessment & Plan Note (Signed)
A1C of 7.9 last visit. A1C due today and is pending. If A1C not improved, will adjust medications. BMP stable from August 2016.

## 2015-06-10 ENCOUNTER — Telehealth: Payer: Self-pay | Admitting: Primary Care

## 2015-06-10 ENCOUNTER — Other Ambulatory Visit: Payer: Self-pay | Admitting: Primary Care

## 2015-06-10 DIAGNOSIS — N529 Male erectile dysfunction, unspecified: Secondary | ICD-10-CM

## 2015-06-10 MED ORDER — TADALAFIL 5 MG PO TABS: ORAL_TABLET | ORAL | Status: DC

## 2015-06-10 NOTE — Telephone Encounter (Signed)
Message left for patient to return my call.  

## 2015-06-10 NOTE — Telephone Encounter (Signed)
Will you please notify Mr. Dennis Curry that I spoke with Dr. Mariah Curry and we agreed that it was safe for him to take medication for erectile dysfunction. I have sent in a prescription for Cialis 5 mg. He may take 1 tablet by mouth 30 minutes prior to sexual activity. Please have him be aware that the pill may be good for 3-4 days. Please have him call me with any questions.

## 2015-06-10 NOTE — Telephone Encounter (Signed)
Called and notified patient of Kate's comments. Patient verbalized understanding.  

## 2015-06-22 ENCOUNTER — Other Ambulatory Visit: Payer: Self-pay

## 2015-06-22 MED ORDER — FUROSEMIDE 40 MG PO TABS: 40.0000 mg | ORAL_TABLET | Freq: Two times a day (BID) | ORAL | Status: DC

## 2015-07-26 ENCOUNTER — Other Ambulatory Visit: Payer: Self-pay | Admitting: Primary Care

## 2015-07-26 DIAGNOSIS — N529 Male erectile dysfunction, unspecified: Secondary | ICD-10-CM

## 2015-07-27 ENCOUNTER — Other Ambulatory Visit: Payer: Self-pay | Admitting: Primary Care

## 2015-07-27 NOTE — Telephone Encounter (Signed)
Electronically refill request for   tadalafil (CIALIS) 5 MG tablet   Take 1 tablet by mouth 30 minutes prior to sexual activity.  Dispense: 10 tablet   Refills: 0      Last prescribed on 06/10/2015. Last seen on 06/08/2015. CPE on 09/08/2015.

## 2015-08-27 ENCOUNTER — Other Ambulatory Visit: Payer: Self-pay | Admitting: Primary Care

## 2015-08-27 DIAGNOSIS — I10 Essential (primary) hypertension: Secondary | ICD-10-CM

## 2015-08-27 DIAGNOSIS — E119 Type 2 diabetes mellitus without complications: Secondary | ICD-10-CM

## 2015-08-27 DIAGNOSIS — E785 Hyperlipidemia, unspecified: Secondary | ICD-10-CM

## 2015-09-01 ENCOUNTER — Other Ambulatory Visit: Payer: Self-pay | Admitting: Primary Care

## 2015-09-01 ENCOUNTER — Other Ambulatory Visit (INDEPENDENT_AMBULATORY_CARE_PROVIDER_SITE_OTHER): Payer: Managed Care, Other (non HMO)

## 2015-09-01 ENCOUNTER — Other Ambulatory Visit: Payer: Self-pay | Admitting: *Deleted

## 2015-09-01 DIAGNOSIS — I10 Essential (primary) hypertension: Secondary | ICD-10-CM

## 2015-09-01 DIAGNOSIS — N529 Male erectile dysfunction, unspecified: Secondary | ICD-10-CM

## 2015-09-01 DIAGNOSIS — E119 Type 2 diabetes mellitus without complications: Secondary | ICD-10-CM | POA: Diagnosis not present

## 2015-09-01 DIAGNOSIS — E785 Hyperlipidemia, unspecified: Secondary | ICD-10-CM | POA: Diagnosis not present

## 2015-09-01 LAB — COMPREHENSIVE METABOLIC PANEL
ALT: 18 U/L (ref 0–53)
AST: 12 U/L (ref 0–37)
Albumin: 4.5 g/dL (ref 3.5–5.2)
Alkaline Phosphatase: 75 U/L (ref 39–117)
BUN: 30 mg/dL — AB (ref 6–23)
CO2: 28 meq/L (ref 19–32)
Calcium: 9.9 mg/dL (ref 8.4–10.5)
Chloride: 99 mEq/L (ref 96–112)
Creatinine, Ser: 1.29 mg/dL (ref 0.40–1.50)
GFR: 62.68 mL/min (ref >60.00)
GLUCOSE: 166 mg/dL — AB (ref 70–99)
POTASSIUM: 4.2 meq/L (ref 3.5–5.1)
SODIUM: 136 meq/L (ref 135–145)
Total Bilirubin: 0.9 mg/dL (ref 0.2–1.2)
Total Protein: 7.5 g/dL (ref 6.0–8.3)

## 2015-09-01 LAB — LIPID PANEL
Cholesterol: 123 mg/dL (ref 0–200)
HDL: 28.4 mg/dL — ABNORMAL LOW (ref >39.00)
Total CHOL/HDL Ratio: 4
Triglycerides: 403 mg/dL — ABNORMAL HIGH (ref 0.0–149.0)

## 2015-09-01 LAB — HEMOGLOBIN A1C: Hgb A1c MFr Bld: 7 % — ABNORMAL HIGH (ref 4.6–6.5)

## 2015-09-01 LAB — LDL CHOLESTEROL, DIRECT: Direct LDL: 47 mg/dL

## 2015-09-01 MED ORDER — FUROSEMIDE 40 MG PO TABS: 40.0000 mg | ORAL_TABLET | Freq: Two times a day (BID) | ORAL | Status: DC

## 2015-09-01 MED ORDER — TADALAFIL 5 MG PO TABS: 5.0000 mg | ORAL_TABLET | Freq: Every day | ORAL | Status: DC | PRN

## 2015-09-01 NOTE — Telephone Encounter (Signed)
Requested Prescriptions   Signed Prescriptions Disp Refills  . furosemide (LASIX) 40 MG tablet 180 tablet 3    Sig: Take 1 tablet (40 mg total) by mouth 2 (two) times daily.    Authorizing Provider: GOLLAN, TIMOTHY J    Ordering User: Keari Miu C    

## 2015-09-03 LAB — HM DIABETES EYE EXAM

## 2015-09-08 ENCOUNTER — Encounter: Payer: Self-pay | Admitting: Primary Care

## 2015-09-08 ENCOUNTER — Ambulatory Visit (INDEPENDENT_AMBULATORY_CARE_PROVIDER_SITE_OTHER): Payer: Managed Care, Other (non HMO) | Admitting: Primary Care

## 2015-09-08 VITALS — BP 136/80 | HR 78 | Temp 98.3°F | Ht 72.0 in | Wt 235.8 lb

## 2015-09-08 DIAGNOSIS — I10 Essential (primary) hypertension: Secondary | ICD-10-CM

## 2015-09-08 DIAGNOSIS — E876 Hypokalemia: Secondary | ICD-10-CM | POA: Diagnosis not present

## 2015-09-08 DIAGNOSIS — E1159 Type 2 diabetes mellitus with other circulatory complications: Secondary | ICD-10-CM

## 2015-09-08 DIAGNOSIS — Z1211 Encounter for screening for malignant neoplasm of colon: Secondary | ICD-10-CM

## 2015-09-08 DIAGNOSIS — E785 Hyperlipidemia, unspecified: Secondary | ICD-10-CM

## 2015-09-08 DIAGNOSIS — Z0000 Encounter for general adult medical examination without abnormal findings: Secondary | ICD-10-CM

## 2015-09-08 NOTE — Patient Instructions (Addendum)
Your Triglycerides are very high and will need to be treated. Start taking Fish Oil 3000 mg every day to help lower these levels.   It is important that you improve your diet. Please limit carbohydrates in the form of white bread, rice, pasta, processed carbohydrates, sugary drinks, etc. Increase your consumption of fresh fruits and vegetables.  You need to consume about 64 ounces of water daily.  Start exercising. You should be getting 1 hour of moderate intensity exercise 5 days weekly.  You will be contacted regarding your Colonoscopy.  Please let us know if you have not heard back within one week.   I highly recommend we provide you with a pneumonia vaccination.   Schedule a lab only appointment in 3 months for re-check of the A1C and your cholesterol.  Schedule a follow up appointment in 6 months.  It was a pleasure to see you today!

## 2015-09-08 NOTE — Assessment & Plan Note (Signed)
Stable today. Continue current regimen. 

## 2015-09-08 NOTE — Progress Notes (Signed)
Pre visit review using our clinic review tool, if applicable. No additional management support is needed unless otherwise documented below in the visit note. 

## 2015-09-08 NOTE — Assessment & Plan Note (Signed)
Currently managed on atorvastatin 40 mg. LDL and TC at goal. Trigs over 400 which is uncontrolled and places him at high risk. Long time spent discussing importance of weight loss. He verbalized understanding. Start Fish Oil 3000 mg daily. Recheck levels in 3 months.

## 2015-09-08 NOTE — Assessment & Plan Note (Signed)
Due for Tdap, Flu, Pneumonia. He declines all. Discussed importance of all. He will think about this. Referral placed to GI for colonoscopy as he will be 50 this month. Labs with high Trigs. Improvement in A1C. Exam unremarkable.  Discussed the importance of a healthy diet and regular exercise in order for weight loss and to reduce risk of other medical diseases.  Follow up in 1 year for repeat physical.

## 2015-09-08 NOTE — Progress Notes (Signed)
Subjective:    Patient ID: Dennis Curry, male    DOB: 04/14/1966, 50 y.o.   MRN: [id number]  HPI  Dennis Curry is a 50 year old male who presents today for complete physical.  Immunizations: -Tetanus: Completed October 2003, Declines -Influenza: Declines  -Pneumonia: Declines   Diet: Endorses a fair diet.  Breakfast: Egg and toast Lunch: Salads Dinner: Home cooked meals (pasta, chicken, steaks, vegetables, cut back on breads and potatoes). Eats out of Friday nights. Snacks: Occasionally, fruit Desserts: Occasionally Beverages: Water, coke zero  Exercise: He is not currently exercising. Eye exam: Completed 2 days ago. Dental exam: Completed in January 2017. Colonoscopy: Due later this month as he will be 50.     Review of Systems  Constitutional: Negative for unexpected weight change.  HENT: Negative for rhinorrhea.   Respiratory: Negative for cough and shortness of breath.   Cardiovascular: Negative for chest pain.  Gastrointestinal: Negative for diarrhea and constipation.  Genitourinary: Negative for difficulty urinating.  Musculoskeletal: Negative for myalgias and arthralgias.  Skin: Negative for rash.  Allergic/Immunologic: Negative for environmental allergies.  Neurological: Negative for dizziness, numbness and headaches.  Psychiatric/Behavioral:       Denies concerns for anxiety or depression       Past Medical History  Diagnosis Date  . Diabetes mellitus without complication (HCC)   . Hypertension   . Aortic insufficiency     a. TTE 11/30/14: EF 25-30%, bicupid valve cannot be excluded, mod-severe aortic regurge, aortic root dimension 52 mm, mild MR, mildly dilated LA/RA, PASP 60; TEE 12/01/14: EF 25-30%, diffuse HK, aortic valve trileaflet, mild to mod Ao regurge, ascending aortic grossly measured at 4.5 cm (not well visualized),   . Nonischemic dilated cardiomyopathy (HCC)     a. cath 12/02/14: right dom coronary system w/ mild lumenal irregs, o/w no sig  CAD, sev diffuse HK EF 25%, no sig MR or AS, med Rx recommeded   . Chronic systolic CHF (congestive heart failure) (HCC)     a. TTE 11/30/14: EF 25-30%, bicupid valve cannot be excluded, mod-severe aortic regurge, aortic root dimension 52 mm, mild MR, mildly dilated LA/RA, PASP 60; TEE 12/01/14: EF 25-30%, diffuse HK, aortic valve trileaflet, mild to mod Ao regurge, ascending aortic grossly measured at 4.5 cm (not well visualized),   . Ascending aorta dilatation (HCC)     a. 52 mm by TTE 11/2014; b. 45 mm by TEE 11/2014 (not well visualized)    Social History   Social History  . Marital Status: Married    Spouse Name: N/A  . Number of Children: N/A  . Years of Education: N/A   Occupational History  . Not on file.   Social History Main Topics  . Smoking status: Never Smoker   . Smokeless tobacco: Not on file  . Alcohol Use: No  . Drug Use: No  . Sexual Activity: Not on file   Other Topics Concern  . Not on file   Social History Narrative   Married.   Works as a Engineer, structural at [employer].   No children.   Enjoys playing guitar, traveling, spending time with his wife.    Past Surgical History  Procedure Laterality Date  . Other surgical history      none  . Cardiac catheterization N/A 12/02/2014    Procedure: Left Heart Cath and Coronary Angiography;  Surgeon: Dennis Iba, MD;  Location: [facility];  Service: Cardiovascular;  Laterality: N/A;  . Coronary angioplasty  Family History  Problem Relation Age of Onset  . Coronary artery disease Mother   . Hypertension Father   . Diabetes Mellitus II Paternal Uncle     No Known Allergies  Current Outpatient Prescriptions on File Prior to Visit  Medication Sig Dispense Refill  . aspirin 81 MG tablet Take 81 mg by mouth daily.    Marland Kitchen atorvastatin (LIPITOR) 40 MG tablet Take 1 tablet (40 mg total) by mouth at bedtime. 90 tablet 3  . carvedilol (COREG) 6.25 MG tablet Take 1 tablet (6.25 mg total) by mouth 2  (two) times daily with a meal. 180 tablet 3  . furosemide (LASIX) 40 MG tablet Take 1 tablet (40 mg total) by mouth 2 (two) times daily. 180 tablet 3  . glipiZIDE (GLUCOTROL XL) 5 MG 24 hr tablet Take 1 tablet (5 mg total) by mouth daily with breakfast. 30 tablet 5  . losartan (COZAAR) 100 MG tablet Take 1 tablet (100 mg total) by mouth daily. 90 tablet 3  . metFORMIN (GLUCOPHAGE) 1000 MG tablet Take 1 tablet (1,000 mg total) by mouth 2 (two) times daily with a meal. 60 tablet 5  . spironolactone (ALDACTONE) 25 MG tablet Take 1 tablet (25 mg total) by mouth 2 (two) times daily. 180 tablet 3  . tadalafil (CIALIS) 5 MG tablet Take 1 tablet (5 mg total) by mouth daily as needed for erectile dysfunction. 10 tablet 0   Current Facility-Administered Medications on File Prior to Visit  Medication Dose Route Frequency Provider Last Rate Last Dose  . acetaminophen (TYLENOL) tablet 650 mg  650 mg Oral Q4H PRN Dennis Iba, MD        BP 136/80 mmHg  Pulse 78  Temp(Src) 98.3 F (36.8 C) (Oral)  Ht 6' (1.829 m)  Wt 235 lb 12.8 oz (106.958 kg)  BMI 31.97 kg/m2  SpO2 97%    Objective:   Physical Exam  Constitutional: He is oriented to person, place, and time. He appears well-nourished.  HENT:  Right Ear: Tympanic membrane and ear canal normal.  Left Ear: Tympanic membrane and ear canal normal.  Nose: Nose normal. Right sinus exhibits no maxillary sinus tenderness and no frontal sinus tenderness. Left sinus exhibits no maxillary sinus tenderness and no frontal sinus tenderness.  Mouth/Throat: Oropharynx is clear and moist.  Eyes: Conjunctivae and EOM are normal. Pupils are equal, round, and reactive to light.  Neck: Neck supple. No thyromegaly present.  Cardiovascular: Normal rate, regular rhythm and normal heart sounds.   Pulmonary/Chest: Effort normal and breath sounds normal. He has no wheezes. He has no rales.  Abdominal: Soft. Bowel sounds are normal. There is no tenderness.    Musculoskeletal: Normal range of motion.  Neurological: He is alert and oriented to person, place, and time. He has normal reflexes. No cranial nerve deficit.  Skin: Skin is warm and dry.  Psychiatric: He has a normal mood and affect.          Assessment & Plan:

## 2015-09-08 NOTE — Assessment & Plan Note (Addendum)
Improvement from 9.1 to 7.0. Would like to see him below 7.  Spent a long time discussing importance of weight loss through healthy diet and exercise. He is motivated.  Microalbumin on file, declines pneumonia vaccination, managed on ARB.  Will recheck levels in 3 months. Continue current medications.

## 2015-09-08 NOTE — Assessment & Plan Note (Signed)
Potassium WNL

## 2015-09-28 ENCOUNTER — Ambulatory Visit (INDEPENDENT_AMBULATORY_CARE_PROVIDER_SITE_OTHER): Payer: Managed Care, Other (non HMO) | Admitting: Primary Care

## 2015-09-28 ENCOUNTER — Encounter: Payer: Self-pay | Admitting: Primary Care

## 2015-09-28 VITALS — BP 138/84 | HR 84 | Temp 99.1°F | Ht 72.0 in | Wt 235.8 lb

## 2015-09-28 DIAGNOSIS — J029 Acute pharyngitis, unspecified: Secondary | ICD-10-CM

## 2015-09-28 NOTE — Progress Notes (Signed)
Pre visit review using our clinic review tool, if applicable. No additional management support is needed unless otherwise documented below in the visit note. 

## 2015-09-28 NOTE — Progress Notes (Signed)
Subjective:    Patient ID: Dennis Curry, male    DOB: Jun 06, 1966, 50 y.o.   MRN: [id number]  HPI  Dennis Curry is a 50 year old male who presents today with a chief complaint of cough. He also reports sore throat and fevers. His symptoms began yesterday morning. Last night he came home and experienced chills, body aches, and had a fever ranging from 99.0 to 100.1. He's not taken anything OTC for his symptoms. Denies sick contacts. His cough is non productive. He did not receive a flu vaccination last season.  Review of Systems  Constitutional: Positive for fever, chills and fatigue.  HENT: Positive for congestion and sore throat. Negative for ear pain and sinus pressure.   Respiratory: Positive for cough. Negative for shortness of breath.   Cardiovascular: Negative for chest pain.  Gastrointestinal: Positive for nausea. Negative for vomiting.       Past Medical History  Diagnosis Date  . Diabetes mellitus without complication (HCC)   . Hypertension   . Aortic insufficiency     a. TTE 11/30/14: EF 25-30%, bicupid valve cannot be excluded, mod-severe aortic regurge, aortic root dimension 52 mm, mild MR, mildly dilated LA/RA, PASP 60; TEE 12/01/14: EF 25-30%, diffuse HK, aortic valve trileaflet, mild to mod Ao regurge, ascending aortic grossly measured at 4.5 cm (not well visualized),   . Nonischemic dilated cardiomyopathy (HCC)     a. cath 12/02/14: right dom coronary system w/ mild lumenal irregs, o/w no sig CAD, sev diffuse HK EF 25%, no sig MR or AS, med Rx recommeded   . Chronic systolic CHF (congestive heart failure) (HCC)     a. TTE 11/30/14: EF 25-30%, bicupid valve cannot be excluded, mod-severe aortic regurge, aortic root dimension 52 mm, mild MR, mildly dilated LA/RA, PASP 60; TEE 12/01/14: EF 25-30%, diffuse HK, aortic valve trileaflet, mild to mod Ao regurge, ascending aortic grossly measured at 4.5 cm (not well visualized),   . Ascending aorta dilatation (HCC)     a. 52 mm by TTE  11/2014; b. 45 mm by TEE 11/2014 (not well visualized)    Social History   Social History  . Marital Status: Married    Spouse Name: N/A  . Number of Children: N/A  . Years of Education: N/A   Occupational History  . Not on file.   Social History Main Topics  . Smoking status: Never Smoker   . Smokeless tobacco: Not on file  . Alcohol Use: No  . Drug Use: No  . Sexual Activity: Not on file   Other Topics Concern  . Not on file   Social History Narrative   Married.   Works as a Engineer, structural at [employer].   No children.   Enjoys playing guitar, traveling, spending time with his wife.    Past Surgical History  Procedure Laterality Date  . Other surgical history      none  . Cardiac catheterization N/A 12/02/2014    Procedure: Left Heart Cath and Coronary Angiography;  Surgeon: Antonieta Iba, MD;  Location: [facility] INVASIVE CV LAB;  Service: Cardiovascular;  Laterality: N/A;  . Coronary angioplasty      Family History  Problem Relation Age of Onset  . Coronary artery disease Mother   . Hypertension Father   . Diabetes Mellitus II Paternal Uncle     No Known Allergies  Current Outpatient Prescriptions on File Prior to Visit  Medication Sig Dispense Refill  . aspirin 81 MG tablet Take 81  mg by mouth daily.    Marland Kitchen. atorvastatin (LIPITOR) 40 MG tablet Take 1 tablet (40 mg total) by mouth at bedtime. 90 tablet 3  . carvedilol (COREG) 6.25 MG tablet Take 1 tablet (6.25 mg total) by mouth 2 (two) times daily with a meal. 180 tablet 3  . furosemide (LASIX) 40 MG tablet Take 1 tablet (40 mg total) by mouth 2 (two) times daily. 180 tablet 3  . glipiZIDE (GLUCOTROL XL) 5 MG 24 hr tablet Take 1 tablet (5 mg total) by mouth daily with breakfast. 30 tablet 5  . losartan (COZAAR) 100 MG tablet Take 1 tablet (100 mg total) by mouth daily. 90 tablet 3  . metFORMIN (GLUCOPHAGE) 1000 MG tablet Take 1 tablet (1,000 mg total) by mouth 2 (two) times daily with a meal. 60 tablet 5  .  spironolactone (ALDACTONE) 25 MG tablet Take 1 tablet (25 mg total) by mouth 2 (two) times daily. 180 tablet 3  . tadalafil (CIALIS) 5 MG tablet Take 1 tablet (5 mg total) by mouth daily as needed for erectile dysfunction. 10 tablet 0   Current Facility-Administered Medications on File Prior to Visit  Medication Dose Route Frequency Provider Last Rate Last Dose  . acetaminophen (TYLENOL) tablet 650 mg  650 mg Oral Q4H PRN Antonieta Ibaimothy J Gollan, MD        BP 138/84 mmHg  Pulse 84  Temp(Src) 99.1 F (37.3 C) (Oral)  Ht 6' (1.829 m)  Wt 235 lb 12.8 oz (106.958 kg)  BMI 31.97 kg/m2  SpO2 95%    Objective:   Physical Exam  Constitutional: He appears well-nourished.  HENT:  Right Ear: Tympanic membrane and ear canal normal.  Left Ear: Tympanic membrane and ear canal normal.  Nose: Nose normal.  Mouth/Throat: Oropharynx is clear and moist.  Eyes: Conjunctivae are normal.  Neck: Neck supple.  Cardiovascular: Normal rate and regular rhythm.   Pulmonary/Chest: Effort normal and breath sounds normal. He has no wheezes. He has no rales.  Lymphadenopathy:    He has no cervical adenopathy.  Skin: Skin is warm and dry.          Assessment & Plan:  Sore throat:  Also with low grade fevers, non productive cough, body aches, chills. Symptoms present since yesterday. Exam mostly unremarkable, dose appear fatigued. Lungs clear, throat without evidence of erythema or exudate. Does not appear to be strep. Rapid Flu: Negative Suspect viral involvement at this point and will treat with supportive measures. Robitussin or Delsym PRN cough. Tylenol for fevers and body aches. Flonase. Return precautions provided.

## 2015-09-28 NOTE — Patient Instructions (Signed)
Your flu test was negative. Your symptoms are likely related to a viral infection that will pass on its own.  Sore throat, body aches, fevers: Tylenol 500 to 1000 mg three times daily as needed.  Cough: Robitussin DM or Delsym DM. This may be purchased over the counter.  Nasal Congestion: Try using Flonase (fluticasone) nasal spray. Instill 2 sprays in each nostril once daily.   Please notify me if you develop persistent fevers of 101, start coughing up green mucous, notice increased fatigue or weakness, or feel worse after 1 week of onset of symptoms.   Increase consumption of water intake and rest.  It was a pleasure to see you today!  Upper Respiratory Infection, Adult Most upper respiratory infections (URIs) are a viral infection of the air passages leading to the lungs. A URI affects the nose, throat, and upper air passages. The most common type of URI is nasopharyngitis and is typically referred to as "the common cold." URIs run their course and usually go away on their own. Most of the time, a URI does not require medical attention, but sometimes a bacterial infection in the upper airways can follow a viral infection. This is called a secondary infection. Sinus and middle ear infections are common types of secondary upper respiratory infections. Bacterial pneumonia can also complicate a URI. A URI can worsen asthma and chronic obstructive pulmonary disease (COPD). Sometimes, these complications can require emergency medical care and may be life threatening.  CAUSES Almost all URIs are caused by viruses. A virus is a type of germ and can spread from one person to another.  RISKS FACTORS You may be at risk for a URI if:   You smoke.   You have chronic heart or lung disease.  You have a weakened defense (immune) system.   You are very young or very old.   You have nasal allergies or asthma.  You work in crowded or poorly ventilated areas.  You work in health care facilities  or schools. SIGNS AND SYMPTOMS  Symptoms typically develop 2-3 days after you come in contact with a cold virus. Most viral URIs last 7-10 days. However, viral URIs from the influenza virus (flu virus) can last 14-18 days and are typically more severe. Symptoms may include:   Runny or stuffy (congested) nose.   Sneezing.   Cough.   Sore throat.   Headache.   Fatigue.   Fever.   Loss of appetite.   Pain in your forehead, behind your eyes, and over your cheekbones (sinus pain).  Muscle aches.  DIAGNOSIS  Your health care provider may diagnose a URI by:  Physical exam.  Tests to check that your symptoms are not due to another condition such as:  Strep throat.  Sinusitis.  Pneumonia.  Asthma. TREATMENT  A URI goes away on its own with time. It cannot be cured with medicines, but medicines may be prescribed or recommended to relieve symptoms. Medicines may help:  Reduce your fever.  Reduce your cough.  Relieve nasal congestion. HOME CARE INSTRUCTIONS   Take medicines only as directed by your health care provider.   Gargle warm saltwater or take cough drops to comfort your throat as directed by your health care provider.  Use a warm mist humidifier or inhale steam from a shower to increase air moisture. This may make it easier to breathe.  Drink enough fluid to keep your urine clear or pale yellow.   Eat soups and other clear broths and maintain  good nutrition.   Rest as needed.   Return to work when your temperature has returned to normal or as your health care provider advises. You may need to stay home longer to avoid infecting others. You can also use a face mask and careful hand washing to prevent spread of the virus.  Increase the usage of your inhaler if you have asthma.   Do not use any tobacco products, including cigarettes, chewing tobacco, or electronic cigarettes. If you need help quitting, ask your health care provider. PREVENTION    The best way to protect yourself from getting a cold is to practice good hygiene.   Avoid oral or hand contact with people with cold symptoms.   Wash your hands often if contact occurs.  There is no clear evidence that vitamin C, vitamin E, echinacea, or exercise reduces the chance of developing a cold. However, it is always recommended to get plenty of rest, exercise, and practice good nutrition.  SEEK MEDICAL CARE IF:   You are getting worse rather than better.   Your symptoms are not controlled by medicine.   You have chills.  You have worsening shortness of breath.  You have brown or red mucus.  You have yellow or brown nasal discharge.  You have pain in your face, especially when you bend forward.  You have a fever.  You have swollen neck glands.  You have pain while swallowing.  You have white areas in the back of your throat. SEEK IMMEDIATE MEDICAL CARE IF:   You have severe or persistent:  Headache.  Ear pain.  Sinus pain.  Chest pain.  You have chronic lung disease and any of the following:  Wheezing.  Prolonged cough.  Coughing up blood.  A change in your usual mucus.  You have a stiff neck.  You have changes in your:  Vision.  Hearing.  Thinking.  Mood. MAKE SURE YOU:   Understand these instructions.  Will watch your condition.  Will get help right away if you are not doing well or get worse.   This information is not intended to replace advice given to you by your health care provider. Make sure you discuss any questions you have with your health care provider.   Document Released: 12/20/2000 Document Revised: 11/10/2014 Document Reviewed: 10/01/2013 Elsevier Interactive Patient Education [employer]2016 Elsevier Inc.

## 2015-10-01 ENCOUNTER — Encounter: Payer: Self-pay | Admitting: Primary Care

## 2015-10-01 NOTE — Telephone Encounter (Signed)
Please see Mychart message.

## 2015-10-02 ENCOUNTER — Ambulatory Visit
Admission: EM | Admit: 2015-10-02 | Discharge: 2015-10-02 | Disposition: A | Discharge status: Home / Self Care | Payer: Managed Care, Other (non HMO) | Attending: Family Medicine | Admitting: Family Medicine

## 2015-10-02 DIAGNOSIS — J069 Acute upper respiratory infection, unspecified: Secondary | ICD-10-CM

## 2015-10-02 LAB — RAPID INFLUENZA A&B ANTIGENS: Influenza A (ARMC): NEGATIVE

## 2015-10-02 LAB — RAPID STREP SCREEN (MED CTR MEBANE ONLY): Streptococcus, Group A Screen (Direct): NEGATIVE

## 2015-10-02 LAB — RAPID INFLUENZA A&B ANTIGENS (ARMC ONLY): INFLUENZA B (ARMC): NEGATIVE

## 2015-10-02 MED ORDER — AMOXICILLIN-POT CLAVULANATE 875-125 MG PO TABS: 1.0000 | ORAL_TABLET | Freq: Two times a day (BID) | ORAL | Status: DC

## 2015-10-02 NOTE — ED Provider Notes (Signed)
CSN: [id number]     Arrival date & time 10/02/15  0920 History   First MD Initiated Contact with Patient 10/02/15 1106     Chief Complaint  Patient presents with  . URI   (Consider location/radiation/quality/duration/timing/severity/associated sxs/prior Treatment) HPI  50 year old male with a complicated past medical history presents with complaints of fever, nasal congestion, and cough.  Patient states that he's not felt well since Monday. He's had frequent fever, MAXIMUM TEMPERATURE 101. He also reports these have body aches, sore throat, and nasal congestion and cough. He was recently seen by his primary for this. His symptoms have continued to persist despite conservative/supportive care. His fever broke as of last night. No known exacerbating or relieving factors. No reports of shortness of breath. No other complaints at this time.  Past Medical History  Diagnosis Date  . Diabetes mellitus without complication (HCC)   . Hypertension   . Aortic insufficiency     a. TTE 11/30/14: EF 25-30%, bicupid valve cannot be excluded, mod-severe aortic regurge, aortic root dimension 52 mm, mild MR, mildly dilated LA/RA, PASP 60; TEE 12/01/14: EF 25-30%, diffuse HK, aortic valve trileaflet, mild to mod Ao regurge, ascending aortic grossly measured at 4.5 cm (not well visualized),   . Nonischemic dilated cardiomyopathy (HCC)     a. cath 12/02/14: right dom coronary system w/ mild lumenal irregs, o/w no sig CAD, sev diffuse HK EF 25%, no sig MR or AS, med Rx recommeded   . Chronic systolic CHF (congestive heart failure) (HCC)     a. TTE 11/30/14: EF 25-30%, bicupid valve cannot be excluded, mod-severe aortic regurge, aortic root dimension 52 mm, mild MR, mildly dilated LA/RA, PASP 60; TEE 12/01/14: EF 25-30%, diffuse HK, aortic valve trileaflet, mild to mod Ao regurge, ascending aortic grossly measured at 4.5 cm (not well visualized),   . Ascending aorta dilatation (HCC)     a. 52 mm by TTE 11/2014; b. 45 mm  by TEE 11/2014 (not well visualized)   Past Surgical History  Procedure Laterality Date  . Other surgical history      none  . Cardiac catheterization N/A 12/02/2014    Procedure: Left Heart Cath and Coronary Angiography;  Surgeon: Antonieta Iba, MD;  Location: [facility] INVASIVE CV LAB;  Service: Cardiovascular;  Laterality: N/A;  . Coronary angioplasty     Family History  Problem Relation Age of Onset  . Coronary artery disease Mother   . Hypertension Father   . Diabetes Mellitus II Paternal Uncle    Social History  Substance Use Topics  . Smoking status: Never Smoker   . Smokeless tobacco: None  . Alcohol Use: No    Review of Systems  Constitutional: Positive for fever.  HENT: Positive for congestion.   Respiratory: Positive for cough.   Musculoskeletal: Positive for myalgias.  All other systems reviewed and are negative.   Allergies  Review of patient's allergies indicates no known allergies.  Home Medications   Prior to Admission medications   Medication Sig Start Date End Date Taking? Authorizing Provider  amoxicillin-clavulanate (AUGMENTIN) 875-125 MG tablet Take 1 tablet by mouth every 12 (twelve) hours. 10/02/15   Tommie Sams, DO  aspirin 81 MG tablet Take 81 mg by mouth daily.    Historical Provider, MD  atorvastatin (LIPITOR) 40 MG tablet Take 1 tablet (40 mg total) by mouth at bedtime. 03/26/15   Antonieta Iba, MD  carvedilol (COREG) 6.25 MG tablet Take 1 tablet (6.25 mg total) by mouth  2 (two) times daily with a meal. 03/26/15   Antonieta Ibaimothy J Gollan, MD  furosemide (LASIX) 40 MG tablet Take 1 tablet (40 mg total) by mouth 2 (two) times daily. 09/01/15   Antonieta Ibaimothy J Gollan, MD  glipiZIDE (GLUCOTROL XL) 5 MG 24 hr tablet Take 1 tablet (5 mg total) by mouth daily with breakfast. 06/08/15   Doreene NestKatherine K Clark, NP  losartan (COZAAR) 100 MG tablet Take 1 tablet (100 mg total) by mouth daily. 03/26/15   Antonieta Ibaimothy J Gollan, MD  metFORMIN (GLUCOPHAGE) 1000 MG tablet Take 1 tablet  (1,000 mg total) by mouth 2 (two) times daily with a meal. 06/08/15   Doreene NestKatherine K Clark, NP  spironolactone (ALDACTONE) 25 MG tablet Take 1 tablet (25 mg total) by mouth 2 (two) times daily. 03/26/15   Antonieta Ibaimothy J Gollan, MD  tadalafil (CIALIS) 5 MG tablet Take 1 tablet (5 mg total) by mouth daily as needed for erectile dysfunction. 09/01/15   Doreene NestKatherine K Clark, NP   Meds Ordered and Administered this Visit  Medications - No data to display  BP 121/66 mmHg  Pulse 82  Temp(Src) 98.7 F (37.1 C) (Oral)  Resp 18  Ht 6' (1.829 m)  Wt 230 lb (104.327 kg)  BMI 31.19 kg/m2  SpO2 99% No data found.   Physical Exam  Constitutional: He is oriented to person, place, and time. He appears well-developed. No distress.  HENT:  Head: Normocephalic and atraumatic.  Mouth/Throat: Oropharynx is clear and moist.  Normal TMs bilaterally.  Eyes: Conjunctivae are normal. No scleral icterus.  Neck: Neck supple.  Cardiovascular: Normal rate and regular rhythm.   No murmur heard. Pulmonary/Chest: Effort normal and breath sounds normal. No respiratory distress. He has no wheezes. He has no rales.  Abdominal: Soft. He exhibits no distension. There is no tenderness.  Musculoskeletal: Normal range of motion.  Neurological: He is alert and oriented to person, place, and time.  Skin: Skin is warm and dry.  Psychiatric: He has a normal mood and affect.  Vitals reviewed.   ED Course  Procedures (including critical care time)  Labs Review Labs Reviewed  RAPID INFLUENZA A&B ANTIGENS ([facility] ONLY)  [facility])  CULTURE, GROUP A STREP [facility])   Imaging Review No results found.  MDM   1. URI (upper respiratory infection)    [age] year old male presents to urgent care today. His history and exam are consistent with a viral respiratory infection. However, given the duration of his illness as well as systemic symptoms in addition to his comorbidities I gave him a prescription for Augmentin  to be filled if he fails to improve or worsens.     Tommie SamsJayce G Shaterria Sager, DO 10/02/15 1132

## 2015-10-02 NOTE — Discharge Instructions (Signed)
Take the antibiotic if you fail to improve.  Upper Respiratory Infection, Adult Most upper respiratory infections (URIs) are a viral infection of the air passages leading to the lungs. A URI affects the nose, throat, and upper air passages. The most common type of URI is nasopharyngitis and is typically referred to as "the common cold." URIs run their course and usually go away on their own. Most of the time, a URI does not require medical attention, but sometimes a bacterial infection in the upper airways can follow a viral infection. This is called a secondary infection. Sinus and middle ear infections are common types of secondary upper respiratory infections. Bacterial pneumonia can also complicate a URI. A URI can worsen asthma and chronic obstructive pulmonary disease (COPD). Sometimes, these complications can require emergency medical care and may be life threatening.  CAUSES Almost all URIs are caused by viruses. A virus is a type of germ and can spread from one person to another.  RISKS FACTORS You may be at risk for a URI if:   You smoke.   You have chronic heart or lung disease.  You have a weakened defense (immune) system.   You are very young or very old.   You have nasal allergies or asthma.  You work in crowded or poorly ventilated areas.  You work in health care facilities or schools. SIGNS AND SYMPTOMS  Symptoms typically develop 2-3 days after you come in contact with a cold virus. Most viral URIs last 7-10 days. However, viral URIs from the influenza virus (flu virus) can last 14-18 days and are typically more severe. Symptoms may include:   Runny or stuffy (congested) nose.   Sneezing.   Cough.   Sore throat.   Headache.   Fatigue.   Fever.   Loss of appetite.   Pain in your forehead, behind your eyes, and over your cheekbones (sinus pain).  Muscle aches.  DIAGNOSIS  Your health care provider may diagnose a URI by:  Physical  exam.  Tests to check that your symptoms are not due to another condition such as:  Strep throat.  Sinusitis.  Pneumonia.  Asthma. TREATMENT  A URI goes away on its own with time. It cannot be cured with medicines, but medicines may be prescribed or recommended to relieve symptoms. Medicines may help:  Reduce your fever.  Reduce your cough.  Relieve nasal congestion. HOME CARE INSTRUCTIONS   Take medicines only as directed by your health care provider.   Gargle warm saltwater or take cough drops to comfort your throat as directed by your health care provider.  Use a warm mist humidifier or inhale steam from a shower to increase air moisture. This may make it easier to breathe.  Drink enough fluid to keep your urine clear or pale yellow.   Eat soups and other clear broths and maintain good nutrition.   Rest as needed.   Return to work when your temperature has returned to normal or as your health care provider advises. You may need to stay home longer to avoid infecting others. You can also use a face mask and careful hand washing to prevent spread of the virus.  Increase the usage of your inhaler if you have asthma.   Do not use any tobacco products, including cigarettes, chewing tobacco, or electronic cigarettes. If you need help quitting, ask your health care provider. PREVENTION  The best way to protect yourself from getting a cold is to practice good hygiene.  Avoid oral or hand contact with people with cold symptoms.   Wash your hands often if contact occurs.  There is no clear evidence that vitamin C, vitamin E, echinacea, or exercise reduces the chance of developing a cold. However, it is always recommended to get plenty of rest, exercise, and practice good nutrition.  SEEK MEDICAL CARE IF:   You are getting worse rather than better.   Your symptoms are not controlled by medicine.   You have chills.  You have worsening shortness of breath.  You  have brown or red mucus.  You have yellow or brown nasal discharge.  You have pain in your face, especially when you bend forward.  You have a fever.  You have swollen neck glands.  You have pain while swallowing.  You have white areas in the back of your throat. SEEK IMMEDIATE MEDICAL CARE IF:   You have severe or persistent:  Headache.  Ear pain.  Sinus pain.  Chest pain.  You have chronic lung disease and any of the following:  Wheezing.  Prolonged cough.  Coughing up blood.  A change in your usual mucus.  You have a stiff neck.  You have changes in your:  Vision.  Hearing.  Thinking.  Mood. MAKE SURE YOU:   Understand these instructions.  Will watch your condition.  Will get help right away if you are not doing well or get worse.   This information is not intended to replace advice given to you by your health care provider. Make sure you discuss any questions you have with your health care provider.   Document Released: 12/20/2000 Document Revised: 11/10/2014 Document Reviewed: 10/01/2013 Elsevier Interactive Patient Education [employer] Elsevier Inc.

## 2015-10-02 NOTE — ED Notes (Signed)
Patient c/o fever, congestion, cough, and sore throat which all started this past Monday night.

## 2015-10-04 LAB — CULTURE, GROUP A STREP (THRC)

## 2015-10-15 ENCOUNTER — Other Ambulatory Visit: Payer: Self-pay | Admitting: Primary Care

## 2015-10-15 DIAGNOSIS — E1165 Type 2 diabetes mellitus with hyperglycemia: Principal | ICD-10-CM

## 2015-10-15 DIAGNOSIS — IMO0001 Reserved for inherently not codable concepts without codable children: Secondary | ICD-10-CM

## 2015-10-15 MED ORDER — METFORMIN HCL 1000 MG PO TABS: 1000.0000 mg | ORAL_TABLET | Freq: Two times a day (BID) | ORAL | Status: DC

## 2015-10-15 MED ORDER — GLIPIZIDE ER 5 MG PO TB24: 5.0000 mg | ORAL_TABLET | Freq: Every day | ORAL | Status: DC

## 2015-10-15 NOTE — Telephone Encounter (Signed)
Received request to change prescriptions to 90 days supply. Last prescribed on 06/08/2015. Last seen on 09/28/2015. CPE on 03/23/2016.  Metformin 1,000 mg tablet Glipizide ER 5 mg tablet  Prescriptions has been sent to pharmacy.

## 2015-11-01 ENCOUNTER — Other Ambulatory Visit: Payer: Self-pay | Admitting: Primary Care

## 2015-12-03 ENCOUNTER — Other Ambulatory Visit: Payer: Self-pay | Admitting: Primary Care

## 2015-12-03 DIAGNOSIS — E119 Type 2 diabetes mellitus without complications: Secondary | ICD-10-CM

## 2015-12-03 DIAGNOSIS — E785 Hyperlipidemia, unspecified: Secondary | ICD-10-CM

## 2015-12-09 ENCOUNTER — Other Ambulatory Visit (INDEPENDENT_AMBULATORY_CARE_PROVIDER_SITE_OTHER): Payer: Managed Care, Other (non HMO)

## 2015-12-09 DIAGNOSIS — E119 Type 2 diabetes mellitus without complications: Secondary | ICD-10-CM | POA: Diagnosis not present

## 2015-12-09 DIAGNOSIS — E785 Hyperlipidemia, unspecified: Secondary | ICD-10-CM | POA: Diagnosis not present

## 2015-12-09 LAB — LIPID PANEL
CHOL/HDL RATIO: 5
CHOLESTEROL: 127 mg/dL (ref 0–200)
HDL: 25.9 mg/dL — ABNORMAL LOW (ref >39.00)
NonHDL: 101.35
Triglycerides: 394 mg/dL — ABNORMAL HIGH (ref 0.0–149.0)
VLDL: 78.8 mg/dL — ABNORMAL HIGH (ref 0.0–40.0)

## 2015-12-09 LAB — HEMOGLOBIN A1C: Hgb A1c MFr Bld: 6.9 % — ABNORMAL HIGH (ref 4.6–6.5)

## 2015-12-09 LAB — LDL CHOLESTEROL, DIRECT: Direct LDL: 54 mg/dL

## 2015-12-10 ENCOUNTER — Encounter: Payer: Self-pay | Admitting: *Deleted

## 2015-12-13 ENCOUNTER — Encounter: Payer: Self-pay | Admitting: Internal Medicine

## 2016-01-06 ENCOUNTER — Other Ambulatory Visit: Payer: Self-pay | Admitting: Primary Care

## 2016-02-11 ENCOUNTER — Encounter: Payer: Self-pay | Admitting: Internal Medicine

## 2016-02-11 ENCOUNTER — Ambulatory Visit (AMBULATORY_SURGERY_CENTER): Payer: Self-pay

## 2016-02-11 VITALS — Ht 72.0 in | Wt 234.0 lb

## 2016-02-11 DIAGNOSIS — Z1211 Encounter for screening for malignant neoplasm of colon: Secondary | ICD-10-CM

## 2016-02-11 NOTE — Progress Notes (Signed)
No egg or soy allergy.  No previous complications from anesthesia. No home O2. No diet meds. 

## 2016-02-23 DIAGNOSIS — I5022 Chronic systolic (congestive) heart failure: Secondary | ICD-10-CM | POA: Insufficient documentation

## 2016-02-25 ENCOUNTER — Ambulatory Visit (AMBULATORY_SURGERY_CENTER): Payer: Managed Care, Other (non HMO) | Admitting: Internal Medicine

## 2016-02-25 ENCOUNTER — Encounter: Payer: Self-pay | Admitting: Internal Medicine

## 2016-02-25 VITALS — BP 105/61 | HR 62 | Temp 98.4°F | Resp 15 | Ht 72.0 in | Wt 234.0 lb

## 2016-02-25 DIAGNOSIS — Z1211 Encounter for screening for malignant neoplasm of colon: Secondary | ICD-10-CM | POA: Diagnosis present

## 2016-02-25 MED ORDER — SODIUM CHLORIDE 0.9 % IV SOLN: 500.0000 mL | INTRAVENOUS | Status: DC

## 2016-02-25 NOTE — Patient Instructions (Signed)
Discharge instructions given. Handout on diverticulosis. Resume previous medications. YOU HAD AN ENDOSCOPIC PROCEDURE TODAY AT THE [facility]:   Refer to the procedure report that was given to you for any specific questions about what was found during the examination.  If the procedure report does not answer your questions, please call your gastroenterologist to clarify.  If you requested that your care partner not be given the details of your procedure findings, then the procedure report has been included in a sealed envelope for you to review at your convenience later.  YOU SHOULD EXPECT: Some feelings of bloating in the abdomen. Passage of more gas than usual.  Walking can help get rid of the air that was put into your GI tract during the procedure and reduce the bloating. If you had a lower endoscopy (such as a colonoscopy or flexible sigmoidoscopy) you may notice spotting of blood in your stool or on the toilet paper. If you underwent a bowel prep for your procedure, you may not have a normal bowel movement for a few days.  Please Note:  You might notice some irritation and congestion in your nose or some drainage.  This is from the oxygen used during your procedure.  There is no need for concern and it should clear up in a day or so.  SYMPTOMS TO REPORT IMMEDIATELY:   Following lower endoscopy (colonoscopy or flexible sigmoidoscopy):  Excessive amounts of blood in the stool  Significant tenderness or worsening of abdominal pains  Swelling of the abdomen that is new, acute  Fever of 100F or higher   For urgent or emergent issues, a gastroenterologist can be reached at any hour by calling [phone].   DIET:  We do recommend a small meal at first, but then you may proceed to your regular diet.  Drink plenty of fluids but you should avoid alcoholic beverages for 24 hours.  ACTIVITY:  You should plan to take it easy for the rest of today and you should NOT DRIVE or use  heavy machinery until tomorrow (because of the sedation medicines used during the test).    FOLLOW UP: Our staff will call the number listed on your records the next business day following your procedure to check on you and address any questions or concerns that you may have regarding the information given to you following your procedure. If we do not reach you, we will leave a message.  However, if you are feeling well and you are not experiencing any problems, there is no need to return our call.  We will assume that you have returned to your regular daily activities without incident.  If any biopsies were taken you will be contacted by phone or by letter within the next 1-3 weeks.  Please call us at [phone] if you have not heard about the biopsies in 3 weeks.    SIGNATURES/CONFIDENTIALITY: You and/or your care partner have signed paperwork which will be entered into your electronic medical record.  These signatures attest to the fact that that the information above on your After Visit Summary has been reviewed and is understood.  Full responsibility of the confidentiality of this discharge information lies with you and/or your care-partner. 

## 2016-02-25 NOTE — Progress Notes (Signed)
A and O x3. Report to RN. Tolerated MAC anesthesia well. 

## 2016-02-25 NOTE — Op Note (Signed)
[facility] Patient Name: Dennis PeersWilliam Curry Procedure Date: 02/25/2016 9:36 AM MRN: [id number] Endoscopist: Iva Booparl E Gianluca Chhim , MD Age: [age] Referring MD:  Date of Birth: 06/20/1966 Gender: Male Account #: [id number] Procedure:                Colonoscopy Indications:              Screening for colorectal malignant neoplasm Medicines:                Propofol per Anesthesia, Monitored Anesthesia Care Procedure:                Pre-Anesthesia Assessment:                           - Prior to the procedure, a History and Physical                            was performed, and patient medications and                            allergies were reviewed. The patient's tolerance of                            previous anesthesia was also reviewed. The risks                            and benefits of the procedure and the sedation                            options and risks were discussed with the patient.                            All questions were answered, and informed consent                            was obtained. Prior Anticoagulants: The patient                            last took aspirin 1 day prior to the procedure. ASA                            Grade Assessment: III - A patient with severe                            systemic disease. After reviewing the risks and                            benefits, the patient was deemed in satisfactory                            condition to undergo the procedure.                           After obtaining informed consent, the colonoscope  was passed under direct vision. Throughout the                            procedure, the patient's blood pressure, pulse, and                            oxygen saturations were monitored continuously. The                            Model CF-HQ190L [id number](SN#[id number]) scope was introduced                            through the anus and advanced to the the cecum,   identified by appendiceal orifice and ileocecal                            valve. The ileocecal valve, appendiceal orifice,                            and rectum were photographed. The quality of the                            bowel preparation was good. The bowel preparation                            used was Miralax. Scope In: 9:47:05 AM Scope Out: 10:01:34 AM Scope Withdrawal Time: 0 hours 10 minutes 17 seconds  Total Procedure Duration: 0 hours 14 minutes 29 seconds  Findings:                 The perianal and digital rectal examinations were                            normal. Pertinent negatives include normal prostate                            (size, shape, and consistency).                           Many diverticula were found in the left colon.                           The exam was otherwise without abnormality on                            direct and retroflexion views. Complications:            No immediate complications. Estimated blood loss:                            None. Estimated Blood Loss:     Estimated blood loss: none. Impression:               - Severe diverticulosis in the left colon.                           -  The examination was otherwise normal on direct                            and retroflexion views.                           - No specimens collected. Recommendation:           - Repeat colonoscopy in 10 years for screening                            purposes.                           - Resume previous diet.                           - Resume aspirin today at prior dose. [Management].                           - Continue present medications. Iva Boop, MD 02/25/2016 10:08:57 AM This report has been signed electronically.

## 2016-02-28 ENCOUNTER — Telehealth: Payer: Self-pay

## 2016-02-28 NOTE — Telephone Encounter (Signed)
  Follow up Call-  Call back number 02/25/2016  Post procedure Call Back phone  # [phone]  Permission to leave phone message Yes     Patient questions:  Do you have a fever, pain , or abdominal swelling? No. Pain Score  0 *  Have you tolerated food without any problems? Yes.    Have you been able to return to your normal activities? Yes.    Do you have any questions about your discharge instructions: Diet   No. Medications  No. Follow up visit  No.  Do you have questions or concerns about your Care? No.  Actions: * If pain score is 4 or above: No action needed, pain <4.

## 2016-03-03 ENCOUNTER — Encounter: Payer: Managed Care, Other (non HMO) | Admitting: Internal Medicine

## 2016-03-13 ENCOUNTER — Other Ambulatory Visit: Payer: Self-pay | Admitting: Cardiovascular Disease

## 2016-03-13 DIAGNOSIS — E785 Hyperlipidemia, unspecified: Secondary | ICD-10-CM

## 2016-03-23 ENCOUNTER — Ambulatory Visit (INDEPENDENT_AMBULATORY_CARE_PROVIDER_SITE_OTHER): Payer: Managed Care, Other (non HMO) | Admitting: Primary Care

## 2016-03-23 ENCOUNTER — Encounter: Payer: Self-pay | Admitting: Primary Care

## 2016-03-23 ENCOUNTER — Telehealth: Payer: Self-pay | Admitting: Primary Care

## 2016-03-23 VITALS — BP 136/82 | HR 73 | Temp 98.4°F | Ht 72.0 in | Wt 234.8 lb

## 2016-03-23 DIAGNOSIS — E119 Type 2 diabetes mellitus without complications: Secondary | ICD-10-CM | POA: Diagnosis not present

## 2016-03-23 DIAGNOSIS — I10 Essential (primary) hypertension: Secondary | ICD-10-CM

## 2016-03-23 DIAGNOSIS — I5022 Chronic systolic (congestive) heart failure: Secondary | ICD-10-CM

## 2016-03-23 DIAGNOSIS — E785 Hyperlipidemia, unspecified: Secondary | ICD-10-CM | POA: Diagnosis not present

## 2016-03-23 LAB — MICROALBUMIN / CREATININE URINE RATIO
Creatinine,U: 83.1 mg/dL
MICROALB/CREAT RATIO: 0.8 mg/g (ref 0.0–30.0)

## 2016-03-23 LAB — LIPID PANEL
Cholesterol: 106 mg/dL (ref 0–200)
HDL: 26 mg/dL — ABNORMAL LOW (ref >39.00)
NONHDL: 80.33
Total CHOL/HDL Ratio: 4
Triglycerides: 341 mg/dL — ABNORMAL HIGH (ref 0.0–149.0)
VLDL: 68.2 mg/dL — ABNORMAL HIGH (ref 0.0–40.0)

## 2016-03-23 LAB — BASIC METABOLIC PANEL
BUN: 21 mg/dL (ref 6–23)
CALCIUM: 9 mg/dL (ref 8.4–10.5)
CO2: 27 mEq/L (ref 19–32)
Chloride: 101 mEq/L (ref 96–112)
Creatinine, Ser: 1.29 mg/dL (ref 0.40–1.50)
GFR: 62.54 mL/min (ref >60.00)
GLUCOSE: 160 mg/dL — AB (ref 70–99)
Potassium: 4 mEq/L (ref 3.5–5.1)
Sodium: 137 mEq/L (ref 135–145)

## 2016-03-23 LAB — HEMOGLOBIN A1C: Hgb A1c MFr Bld: 6.9 % — ABNORMAL HIGH (ref 4.6–6.5)

## 2016-03-23 LAB — LDL CHOLESTEROL, DIRECT: LDL DIRECT: 44 mg/dL

## 2016-03-23 NOTE — Progress Notes (Signed)
Subjective:    Patient ID: Dennis Curry, male    DOB: Dec 28, 1965, 50 y.o.   MRN: [id number]  HPI  Dennis Curry is a 50 year old male who presents today for follow up.  1) Type 2 Diabetes: Currently managed on Metformin 1000 mg BID and Glipizide XL 5 mg. His last A1C in June 2017 was stable at 6.9. Denies GI upset, dizziness.  2) Hyperlipidemia: Currently managed on atorvastatin 40 mg. Lipid panel in June stable with the exception of triglycerides which were above goal. He was strongly encouraged to continue statin therapy and initiate Fish Oil. Since his last visit he's taking Fish Oil twice daily. He is not exercising regularly. He is working to improve his diet. Denies myalgias.  3) Essential Hypertension: Currently managed on carvedilol 6.25 mg, Losartan 100 mg. His BP is stable in the clinic today. Denies chest pain, dizziness. He is due to see cardiology next week.   4) CHF: Currently managed on Spironolactone 25 mg BID, Lasix 40 mg BID, and carvedilol 6.25 mg. Denies lower extremity edema, shortness of breath, cough. He currently follows with CHF clinic annually, next visit in October 2017,  Review of Systems  Constitutional: Negative for fatigue.  Respiratory: Negative for shortness of breath.   Cardiovascular: Negative for chest pain, palpitations and leg swelling.  Musculoskeletal: Negative for arthralgias.  Neurological: Negative for numbness and headaches.       Past Medical History:  Diagnosis Date  . Aortic insufficiency    a. TTE 11/30/14: EF 25-30%, bicupid valve cannot be excluded, mod-severe aortic regurge, aortic root dimension 52 mm, mild MR, mildly dilated LA/RA, PASP 60; TEE 12/01/14: EF 25-30%, diffuse HK, aortic valve trileaflet, mild to mod Ao regurge, ascending aortic grossly measured at 4.5 cm (not well visualized),   . Ascending aorta dilatation (HCC)    a. 52 mm by TTE 11/2014; b. 45 mm by TEE 11/2014 (not well visualized)  . Chronic systolic CHF (congestive  heart failure) (HCC)    a. TTE 11/30/14: EF 25-30%, bicupid valve cannot be excluded, mod-severe aortic regurge, aortic root dimension 52 mm, mild MR, mildly dilated LA/RA, PASP 60; TEE 12/01/14: EF 25-30%, diffuse HK, aortic valve trileaflet, mild to mod Ao regurge, ascending aortic grossly measured at 4.5 cm (not well visualized),   . Diabetes mellitus without complication (HCC)   . Hyperlipidemia   . Hypertension   . Nonischemic dilated cardiomyopathy (HCC)    a. cath 12/02/14: right dom coronary system w/ mild lumenal irregs, o/w no sig CAD, sev diffuse HK EF 25%, no sig MR or AS, med Rx recommeded      Social History   Social History  . Marital status: Married    Spouse name: N/A  . Number of children: N/A  . Years of education: N/A   Occupational History  . Not on file.   Social History Main Topics  . Smoking status: Never Smoker  . Smokeless tobacco: Never Used  . Alcohol use No  . Drug use: No  . Sexual activity: Not on file   Other Topics Concern  . Not on file   Social History Narrative   Married.   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   No children.   Enjoys playing guitar, traveling, spending time with his wife.    Past Surgical History:  Procedure Laterality Date  . CARDIAC CATHETERIZATION N/A 12/02/2014   Procedure: Left Heart Cath and Coronary Angiography;  Surgeon: Antonieta Ibaimothy J Gollan, MD;  Location: [facility] INVASIVE CV LAB;  Service: Cardiovascular;  Laterality: N/A;  . 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 CORONARY ANGIOPLASTY    . OTHER SURGICAL HISTORY     none    Family History  Problem Relation Age of Onset  . Coronary artery disease Mother   . Hypertension Father   . Diabetes Mellitus II Paternal Uncle   . Colon cancer Neg Hx     No Known Allergies  Current Outpatient Prescriptions on File Prior to Visit  Medication Sig Dispense Refill  . aspirin 81 MG tablet Take 81 mg by mouth daily.    Marland Kitchen atorvastatin (LIPITOR)  40 MG tablet TAKE 1 TABLET BY MOUTH AT BEDTIME. 90 tablet 3  . carvedilol (COREG) 6.25 MG tablet Take 1 tablet (6.25 mg total) by mouth 2 (two) times daily with a meal. 180 tablet 3  . CIALIS 5 MG tablet TAKE 1 TABLET (5 MG TOTAL) BY MOUTH DAILY AS NEEDED FOR ERECTILE DYSFUNCTION. 10 tablet 0  . furosemide (LASIX) 40 MG tablet Take 1 tablet (40 mg total) by mouth 2 (two) times daily. 180 tablet 3  . glipiZIDE (GLUCOTROL XL) 5 MG 24 hr tablet Take 1 tablet (5 mg total) by mouth daily with breakfast. 90 tablet 1  . losartan (COZAAR) 100 MG tablet Take 1 tablet (100 mg total) by mouth daily. 90 tablet 3  . metFORMIN (GLUCOPHAGE) 1000 MG tablet Take 1 tablet (1,000 mg total) by mouth 2 (two) times daily with a meal. 180 tablet 1  . spironolactone (ALDACTONE) 25 MG tablet Take 1 tablet (25 mg total) by mouth 2 (two) times daily. 180 tablet 3   Current Facility-Administered Medications on File Prior to Visit  Medication Dose Route Frequency Provider Last Rate Last Dose  . 0.9 %  sodium chloride infusion  500 mL Intravenous Continuous Iva Boop, MD      . acetaminophen (TYLENOL) tablet 650 mg  650 mg Oral Q4H PRN Antonieta Iba, MD        BP 136/82   Pulse 73   Temp 98.4 F (36.9 C) (Oral)   Ht 6' (1.829 m)   Wt 234 lb 12.8 oz (106.5 kg)   SpO2 98%   BMI 31.84 kg/m    Objective:   Physical Exam  Constitutional: He appears well-nourished.  Neck: Neck supple.  Cardiovascular: Normal rate and regular rhythm.   Pulmonary/Chest: Effort normal and breath sounds normal. No respiratory distress.  Skin: Skin is warm and dry.          Assessment & Plan:

## 2016-03-23 NOTE — Assessment & Plan Note (Signed)
Due for cardiology and CHF clinic evaluation next week. No lower extremity edema, shortness of breath, cough. Compliant to medications.

## 2016-03-23 NOTE — Telephone Encounter (Signed)
Patient did come in for his follow-up appointment this morning. Please review.

## 2016-03-23 NOTE — Patient Instructions (Signed)
Complete lab work prior to leaving today. I will notify you of your results once received.   Follow up in 6 months for your annual physical.  It was a pleasure to see you today!

## 2016-03-23 NOTE — Assessment & Plan Note (Signed)
Stable in the office today.  Continue current regimen.  BMP pending. 

## 2016-03-23 NOTE — Progress Notes (Signed)
Pre visit review using our clinic review tool, if applicable. No additional management support is needed unless otherwise documented below in the visit note. 

## 2016-03-23 NOTE — Assessment & Plan Note (Signed)
A1C of 6.9 last visit. Due for recheck today, if stable then will go every 6 months for check. Urine microalbumin pending. Managed on ARB and statin. Continue Metformin and Glipizide.

## 2016-03-23 NOTE — Assessment & Plan Note (Signed)
Last lipid panel stable with the exception of Trigs. Has been compliant to Fish Oil, recheck lipids today. Continue atorvastatin.

## 2016-03-23 NOTE — Telephone Encounter (Signed)
Patient did not come in for their appointment today for 6 mo follow up  Please let me know if patient needs to be contacted immediately for follow up or no follow up needed. °

## 2016-03-27 ENCOUNTER — Encounter: Payer: Self-pay | Admitting: Cardiovascular Disease

## 2016-03-27 ENCOUNTER — Ambulatory Visit (INDEPENDENT_AMBULATORY_CARE_PROVIDER_SITE_OTHER): Payer: Managed Care, Other (non HMO) | Admitting: Cardiovascular Disease

## 2016-03-27 VITALS — BP 120/64 | HR 80 | Ht 72.0 in | Wt 232.5 lb

## 2016-03-27 DIAGNOSIS — I712 Thoracic aortic aneurysm, without rupture: Secondary | ICD-10-CM

## 2016-03-27 DIAGNOSIS — I10 Essential (primary) hypertension: Secondary | ICD-10-CM

## 2016-03-27 DIAGNOSIS — I77810 Thoracic aortic ectasia: Secondary | ICD-10-CM | POA: Diagnosis not present

## 2016-03-27 DIAGNOSIS — I359 Nonrheumatic aortic valve disorder, unspecified: Secondary | ICD-10-CM | POA: Diagnosis not present

## 2016-03-27 DIAGNOSIS — I7121 Aneurysm of the ascending aorta, without rupture: Secondary | ICD-10-CM

## 2016-03-27 DIAGNOSIS — I420 Dilated cardiomyopathy: Secondary | ICD-10-CM

## 2016-03-27 DIAGNOSIS — I5021 Acute systolic (congestive) heart failure: Secondary | ICD-10-CM | POA: Diagnosis not present

## 2016-03-27 DIAGNOSIS — I429 Cardiomyopathy, unspecified: Secondary | ICD-10-CM

## 2016-03-27 NOTE — Progress Notes (Signed)
Cardiology Office Note  Date:  03/27/2016   ID:  Dennis Curry, DOB [birth year], MRN [id number]  PCP:  Morrie Sheldon, NP   Chief Complaint  Patient presents with  . other    12 month follow up. Meds reviewed by the patient verbally. "doing well."     HPI:  50 year old gentleman who in early 2016 was doing well, developed progressive chest congestion initially felt to be allergies, treated with several rounds of ABX,  presenting to the hospital with worsening shortness of breath found to have acute systolic CHF, EF 40%, dilated ascending aorta, cardiac catheterization showing no significant CAD, aorta 4.5 cm on TEE, who presents for routine follow-up of his systolic CHF.  In follow-up, he reports that he feels well with no complaints. No shortness of breath, no chest pain, no leg swelling, no change in his weight He does report rare episodes of lightheadedness consistent with orthostasis Overall no complaints No regular exercise program, weight is stable Hemoglobin A1c improved down to 6.9  Other lab work reviewed with him LDL 44 Total chol 106, TG 981  Previous echocardiogram reviewed with him Previous echocardiogram 2016 shows ejection fraction has improved up to 45-50%. Moderately dilated ascending aorta and aortic root  EKG on today's visit shows normal sinus rhythm with rate 73 bpm, right bundle branch block, left anterior fascicular block, rare PVC  PMH:   has a past medical history of Aortic insufficiency; Ascending aorta dilatation (HCC); Chronic systolic CHF (congestive heart failure) (HCC); Diabetes mellitus without complication (HCC); Hyperlipidemia; Hypertension; and Nonischemic dilated cardiomyopathy (HCC).  PSH:    Past Surgical History:  Procedure Laterality Date  . CARDIAC CATHETERIZATION N/A 12/02/2014   Procedure: Left Heart Cath and Coronary Angiography;  Surgeon: Antonieta Iba, MD;  Location: [facility] INVASIVE CV LAB;  Service: Cardiovascular;   Laterality: N/A;  . 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 CORONARY ANGIOPLASTY    . OTHER SURGICAL HISTORY     none    Current Outpatient Prescriptions  Medication Sig Dispense Refill  . aspirin 81 MG tablet Take 81 mg by mouth daily.    Marland Kitchen atorvastatin (LIPITOR) 40 MG tablet TAKE 1 TABLET BY MOUTH AT BEDTIME. 90 tablet 3  . carvedilol (COREG) 6.25 MG tablet Take 1 tablet (6.25 mg total) by mouth 2 (two) times daily with a meal. 180 tablet 3  . CIALIS 5 MG tablet TAKE 1 TABLET (5 MG TOTAL) BY MOUTH DAILY AS NEEDED FOR ERECTILE DYSFUNCTION. 10 tablet 0  . furosemide (LASIX) 40 MG tablet Take 1 tablet (40 mg total) by mouth 2 (two) times daily. 180 tablet 3  . glipiZIDE (GLUCOTROL XL) 5 MG 24 hr tablet Take 1 tablet (5 mg total) by mouth daily with breakfast. 90 tablet 1  . losartan (COZAAR) 100 MG tablet Take 1 tablet (100 mg total) by mouth daily. 90 tablet 3  . metFORMIN (GLUCOPHAGE) 1000 MG tablet Take 1 tablet (1,000 mg total) by mouth 2 (two) times daily with a meal. 180 tablet 1  . spironolactone (ALDACTONE) 25 MG tablet Take 1 tablet (25 mg total) by mouth 2 (two) times daily. 180 tablet 3   Current Facility-Administered Medications  Medication Dose Route Frequency Provider Last Rate Last Dose  . 0.9 %  sodium chloride infusion  500 mL Intravenous Continuous Iva Boop, MD       Facility-Administered Medications Ordered in Other Visits  Medication Dose Route Frequency Provider Last Rate Last  Dose  . acetaminophen (TYLENOL) tablet 650 mg  650 mg Oral Q4H PRN Antonieta Ibaimothy J Kraig Genis, MD         Allergies:   Review of patient's allergies indicates no known allergies.   Social History:  The patient  reports that he has never smoked. He has never used smokeless tobacco. He reports that he does not drink alcohol or use drugs.   Family History:   family history includes Coronary artery disease in his mother; Diabetes Mellitus II in his paternal uncle;  Hypertension in his father.    Review of Systems: Review of Systems  Constitutional: Negative.   Respiratory: Negative.   Cardiovascular: Negative.   Gastrointestinal: Negative.   Musculoskeletal: Negative.   Neurological: Positive for dizziness.  Psychiatric/Behavioral: Negative.   All other systems reviewed and are negative.    PHYSICAL EXAM: VS:  BP 120/64 (BP Location: Left Arm, Patient Position: Sitting, Cuff Size: Normal)   Pulse 80   Ht 6' (1.829 m)   Wt 232 lb 8 oz (105.5 kg)   BMI 31.53 kg/m  , BMI Body mass index is 31.53 kg/m. GEN: Well nourished, well developed, in no acute distress  HEENT: normal  Neck: no JVD, carotid bruits, or masses Cardiac: RRR; no murmurs, rubs, or gallops,no edema  Respiratory:  clear to auscultation bilaterally, normal work of breathing GI: soft, nontender, nondistended, + BS MS: no deformity or atrophy  Skin: warm and dry, no rash Neuro:  Strength and sensation are intact Psych: euthymic mood, full affect    Recent Labs: 09/01/2015: ALT 18 03/23/2016: BUN 21; Creatinine, Ser 1.29; Potassium 4.0; Sodium 137    Lipid Panel Lab Results  Component Value Date   CHOL 106 03/23/2016   HDL 26.00 (L) 03/23/2016   TRIG 341.0 (H) 03/23/2016      Wt Readings from Last 3 Encounters:  03/27/16 232 lb 8 oz (105.5 kg)  03/23/16 234 lb 12.8 oz (106.5 kg)  02/25/16 234 lb (106.1 kg)       ASSESSMENT AND PLAN:  Ascending aorta dilation (HCC) - Plan: ECHOCARDIOGRAM COMPLETE We have recommended repeat echocardiogram to evaluate aortic root, ascending aorta given moderate dilation in 2016  Acute systolic CHF (congestive heart failure) (HCC) - Plan: EKG 12-Lead, ECHOCARDIOGRAM COMPLETE Appears relatively euvolemic, in fact BUN and creatinine borderline elevated Recommended he hold Lasix in the afternoon take as needed for weight gain and ankle swelling, continue dose in the morning  Aortic valve disorder - Plan: EKG 12-Lead,  ECHOCARDIOGRAM COMPLETE Moderate regurgitation noted on previous echocardiogram Repeat echocardiogram ordered to review  Essential hypertension - Plan: EKG 12-Lead, ECHOCARDIOGRAM COMPLETE Blood pressure is well controlled on today's visit. No changes made to the medications.  Nonischemic dilated cardiomyopathy (HCC) - Plan: EKG 12-Lead, ECHOCARDIOGRAM COMPLETE Previous echocardiogram with much improved ejection fraction We'll decrease Lasix given lightheadedness, borderline elevated renal function  Aneurysm, ascending aorta (HCC) Echocardiogram ordered as above   Total encounter time more than 25 minutes  Greater than 50% was spent in counseling and coordination of care with the patient   Disposition:   F/U  12 months   Orders Placed This Encounter  Procedures  . EKG 12-Lead  . ECHOCARDIOGRAM COMPLETE     Signed, Dossie Arbourim Mansoor Hillyard, M.D., Ph.D. 03/27/2016  [facility] [place] [phone]

## 2016-03-27 NOTE — Patient Instructions (Addendum)
Medication Instructions:   Please decrease the lasix down to one a day  Take extra afternoon furosemide for ankle swelling, bloating  Labwork:  No new labs needed  Testing/Procedures:  We will schedule an echocardiogram for ascending aorta dilation/aneurysm  Echocardiography is a painless test that uses sound waves to create images of your heart. It provides your doctor with information about the size and shape of your heart and how well your heart's chambers and valves are working. This procedure takes approximately one hour. There are no restrictions for this procedure.  Follow-Up: It was a pleasure seeing you in the office today. Please call us if you have new issues that need to be addressed before your next appt.  [phone]  Your physician wants you to follow-up in: 12 months.  You will receive a reminder letter in the mail two months in advance. If you don't receive a letter, please call our office to schedule the follow-up appointment.  If you need a refill on your cardiac medications before your next appointment, please call your pharmacy.     Echocardiogram An echocardiogram, or echocardiography, uses sound waves (ultrasound) to produce an image of your heart. The echocardiogram is simple, painless, obtained within a short period of time, and offers valuable information to your health care provider. The images from an echocardiogram can provide information such as:  Evidence of coronary artery disease (CAD).  Heart size.  Heart muscle function.  Heart valve function.  Aneurysm detection.  Evidence of a past heart attack.  Fluid buildup around the heart.  Heart muscle thickening.  Assess heart valve function. LET [facility] PROVIDER KNOW ABOUT:  Any allergies you have.  All medicines you are taking, including vitamins, herbs, eye drops, creams, and over-the-counter medicines.  Previous problems you or members of your family have had with the use  of anesthetics.  Any blood disorders you have.  Previous surgeries you have had.  Medical conditions you have.  Possibility of pregnancy, if this applies. BEFORE THE PROCEDURE  No special preparation is needed. Eat and drink normally.  PROCEDURE   In order to produce an image of your heart, gel will be applied to your chest and a wand-like tool (transducer) will be moved over your chest. The gel will help transmit the sound waves from the transducer. The sound waves will harmlessly bounce off your heart to allow the heart images to be captured in real-time motion. These images will then be recorded.  You may need an IV to receive a medicine that improves the quality of the pictures. AFTER THE PROCEDURE You may return to your normal schedule including diet, activities, and medicines, unless your health care provider tells you otherwise.   This information is not intended to replace advice given to you by your health care provider. Make sure you discuss any questions you have with your health care provider.   Document Released: 06/23/2000 Document Revised: 07/17/2014 Document Reviewed: 03/03/2013 Elsevier Interactive Patient Education [employer]2016 Elsevier Inc.

## 2016-04-07 ENCOUNTER — Other Ambulatory Visit: Payer: Self-pay | Admitting: Primary Care

## 2016-04-07 DIAGNOSIS — IMO0001 Reserved for inherently not codable concepts without codable children: Secondary | ICD-10-CM

## 2016-04-07 DIAGNOSIS — E1165 Type 2 diabetes mellitus with hyperglycemia: Principal | ICD-10-CM

## 2016-04-12 ENCOUNTER — Other Ambulatory Visit: Payer: Self-pay | Admitting: Cardiovascular Disease

## 2016-04-12 DIAGNOSIS — I10 Essential (primary) hypertension: Secondary | ICD-10-CM

## 2016-04-17 ENCOUNTER — Ambulatory Visit (INDEPENDENT_AMBULATORY_CARE_PROVIDER_SITE_OTHER): Payer: Managed Care, Other (non HMO)

## 2016-04-17 ENCOUNTER — Other Ambulatory Visit: Payer: Self-pay

## 2016-04-17 DIAGNOSIS — I5021 Acute systolic (congestive) heart failure: Secondary | ICD-10-CM

## 2016-04-17 DIAGNOSIS — I429 Cardiomyopathy, unspecified: Secondary | ICD-10-CM

## 2016-04-17 DIAGNOSIS — I10 Essential (primary) hypertension: Secondary | ICD-10-CM | POA: Diagnosis not present

## 2016-04-17 DIAGNOSIS — I420 Dilated cardiomyopathy: Secondary | ICD-10-CM

## 2016-04-17 DIAGNOSIS — I359 Nonrheumatic aortic valve disorder, unspecified: Secondary | ICD-10-CM | POA: Diagnosis not present

## 2016-04-17 DIAGNOSIS — I77810 Thoracic aortic ectasia: Secondary | ICD-10-CM

## 2016-04-24 ENCOUNTER — Other Ambulatory Visit: Payer: Self-pay

## 2016-04-25 ENCOUNTER — Other Ambulatory Visit: Payer: Self-pay

## 2016-04-25 DIAGNOSIS — I719 Aortic aneurysm of unspecified site, without rupture: Secondary | ICD-10-CM

## 2016-04-26 ENCOUNTER — Ambulatory Visit: Payer: Managed Care, Other (non HMO) | Attending: Family | Admitting: Family

## 2016-04-26 ENCOUNTER — Encounter: Payer: Self-pay | Admitting: Family

## 2016-04-26 VITALS — BP 132/74 | HR 79 | Resp 18 | Ht 72.0 in | Wt 234.0 lb

## 2016-04-26 DIAGNOSIS — Z8489 Family history of other specified conditions: Secondary | ICD-10-CM | POA: Diagnosis not present

## 2016-04-26 DIAGNOSIS — Z7982 Long term (current) use of aspirin: Secondary | ICD-10-CM | POA: Diagnosis not present

## 2016-04-26 DIAGNOSIS — I110 Hypertensive heart disease with heart failure: Secondary | ICD-10-CM | POA: Insufficient documentation

## 2016-04-26 DIAGNOSIS — Z9889 Other specified postprocedural states: Secondary | ICD-10-CM | POA: Insufficient documentation

## 2016-04-26 DIAGNOSIS — E119 Type 2 diabetes mellitus without complications: Secondary | ICD-10-CM | POA: Insufficient documentation

## 2016-04-26 DIAGNOSIS — Z79899 Other long term (current) drug therapy: Secondary | ICD-10-CM | POA: Diagnosis not present

## 2016-04-26 DIAGNOSIS — Z808 Family history of malignant neoplasm of other organs or systems: Secondary | ICD-10-CM | POA: Diagnosis not present

## 2016-04-26 DIAGNOSIS — I712 Thoracic aortic aneurysm, without rupture: Secondary | ICD-10-CM

## 2016-04-26 DIAGNOSIS — I10 Essential (primary) hypertension: Secondary | ICD-10-CM

## 2016-04-26 DIAGNOSIS — I5022 Chronic systolic (congestive) heart failure: Secondary | ICD-10-CM | POA: Diagnosis not present

## 2016-04-26 DIAGNOSIS — Z833 Family history of diabetes mellitus: Secondary | ICD-10-CM | POA: Diagnosis not present

## 2016-04-26 DIAGNOSIS — I351 Nonrheumatic aortic (valve) insufficiency: Secondary | ICD-10-CM | POA: Diagnosis not present

## 2016-04-26 DIAGNOSIS — Z7984 Long term (current) use of oral hypoglycemic drugs: Secondary | ICD-10-CM | POA: Insufficient documentation

## 2016-04-26 DIAGNOSIS — E785 Hyperlipidemia, unspecified: Secondary | ICD-10-CM | POA: Insufficient documentation

## 2016-04-26 DIAGNOSIS — I7121 Aneurysm of the ascending aorta, without rupture: Secondary | ICD-10-CM

## 2016-04-26 NOTE — Patient Instructions (Signed)
Continue weighing daily and call for an overnight weight gain of > 2 pounds or a weekly weight gain of >5 pounds. 

## 2016-04-26 NOTE — Progress Notes (Signed)
Patient ID: Dennis Curry, male    DOB: 1965-07-26, [age] y.o.   MRN: [id number]  HPI  Mr Dennis Curry is a 50 y/o male with a history of HTN, hyperlipidemia, DM, ascending aorta dilatation, aortic insufficiency and chronic heart failure.  Last echo was done 04/17/16 which showed an improvement in his EF to 50-55% without any regurgitation. Normal PA pressure. Moderate/severe dilated aortic root. Echo done on 02/26/15 showed an EF of 45-50% and echo done on 11/30/14 showed an EF of 25-30%. Last cardiac catheterization was done 12/02/14.  Was in the ED on 10/02/15 with a viral respiratory infection. Was discharged home with augmentin. Has not been admitted since 11/30/14 when he had HF exacerbation after being treated recently before that for bronchitis and sinus infection.   He presents today for a follow-up visit without any complaints of fatigue, shortness of breath or swelling in his legs/abdomen. Has continued to weigh himself daily and has lost some more weight. Will be having a chest CT done to evaluate his aorta.   Past Medical History:  Diagnosis Date  . Aortic insufficiency    a. TTE 11/30/14: EF 25-30%, bicupid valve cannot be excluded, mod-severe aortic regurge, aortic root dimension 52 mm, mild MR, mildly dilated LA/RA, PASP 60; TEE 12/01/14: EF 25-30%, diffuse HK, aortic valve trileaflet, mild to mod Ao regurge, ascending aortic grossly measured at 4.5 cm (not well visualized),   . Ascending aorta dilatation (HCC)    a. 52 mm by TTE 11/2014; b. 45 mm by TEE 11/2014 (not well visualized)  . Chronic systolic CHF (congestive heart failure) (HCC)    a. TTE 11/30/14: EF 25-30%, bicupid valve cannot be excluded, mod-severe aortic regurge, aortic root dimension 52 mm, mild MR, mildly dilated LA/RA, PASP 60; TEE 12/01/14: EF 25-30%, diffuse HK, aortic valve trileaflet, mild to mod Ao regurge, ascending aortic grossly measured at 4.5 cm (not well visualized),   . Diabetes mellitus without complication (HCC)    . Hyperlipidemia   . Hypertension   . Nonischemic dilated cardiomyopathy (HCC)    a. cath 12/02/14: right dom coronary system w/ mild lumenal irregs, o/w no sig CAD, sev diffuse HK EF 25%, no sig MR or AS, med Rx recommeded     Past Surgical History:  Procedure Laterality Date  . CARDIAC CATHETERIZATION N/A 12/02/2014   Procedure: Left Heart Cath and Coronary Angiography;  Surgeon: Antonieta Ibaimothy J Gollan, MD;  Location: [facility] INVASIVE CV LAB;  Service: Cardiovascular;  Laterality: N/A;  . 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 CORONARY ANGIOPLASTY    . OTHER SURGICAL HISTORY     none    Family History  Problem Relation Age of Onset  . Coronary artery disease Mother   . Hypertension Father   . Diabetes Mellitus II Paternal Uncle   . Colon cancer Neg Hx     Social History  Substance Use Topics  . Smoking status: Never Smoker  . Smokeless tobacco: Never Used  . Alcohol use No    No Known Allergies  Prior to Admission medications   Medication Sig Start Date End Date Taking? Authorizing Provider  aspirin 81 MG tablet Take 81 mg by mouth daily.   Yes Historical Provider, MD  atorvastatin (LIPITOR) 40 MG tablet TAKE 1 TABLET BY MOUTH AT BEDTIME. 03/14/16  Yes Antonieta Ibaimothy J Gollan, MD  carvedilol (COREG) 6.25 MG tablet TAKE 1 TABLET BY MOUTH 2 TIMES DAILY WITH A MEAL. 04/12/16  Yes Marcial Pacasimothy  Elmarie Mainland, MD  CIALIS 5 MG tablet TAKE 1 TABLET (5 MG TOTAL) BY MOUTH DAILY AS NEEDED FOR ERECTILE DYSFUNCTION. 01/06/16  Yes [name], NP  furosemide (LASIX) 40 MG tablet Take 1 tablet (40 mg total) by mouth 2 (two) times daily. Patient taking differently: Take 40 mg by mouth daily.  09/01/15  Yes Antonieta Iba, MD  glipiZIDE (GLUCOTROL XL) 5 MG 24 hr tablet TAKE 1 TABLET BY MOUTH DAILY WITH BREAKFAST. 04/07/16  Yes [name], NP  losartan (COZAAR) 100 MG tablet Take 1 tablet (100 mg total) by mouth daily. 03/26/15  Yes Antonieta Iba, MD  metFORMIN (GLUCOPHAGE)  1000 MG tablet TAKE 1 TABLET BY MOUTH 2 (TWO) TIMES DAILY WITH A MEAL. 04/07/16  Yes [name], NP  spironolactone (ALDACTONE) 25 MG tablet Take 1 tablet (25 mg total) by mouth 2 (two) times daily. 03/26/15  Yes Antonieta Iba, MD     Review of Systems  Constitutional: Negative for appetite change and fatigue.  HENT: Negative for congestion, postnasal drip and sore throat.   Eyes: Negative.   Respiratory: Negative for cough, chest tightness and shortness of breath.   Cardiovascular: Negative for chest pain, palpitations and leg swelling.  Gastrointestinal: Negative for abdominal distention and abdominal pain.  Endocrine: Negative.   Genitourinary: Negative.   Musculoskeletal: Negative for back pain and neck pain.  Skin: Negative.   Allergic/Immunologic: Negative.   Neurological: Negative for dizziness and light-headedness.  Hematological: Negative for adenopathy. Does not bruise/bleed easily.  Psychiatric/Behavioral: Negative for dysphoric mood and sleep disturbance (sleeping on 1.5 pillows). The patient is not nervous/anxious.    Vitals:   04/26/16 0902  BP: 132/74  Pulse: 79  Resp: 18  SpO2: 98%  Weight: 234 lb (106.1 kg)  Height: 6' (1.829 m)      Physical Exam  Constitutional: He is oriented to person, place, and time. He appears well-developed and well-nourished.  HENT:  Head: Normocephalic and atraumatic.  Eyes: Conjunctivae are normal. Pupils are equal, round, and reactive to light.  Neck: Normal range of motion. Neck supple.  Cardiovascular: Normal rate and regular rhythm.   Pulmonary/Chest: Effort normal. He has no wheezes. He has no rales.  Abdominal: Soft. He exhibits no distension. There is no tenderness.  Musculoskeletal: He exhibits no edema or tenderness.  Neurological: He is alert and oriented to person, place, and time.  Skin: Skin is warm and dry.  Psychiatric: He has a normal mood and affect. His behavior is normal. Thought content normal.   Nursing note and vitals reviewed.  Assessment & Plan:  1: Chronic heart failure with reduced ejection fraction- - EF has now normalized - NYHA class I - euvolemic - continue daily weighing. Can take 2nd dose of furosemide if needed for weight gain or edema - saw cardiologist Mariah Milling) on 03/27/16  2: HTN- - BP looks great today  3: Ascending aorta- - having chest CT done 05/03/16  Return here as needed for any questions/problems.

## 2016-05-03 ENCOUNTER — Ambulatory Visit
Admission: RE | Admit: 2016-05-03 | Discharge: 2016-05-03 | Disposition: A | Discharge status: Home / Self Care | Payer: Managed Care, Other (non HMO) | Source: Ambulatory Visit | Attending: Cardiovascular Disease | Admitting: Cardiovascular Disease

## 2016-05-03 ENCOUNTER — Other Ambulatory Visit: Payer: Self-pay | Admitting: Cardiovascular Disease

## 2016-05-03 DIAGNOSIS — I2510 Atherosclerotic heart disease of native coronary artery without angina pectoris: Secondary | ICD-10-CM | POA: Insufficient documentation

## 2016-05-03 DIAGNOSIS — I719 Aortic aneurysm of unspecified site, without rupture: Secondary | ICD-10-CM

## 2016-05-03 DIAGNOSIS — R918 Other nonspecific abnormal finding of lung field: Secondary | ICD-10-CM | POA: Insufficient documentation

## 2016-05-03 DIAGNOSIS — I712 Thoracic aortic aneurysm, without rupture: Secondary | ICD-10-CM | POA: Insufficient documentation

## 2016-05-03 MED ORDER — IOPAMIDOL (ISOVUE-370) INJECTION 76%
75.0000 mL | Freq: Once | INTRAVENOUS | Status: AC | PRN
  Administered 2016-05-03: 75 mL via INTRAVENOUS

## 2016-05-12 ENCOUNTER — Other Ambulatory Visit: Payer: Self-pay | Admitting: Primary Care

## 2016-05-12 DIAGNOSIS — E041 Nontoxic single thyroid nodule: Secondary | ICD-10-CM

## 2016-05-16 ENCOUNTER — Other Ambulatory Visit: Payer: Self-pay | Admitting: Cardiovascular Disease

## 2016-05-16 DIAGNOSIS — I10 Essential (primary) hypertension: Secondary | ICD-10-CM

## 2016-05-23 ENCOUNTER — Other Ambulatory Visit (INDEPENDENT_AMBULATORY_CARE_PROVIDER_SITE_OTHER): Payer: Managed Care, Other (non HMO)

## 2016-05-23 DIAGNOSIS — E041 Nontoxic single thyroid nodule: Secondary | ICD-10-CM

## 2016-05-23 LAB — TSH: TSH: 2.76 u[IU]/mL (ref 0.35–4.50)

## 2016-05-26 ENCOUNTER — Ambulatory Visit
Admission: RE | Admit: 2016-05-26 | Discharge: 2016-05-26 | Disposition: A | Discharge status: Home / Self Care | Payer: Managed Care, Other (non HMO) | Source: Ambulatory Visit | Attending: Primary Care | Admitting: Primary Care

## 2016-05-26 DIAGNOSIS — E041 Nontoxic single thyroid nodule: Secondary | ICD-10-CM | POA: Diagnosis not present

## 2016-06-09 ENCOUNTER — Other Ambulatory Visit: Payer: Self-pay | Admitting: Cardiovascular Disease

## 2016-06-27 ENCOUNTER — Other Ambulatory Visit: Payer: Self-pay | Admitting: Primary Care

## 2016-09-15 ENCOUNTER — Other Ambulatory Visit: Payer: Self-pay | Admitting: Cardiovascular Disease

## 2016-09-29 ENCOUNTER — Other Ambulatory Visit: Payer: Self-pay | Admitting: Primary Care

## 2016-09-29 DIAGNOSIS — N529 Male erectile dysfunction, unspecified: Secondary | ICD-10-CM

## 2016-09-29 NOTE — Telephone Encounter (Signed)
Last filled 06/28/16--please advise 

## 2016-09-30 ENCOUNTER — Other Ambulatory Visit: Payer: Self-pay | Admitting: Primary Care

## 2016-09-30 DIAGNOSIS — E1165 Type 2 diabetes mellitus with hyperglycemia: Principal | ICD-10-CM

## 2016-09-30 DIAGNOSIS — IMO0001 Reserved for inherently not codable concepts without codable children: Secondary | ICD-10-CM

## 2016-11-20 ENCOUNTER — Encounter: Payer: Self-pay | Admitting: Primary Care

## 2016-11-20 ENCOUNTER — Ambulatory Visit (INDEPENDENT_AMBULATORY_CARE_PROVIDER_SITE_OTHER): Payer: Managed Care, Other (non HMO) | Admitting: Primary Care

## 2016-11-20 VITALS — BP 138/80 | HR 80 | Temp 98.2°F | Ht 72.0 in | Wt 228.8 lb

## 2016-11-20 DIAGNOSIS — I5022 Chronic systolic (congestive) heart failure: Secondary | ICD-10-CM

## 2016-11-20 DIAGNOSIS — E785 Hyperlipidemia, unspecified: Secondary | ICD-10-CM

## 2016-11-20 DIAGNOSIS — E119 Type 2 diabetes mellitus without complications: Secondary | ICD-10-CM

## 2016-11-20 DIAGNOSIS — I10 Essential (primary) hypertension: Secondary | ICD-10-CM | POA: Diagnosis not present

## 2016-11-20 NOTE — Assessment & Plan Note (Signed)
Home readings at goal. Continue current regimen. BMP pending.

## 2016-11-20 NOTE — Patient Instructions (Addendum)
Schedule a lab only appointment to return fasting for your labs.  Please schedule a physical with me in 6 months. You may also schedule a lab only appointment 3-4 days prior. We will discuss your lab results in detail during your physical.  Continue to work on a healthy diet.  Start exercising. You should be getting 150 minutes of moderate intensity exercise weekly.  Ensure you are consuming 64 ounces of water daily.  It was a pleasure to see you today!

## 2016-11-20 NOTE — Assessment & Plan Note (Signed)
Lipid panel pending. Continue atorvastatin. 

## 2016-11-20 NOTE — Progress Notes (Signed)
Subjective:    Patient ID: Dennis Curry, male    DOB: March 03, 1966, 51 y.o.   MRN: [id number]  HPI  Dennis Curry is a 51 year old male who presents today for follow up.  1) Type 2 Diabetes: Currently managed on Metformin 1000 mg BID, glipizide XL 5 mg. His last A1C was 6.9 in September 2017. He's checking his sugars sporadically fasting in the AM and is getting readings of 130's.   2) Hyperlipidemia: Currently managed on atorvastatin 40 mg. His last lipid panel in September 2017 was above goal as his triglycerides were 341 and VLDL was 68. He is due for repeat lipids but is not fasting.   3) Essential Hypertension: Currently managed on carvedilol 6.25 mg BID, losartan 100 mg, spironolactone 25 mg BID, and furosemide 40 mg. His BP in the office today is 138/80. He's checking his BP at home and is getting readings of 110/70's on average.   4) CAD/CHF: Currently managed on furosemide 40 mg, carvedilol 6.25 mg BID. He denies chest pain, shortness of breath. His last cardiology appointment was in September 2017.   Review of Systems  Constitutional: Negative for fatigue.  Eyes: Negative for visual disturbance.  Respiratory: Negative for shortness of breath.   Cardiovascular: Negative for chest pain, palpitations and leg swelling.  Neurological: Negative for dizziness, numbness and headaches.       Past Medical History:  Diagnosis Date  . Aortic insufficiency    a. TTE 11/30/14: EF 25-30%, bicupid valve cannot be excluded, mod-severe aortic regurge, aortic root dimension 52 mm, mild MR, mildly dilated LA/RA, PASP 60; TEE 12/01/14: EF 25-30%, diffuse HK, aortic valve trileaflet, mild to mod Ao regurge, ascending aortic grossly measured at 4.5 cm (not well visualized),   . Ascending aorta dilatation (HCC)    a. 52 mm by TTE 11/2014; b. 45 mm by TEE 11/2014 (not well visualized)  . Chronic systolic CHF (congestive heart failure) (HCC)    a. TTE 11/30/14: EF 25-30%, bicupid valve cannot be excluded,  mod-severe aortic regurge, aortic root dimension 52 mm, mild MR, mildly dilated LA/RA, PASP 60; TEE 12/01/14: EF 25-30%, diffuse HK, aortic valve trileaflet, mild to mod Ao regurge, ascending aortic grossly measured at 4.5 cm (not well visualized),   . Diabetes mellitus without complication (HCC)   . Hyperlipidemia   . Hypertension   . Nonischemic dilated cardiomyopathy (HCC)    a. cath 12/02/14: right dom coronary system w/ mild lumenal irregs, o/w no sig CAD, sev diffuse HK EF 25%, no sig MR or AS, med Rx recommeded      Social History   Social History  . Marital status: Married    Spouse name: N/A  . Number of children: N/A  . Years of education: N/A   Occupational History  . Not on file.   Social History Main Topics  . Smoking status: Never Smoker  . Smokeless tobacco: Never Used  . Alcohol use No  . Drug use: No  . Sexual activity: Not on file   Other Topics Concern  . Not on file   Social History Narrative   Married.   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   No children.   Enjoys playing guitar, traveling, spending time with his wife.    Past Surgical History:  Procedure Laterality Date  . CARDIAC CATHETERIZATION N/A 12/02/2014   Procedure: Left Heart Cath and Coronary Angiography;  Surgeon: Antonieta Ibaimothy J Gollan, MD;  Location: [facility] INVASIVE CV LAB;  Service:  Cardiovascular;  Laterality: N/A;  . 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 CORONARY ANGIOPLASTY    . OTHER SURGICAL HISTORY     none    Family History  Problem Relation Age of Onset  . Coronary artery disease Mother   . Hypertension Father   . Diabetes Mellitus II Paternal Uncle   . Colon cancer Neg Hx     No Known Allergies  Current Outpatient Prescriptions on File Prior to Visit  Medication Sig Dispense Refill  . aspirin 81 MG tablet Take 81 mg by mouth daily.    Marland Kitchen atorvastatin (LIPITOR) 40 MG tablet TAKE 1 TABLET BY MOUTH AT BEDTIME. 90 tablet 3  . carvedilol (COREG) 6.25  MG tablet TAKE 1 TABLET BY MOUTH 2 TIMES DAILY WITH A MEAL. 180 tablet 3  . CIALIS 5 MG tablet TAKE 1 TABLET (5 MG TOTAL) BY MOUTH DAILY AS NEEDED FOR ERECTILE DYSFUNCTION. 10 tablet 0  . furosemide (LASIX) 40 MG tablet TAKE 1 TABLET (40 MG TOTAL) BY MOUTH 2 (TWO) TIMES DAILY. 180 tablet 3  . glipiZIDE (GLUCOTROL XL) 5 MG 24 hr tablet TAKE 1 TABLET BY MOUTH DAILY WITH BREAKFAST. 90 tablet 0  . losartan (COZAAR) 100 MG tablet TAKE 1 TABLET BY MOUTH EVERY DAY 90 tablet 3  . metFORMIN (GLUCOPHAGE) 1000 MG tablet TAKE 1 TABLET BY MOUTH 2 (TWO) TIMES DAILY WITH A MEAL. 180 tablet 0  . spironolactone (ALDACTONE) 25 MG tablet TAKE 1 TABLET BY MOUTH 2 TIMES DAILY. 180 tablet 3   Current Facility-Administered Medications on File Prior to Visit  Medication Dose Route Frequency Provider Last Rate Last Dose  . 0.9 %  sodium chloride infusion  500 mL Intravenous Continuous Iva Boop, MD      . acetaminophen (TYLENOL) tablet 650 mg  650 mg Oral Q4H PRN Gollan, Tollie Pizza, MD        BP 138/80   Pulse 80   Temp 98.2 F (36.8 C) (Oral)   Ht 6' (1.829 m)   Wt 228 lb 12.8 oz (103.8 kg)   SpO2 98%   BMI 31.03 kg/m    Objective:   Physical Exam  Constitutional: He appears well-nourished.  Neck: Neck supple.  Cardiovascular: Normal rate and regular rhythm.   Pulmonary/Chest: Effort normal and breath sounds normal.  Skin: Skin is warm and dry.          Assessment & Plan:

## 2016-11-20 NOTE — Assessment & Plan Note (Signed)
Due for repeat A1C today. Continue Metformin and Glipizide. Managed on ARB and statin.

## 2016-11-20 NOTE — Assessment & Plan Note (Signed)
Asymptomatic. Lasix dose reduced to 40 mg once daily last Fall. Exam today unremarkable. Continue cardiology follow up.

## 2016-11-24 ENCOUNTER — Other Ambulatory Visit (INDEPENDENT_AMBULATORY_CARE_PROVIDER_SITE_OTHER): Payer: Managed Care, Other (non HMO)

## 2016-11-24 DIAGNOSIS — E785 Hyperlipidemia, unspecified: Secondary | ICD-10-CM

## 2016-11-24 DIAGNOSIS — E119 Type 2 diabetes mellitus without complications: Secondary | ICD-10-CM | POA: Diagnosis not present

## 2016-11-24 LAB — LIPID PANEL
CHOLESTEROL: 102 mg/dL (ref 0–200)
HDL: 26.3 mg/dL — ABNORMAL LOW (ref >39.00)
NonHDL: 75.98
Total CHOL/HDL Ratio: 4
Triglycerides: 251 mg/dL — ABNORMAL HIGH (ref 0.0–149.0)
VLDL: 50.2 mg/dL — ABNORMAL HIGH (ref 0.0–40.0)

## 2016-11-24 LAB — LDL CHOLESTEROL, DIRECT: LDL DIRECT: 46 mg/dL

## 2016-11-24 LAB — COMPREHENSIVE METABOLIC PANEL
ALK PHOS: 68 U/L (ref 39–117)
ALT: 17 U/L (ref 0–53)
AST: 11 U/L (ref 0–37)
Albumin: 4.2 g/dL (ref 3.5–5.2)
BILIRUBIN TOTAL: 0.7 mg/dL (ref 0.2–1.2)
BUN: 20 mg/dL (ref 6–23)
CALCIUM: 9.5 mg/dL (ref 8.4–10.5)
CO2: 26 meq/L (ref 19–32)
CREATININE: 1.13 mg/dL (ref 0.40–1.50)
Chloride: 104 mEq/L (ref 96–112)
GFR: 72.67 mL/min (ref >60.00)
Glucose, Bld: 153 mg/dL — ABNORMAL HIGH (ref 70–99)
Potassium: 4.2 mEq/L (ref 3.5–5.1)
Sodium: 137 mEq/L (ref 135–145)
TOTAL PROTEIN: 6.9 g/dL (ref 6.0–8.3)

## 2016-11-24 LAB — HEMOGLOBIN A1C: HEMOGLOBIN A1C: 6.9 % — AB (ref 4.6–6.5)

## 2016-11-28 ENCOUNTER — Encounter: Payer: Self-pay | Admitting: Primary Care

## 2016-12-27 ENCOUNTER — Other Ambulatory Visit: Payer: Self-pay | Admitting: Primary Care

## 2016-12-27 DIAGNOSIS — IMO0001 Reserved for inherently not codable concepts without codable children: Secondary | ICD-10-CM

## 2016-12-27 DIAGNOSIS — E1165 Type 2 diabetes mellitus with hyperglycemia: Principal | ICD-10-CM

## 2017-03-01 ENCOUNTER — Other Ambulatory Visit: Payer: Self-pay | Admitting: Cardiovascular Disease

## 2017-03-01 DIAGNOSIS — E785 Hyperlipidemia, unspecified: Secondary | ICD-10-CM

## 2017-03-25 ENCOUNTER — Other Ambulatory Visit: Payer: Self-pay | Admitting: Cardiovascular Disease

## 2017-03-25 DIAGNOSIS — I10 Essential (primary) hypertension: Secondary | ICD-10-CM

## 2017-03-27 NOTE — Progress Notes (Signed)
Cardiology Office Note  Date:  03/28/2017   ID:  Dennis Curry, DOB 04-Jul-1966, MRN [id number]  PCP:  [name], NP   Chief Complaint  Patient presents with  . other    12 month follow up. Patient states he is doing well. Meds reviewed verbally with patient.     HPI:  51 year old gentleman with PMH of DM II Nonischemic cardiomyopathy EF 25% , up to 45 to 50% in 2016, 50 to 55%  In 2017 No CAD on cardiac cath, echocardiogram 2016 shows ejection fraction has improved up to 45-50%. 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   TEE 2015 4.5 aorta ascending Aorta Root 4.4  Echo TTE 2016  ascendingaorta 4.5  Nonsmoker No chest pain on exertion No shortness of breath, orthopnea, PND, leg swelling  no complaints. stable  No shortness of breath, no chest pain, no leg swelling, no change in his weight He does report rare episodes of lightheadedness consistent with orthostasis Overall no complaints  EKG personally reviewed by myself on todays visit Shows normal sinus rhythm with rate 73 bpm left anterior fascicular block   Initially developed progressive chest congestion initially felt to be allergies, treated with several rounds of ABX,  presenting to the hospital with worsening shortness of breath found to have acute systolic CHF, EF 78%, dilated ascending aorta, cardiac catheterization showing no significant CAD, aorta 4.5 cm on TEE,  PMH:   has a past medical history of Aortic insufficiency; Ascending aorta dilatation (HCC); Chronic  systolic CHF (congestive heart failure) (HCC); Diabetes mellitus without complication (HCC); Hyperlipidemia; Hypertension; and Nonischemic dilated cardiomyopathy (HCC).  PSH:    Past Surgical History:  Procedure Laterality Date  . CARDIAC CATHETERIZATION N/A 12/02/2014   Procedure: Left Heart Cath and Coronary Angiography;  Surgeon: Antonieta Iba, MD;  Location: [facility] INVASIVE CV LAB;  Service: Cardiovascular;  Laterality: N/A;  . 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 CORONARY ANGIOPLASTY    . OTHER SURGICAL HISTORY     none    Current Outpatient Prescriptions  Medication Sig Dispense Refill  . aspirin 81 MG tablet Take 81 mg by mouth daily.    Marland Kitchen atorvastatin (LIPITOR) 40 MG tablet TAKE 1 TABLET BY MOUTH AT BEDTIME. 90 tablet 3  . carvedilol (COREG) 6.25 MG tablet TAKE 1 TABLET BY MOUTH 2 TIMES DAILY WITH A MEAL. 90 tablet 0  . CIALIS 5 MG tablet TAKE 1 TABLET (5 MG TOTAL) BY MOUTH DAILY AS NEEDED FOR ERECTILE DYSFUNCTION. 10 tablet 0  . furosemide (LASIX) 40 MG tablet TAKE 1 TABLET (40 MG TOTAL) BY MOUTH 2 (TWO) TIMES DAILY. 180 tablet 3  . glipiZIDE (GLUCOTROL XL) 5 MG 24 hr tablet TAKE 1 TABLET BY MOUTH DAILY WITH BREAKFAST. 90 tablet 1  . losartan (COZAAR) 100 MG tablet TAKE 1 TABLET BY MOUTH EVERY DAY 90 tablet 3  . metFORMIN (GLUCOPHAGE) 1000 MG tablet TAKE 1 TABLET BY MOUTH TWICE A DAY WITHA MEAL 180 tablet 1  . spironolactone (ALDACTONE) 25 MG tablet  TAKE 1 TABLET BY MOUTH 2 TIMES DAILY. 180 tablet 3   Current Facility-Administered Medications  Medication Dose Route Frequency Provider Last Rate Last Dose  . 0.9 %  sodium chloride infusion  500 mL Intravenous Continuous Iva Boop, MD       Facility-Administered Medications Ordered in Other Visits  Medication Dose Route Frequency Provider Last Rate Last Dose  . acetaminophen (TYLENOL) tablet 650 mg  650 mg Oral Q4H PRN Antonieta Iba, MD         Allergies:   Patient has no known  allergies.   Social History:  The patient  reports that he has never smoked. He has never used smokeless tobacco. He reports that he does not drink alcohol or use drugs.   Family History:   family history includes Coronary artery disease in his mother; Diabetes Mellitus II in his paternal uncle; Hypertension in his father.    Review of Systems: Review of Systems  Constitutional: Negative.   Respiratory: Negative.   Cardiovascular: Negative.   Gastrointestinal: Negative.   Musculoskeletal: Negative.   Neurological: Negative.   Psychiatric/Behavioral: Negative.   All other systems reviewed and are negative.    PHYSICAL EXAM: VS:  BP (!) 143/79 (BP Location: Left Arm, Patient Position: Sitting, Cuff Size: Normal)   Pulse 73   Ht 6' (1.829 m)   Wt 226 lb (102.5 kg)   BMI 30.65 kg/m  , BMI Body mass index is 30.65 kg/m.  GEN: Well nourished, well developed, in no acute distress  HEENT: normal  Neck: no JVD, carotid bruits, or masses Cardiac: RRR; no murmurs, rubs, or gallops,no edema  Respiratory:  clear to auscultation bilaterally, normal work of breathing GI: soft, nontender, nondistended, + BS MS: no deformity or atrophy  Skin: warm and dry, no rash Neuro:  Strength and sensation are intact Psych: euthymic mood, full affect    Recent Labs: 05/23/2016: TSH 2.76 11/24/2016: ALT 17; BUN 20; Creatinine, Ser 1.13; Potassium 4.2; Sodium 137    Lipid Panel Lab Results  Component Value Date   CHOL 102 11/24/2016   HDL 26.30 (L) 11/24/2016   TRIG 251.0 (H) 11/24/2016      Wt Readings from Last 3 Encounters:  03/28/17 226 lb (102.5 kg)  11/20/16 228 lb 12.8 oz (103.8 kg)  04/26/16 234 lb (106.1 kg)       ASSESSMENT AND PLAN:  Ascending aorta dilation (HCC) - Plan: ECHOCARDIOGRAM COMPLETE Previous results from 2015, 2016, 2017 reviewed with him He would like to repeat echocardiogram 2019 given insurance issues Stable 4.5 cm  Acute systolic CHF (congestive  heart failure) (HCC) -  Feels better taking Lasix once a day He'll continue his other medications, ejection fraction 50-55% last year 2017  Aortic valve disorder - Plan: EKG 12-Lead, ECHOCARDIOGRAM COMPLETE Moderate regurgitation noted on previous echocardiogram Repeat echocardiogram 2019  Essential hypertension - Plan: EKG 12-Lead, ECHOCARDIOGRAM COMPLETE Blood pressure is well controlled on today's visit. No changes made to the medications.  Nonischemic dilated cardiomyopathy (HCC) - Plan: EKG 12-Lead, ECHOCARDIOGRAM COMPLETE Dramatic improvement in ejection fraction over the past several years, clinically stable, no medication changes made  Aneurysm, ascending aorta (HCC) Echocardiogram ordered as above Long discussion with him today He prefers repeat imaging 2019   Total encounter time more than 25 minutes  Greater than 50% was spent in counseling and coordination of care with the patient   Disposition:   F/U  12 months   No orders of the defined types were  placed in this encounter.    Signed, Dossie Arbour, M.D., Ph.D. 03/28/2017  [facility] [place], [place] [phone]

## 2017-03-28 ENCOUNTER — Encounter: Payer: Self-pay | Admitting: Cardiovascular Disease

## 2017-03-28 ENCOUNTER — Ambulatory Visit (INDEPENDENT_AMBULATORY_CARE_PROVIDER_SITE_OTHER): Payer: Managed Care, Other (non HMO) | Admitting: Cardiovascular Disease

## 2017-03-28 ENCOUNTER — Other Ambulatory Visit: Payer: Self-pay

## 2017-03-28 VITALS — BP 143/79 | HR 73 | Ht 72.0 in | Wt 226.0 lb

## 2017-03-28 DIAGNOSIS — I5022 Chronic systolic (congestive) heart failure: Secondary | ICD-10-CM

## 2017-03-28 DIAGNOSIS — I7121 Aneurysm of the ascending aorta, without rupture: Secondary | ICD-10-CM

## 2017-03-28 DIAGNOSIS — E782 Mixed hyperlipidemia: Secondary | ICD-10-CM

## 2017-03-28 DIAGNOSIS — I10 Essential (primary) hypertension: Secondary | ICD-10-CM

## 2017-03-28 DIAGNOSIS — E1159 Type 2 diabetes mellitus with other circulatory complications: Secondary | ICD-10-CM

## 2017-03-28 DIAGNOSIS — I420 Dilated cardiomyopathy: Secondary | ICD-10-CM | POA: Diagnosis not present

## 2017-03-28 DIAGNOSIS — I712 Thoracic aortic aneurysm, without rupture: Secondary | ICD-10-CM

## 2017-03-28 NOTE — Patient Instructions (Addendum)
Medication Instructions:   No medication changes made  Labwork:  No new labs needed  Testing/Procedures:  Consider echo 03/2018 for dilated ascending aorta, hx of cardiomyopathy   Follow-Up: It was a pleasure seeing you in the office today. Please call us if you have new issues that need to be addressed before your next appt.  [phone]  Your physician wants you to follow-up in: 12 months.  You will receive a reminder letter in the mail two months in advance. If you don't receive a letter, please call our office to schedule the follow-up appointment.  If you need a refill on your cardiac medications before your next appointment, please call your pharmacy.

## 2017-04-24 ENCOUNTER — Ambulatory Visit (INDEPENDENT_AMBULATORY_CARE_PROVIDER_SITE_OTHER): Payer: Managed Care, Other (non HMO)

## 2017-04-24 ENCOUNTER — Other Ambulatory Visit: Payer: Self-pay

## 2017-04-24 DIAGNOSIS — I7121 Aneurysm of the ascending aorta, without rupture: Secondary | ICD-10-CM

## 2017-04-24 DIAGNOSIS — I712 Thoracic aortic aneurysm, without rupture: Secondary | ICD-10-CM | POA: Diagnosis not present

## 2017-04-30 ENCOUNTER — Telehealth: Payer: Self-pay | Admitting: *Deleted

## 2017-04-30 DIAGNOSIS — I7121 Aneurysm of the ascending aorta, without rupture: Secondary | ICD-10-CM

## 2017-04-30 DIAGNOSIS — I712 Thoracic aortic aneurysm, without rupture: Secondary | ICD-10-CM

## 2017-04-30 NOTE — Telephone Encounter (Signed)
Reviewed results and recommendations with patient and he verbalized understanding with no further questions at this time. Order placed for repeat echocardiogram in 1 year.

## 2017-04-30 NOTE — Telephone Encounter (Signed)
-----   Message from Antonieta Ibaimothy J Gollan, MD sent at 04/27/2017  6:59 PM EDT ----- Echocardiogram result Essentially the same size ascending aorta Unable to exclude very slight progression over the past several years Now 4.7 cm, previously measured 4.5 cm Would recommend echocardiogram in 1 year to follow

## 2017-05-02 ENCOUNTER — Other Ambulatory Visit: Payer: Self-pay | Admitting: Cardiovascular Disease

## 2017-05-02 ENCOUNTER — Other Ambulatory Visit: Payer: Self-pay | Admitting: Primary Care

## 2017-05-02 DIAGNOSIS — I10 Essential (primary) hypertension: Secondary | ICD-10-CM

## 2017-05-02 DIAGNOSIS — N529 Male erectile dysfunction, unspecified: Secondary | ICD-10-CM

## 2017-05-08 ENCOUNTER — Other Ambulatory Visit: Payer: Self-pay | Admitting: Cardiovascular Disease

## 2017-05-08 DIAGNOSIS — I10 Essential (primary) hypertension: Secondary | ICD-10-CM

## 2017-05-14 ENCOUNTER — Other Ambulatory Visit: Payer: Self-pay | Admitting: Cardiovascular Disease

## 2017-05-14 DIAGNOSIS — I10 Essential (primary) hypertension: Secondary | ICD-10-CM

## 2017-05-22 ENCOUNTER — Encounter: Payer: Self-pay | Admitting: Primary Care

## 2017-05-22 DIAGNOSIS — E041 Nontoxic single thyroid nodule: Secondary | ICD-10-CM

## 2017-05-29 ENCOUNTER — Ambulatory Visit: Payer: Managed Care, Other (non HMO)

## 2017-06-05 ENCOUNTER — Ambulatory Visit
Admission: RE | Admit: 2017-06-05 | Discharge: 2017-06-05 | Disposition: A | Discharge status: Home / Self Care | Payer: Managed Care, Other (non HMO) | Source: Ambulatory Visit | Attending: Primary Care | Admitting: Primary Care

## 2017-06-05 DIAGNOSIS — E041 Nontoxic single thyroid nodule: Secondary | ICD-10-CM | POA: Diagnosis present

## 2017-06-22 ENCOUNTER — Other Ambulatory Visit: Payer: Self-pay | Admitting: Primary Care

## 2017-06-22 DIAGNOSIS — E1165 Type 2 diabetes mellitus with hyperglycemia: Principal | ICD-10-CM

## 2017-06-22 DIAGNOSIS — IMO0001 Reserved for inherently not codable concepts without codable children: Secondary | ICD-10-CM

## 2017-09-17 ENCOUNTER — Other Ambulatory Visit: Payer: Self-pay | Admitting: Cardiovascular Disease

## 2017-09-17 NOTE — Telephone Encounter (Signed)
Pharmacy requesting Furosemide 40MG  BID.  Per Dr. Windell HummingbirdGollan's last note "Feels better taking Lasix once a day" Please advise which instructions patient should follow.

## 2017-10-09 ENCOUNTER — Other Ambulatory Visit: Payer: Self-pay | Admitting: Primary Care

## 2017-10-09 DIAGNOSIS — E118 Type 2 diabetes mellitus with unspecified complications: Secondary | ICD-10-CM

## 2017-10-09 DIAGNOSIS — E785 Hyperlipidemia, unspecified: Secondary | ICD-10-CM

## 2017-10-09 DIAGNOSIS — Z125 Encounter for screening for malignant neoplasm of prostate: Secondary | ICD-10-CM

## 2017-10-09 DIAGNOSIS — I10 Essential (primary) hypertension: Secondary | ICD-10-CM

## 2017-10-17 ENCOUNTER — Other Ambulatory Visit (INDEPENDENT_AMBULATORY_CARE_PROVIDER_SITE_OTHER): Payer: Managed Care, Other (non HMO)

## 2017-10-17 DIAGNOSIS — E785 Hyperlipidemia, unspecified: Secondary | ICD-10-CM | POA: Diagnosis not present

## 2017-10-17 DIAGNOSIS — I10 Essential (primary) hypertension: Secondary | ICD-10-CM

## 2017-10-17 DIAGNOSIS — Z125 Encounter for screening for malignant neoplasm of prostate: Secondary | ICD-10-CM | POA: Diagnosis not present

## 2017-10-17 DIAGNOSIS — E118 Type 2 diabetes mellitus with unspecified complications: Secondary | ICD-10-CM

## 2017-10-17 LAB — COMPREHENSIVE METABOLIC PANEL
ALBUMIN: 4.2 g/dL (ref 3.5–5.2)
ALT: 23 U/L (ref 0–53)
AST: 14 U/L (ref 0–37)
Alkaline Phosphatase: 65 U/L (ref 39–117)
BUN: 19 mg/dL (ref 6–23)
CHLORIDE: 101 meq/L (ref 96–112)
CO2: 27 meq/L (ref 19–32)
CREATININE: 1.1 mg/dL (ref 0.40–1.50)
Calcium: 9.3 mg/dL (ref 8.4–10.5)
GFR: 74.7 mL/min (ref >60.00)
Glucose, Bld: 176 mg/dL — ABNORMAL HIGH (ref 70–99)
Potassium: 4.1 mEq/L (ref 3.5–5.1)
SODIUM: 137 meq/L (ref 135–145)
Total Bilirubin: 0.7 mg/dL (ref 0.2–1.2)
Total Protein: 6.8 g/dL (ref 6.0–8.3)

## 2017-10-17 LAB — LIPID PANEL
CHOL/HDL RATIO: 5
CHOLESTEROL: 132 mg/dL (ref 0–200)
HDL: 25.7 mg/dL — AB (ref >39.00)

## 2017-10-17 LAB — HEMOGLOBIN A1C: Hgb A1c MFr Bld: 7.7 % — ABNORMAL HIGH (ref 4.6–6.5)

## 2017-10-17 LAB — LDL CHOLESTEROL, DIRECT: LDL DIRECT: 50 mg/dL

## 2017-10-17 LAB — PSA: PSA: 0.25 ng/mL (ref 0.10–4.00)

## 2017-10-22 ENCOUNTER — Encounter: Payer: Self-pay | Admitting: Primary Care

## 2017-10-22 ENCOUNTER — Encounter (INDEPENDENT_AMBULATORY_CARE_PROVIDER_SITE_OTHER): Payer: Self-pay

## 2017-10-22 ENCOUNTER — Other Ambulatory Visit: Payer: Self-pay | Admitting: Primary Care

## 2017-10-22 ENCOUNTER — Ambulatory Visit (INDEPENDENT_AMBULATORY_CARE_PROVIDER_SITE_OTHER): Payer: Managed Care, Other (non HMO) | Admitting: Primary Care

## 2017-10-22 VITALS — BP 122/78 | HR 82 | Temp 98.7°F | Ht 72.0 in | Wt 228.8 lb

## 2017-10-22 DIAGNOSIS — I5022 Chronic systolic (congestive) heart failure: Secondary | ICD-10-CM | POA: Diagnosis not present

## 2017-10-22 DIAGNOSIS — E782 Mixed hyperlipidemia: Secondary | ICD-10-CM

## 2017-10-22 DIAGNOSIS — I10 Essential (primary) hypertension: Secondary | ICD-10-CM

## 2017-10-22 DIAGNOSIS — Z0000 Encounter for general adult medical examination without abnormal findings: Secondary | ICD-10-CM

## 2017-10-22 DIAGNOSIS — E119 Type 2 diabetes mellitus without complications: Secondary | ICD-10-CM

## 2017-10-22 DIAGNOSIS — E1159 Type 2 diabetes mellitus with other circulatory complications: Secondary | ICD-10-CM | POA: Diagnosis not present

## 2017-10-22 DIAGNOSIS — Z23 Encounter for immunization: Secondary | ICD-10-CM

## 2017-10-22 NOTE — Assessment & Plan Note (Addendum)
Td due, he declines today. Pneumonia vaccination provided today. PSA UTD. Colonoscopy UTD.  Discussed the importance of a healthy diet and regular exercise in order for weight loss, and to reduce the risk of any potential medical problems. Exam unremarkable. Labs with uncontrolled triglycerides, increased A1C. Repeat labs in 3 months. Follow up in 1 year for CPE, 6 months for diabetes check.

## 2017-10-22 NOTE — Addendum Note (Signed)
Addended by: Tawnya CrookSAMBATH, Avon Molock on: 10/22/2017 12:45 PM   Modules accepted: Orders

## 2017-10-22 NOTE — Assessment & Plan Note (Signed)
Following with cardiology annually, continue carvedilol and furosemide. Recent BMP unremarkable.

## 2017-10-22 NOTE — Assessment & Plan Note (Signed)
Increased to 7.7 from 6.9, likely diet related. Discussed to limit bread, stop processed snacks and to start exercising.  Managed on ARB and statin. Pneumonia vaccination provided today. Foot exam unremarkable. Discussed to schedule annual eye exam.  Will not make medication adjustments today as he would like to work on diet. Will have him follow up in 6 months for re-evaluation and if no improvement then will need to consider dose increase and/or additional medication.

## 2017-10-22 NOTE — Patient Instructions (Addendum)
You were provided with a pneumonia vaccination.   Start exercising. You should be getting 150 minutes of moderate intensity exercise weekly.  Start working on M.D.[employer]. Holdingsyour diet as discussed. Increase vegetables, fruit, whole grains, lean protein. Stop eating chips, junk food, and limit bread. Limit juice as this contains a lot of sugar.  Continue your current diabetes medications.  Start Fish Oil 1000 mg three times daily with meals.   Please schedule a follow up appointment in 6 months for diabetes.   It was a pleasure to see you today!   Diabetes Mellitus and Nutrition When you have diabetes (diabetes mellitus), it is very important to have healthy eating habits because your blood sugar (glucose) levels are greatly affected by what you eat and drink. Eating healthy foods in the appropriate amounts, at about the same times every day, can help you:  Control your blood glucose.  Lower your risk of heart disease.  Improve your blood pressure.  Reach or maintain a healthy weight.  Every person with diabetes is different, and each person has different needs for a meal plan. Your health care provider may recommend that you work with a diet and nutrition specialist (dietitian) to make a meal plan that is best for you. Your meal plan may vary depending on factors such as:  The calories you need.  The medicines you take.  Your weight.  Your blood glucose, blood pressure, and cholesterol levels.  Your activity level.  Other health conditions you have, such as heart or kidney disease.  How do carbohydrates affect me? Carbohydrates affect your blood glucose level more than any other type of food. Eating carbohydrates naturally increases the amount of glucose in your blood. Carbohydrate counting is a method for keeping track of how many carbohydrates you eat. Counting carbohydrates is important to keep your blood glucose at a healthy level, especially if you use insulin or take certain oral  diabetes medicines. It is important to know how many carbohydrates you can safely have in each meal. This is different for every person. Your dietitian can help you calculate how many carbohydrates you should have at each meal and for snack. Foods that contain carbohydrates include:  Bread, cereal, rice, pasta, and crackers.  Potatoes and corn.  Peas, beans, and lentils.  Milk and yogurt.  Fruit and juice.  Desserts, such as cakes, cookies, ice cream, and candy.  How does alcohol affect me? Alcohol can cause a sudden decrease in blood glucose (hypoglycemia), especially if you use insulin or take certain oral diabetes medicines. Hypoglycemia can be a life-threatening condition. Symptoms of hypoglycemia (sleepiness, dizziness, and confusion) are similar to symptoms of having too much alcohol. If your health care provider says that alcohol is safe for you, follow these guidelines:  Limit alcohol intake to no more than 1 drink per day for nonpregnant women and 2 drinks per day for men. One drink equals 12 oz of beer, 5 oz of wine, or 1 oz of hard liquor.  Do not drink on an empty stomach.  Keep yourself hydrated with water, diet soda, or unsweetened iced tea.  Keep in mind that regular soda, juice, and other mixers may contain a lot of sugar and must be counted as carbohydrates.  What are tips for following this plan? Reading food labels  Start by checking the serving size on the label. The amount of calories, carbohydrates, fats, and other nutrients listed on the label are based on one serving of the food. Many foods contain  more than one serving per package.  Check the total grams (g) of carbohydrates in one serving. You can calculate the number of servings of carbohydrates in one serving by dividing the total carbohydrates by 15. For example, if a food has 30 g of total carbohydrates, it would be equal to 2 servings of carbohydrates.  Check the number of grams (g) of saturated and  trans fats in one serving. Choose foods that have low or no amount of these fats.  Check the number of milligrams (mg) of sodium in one serving. Most people should limit total sodium intake to less than 2,300 mg per day.  Always check the nutrition information of foods labeled as "low-fat" or "nonfat". These foods may be higher in added sugar or refined carbohydrates and should be avoided.  Talk to your dietitian to identify your daily goals for nutrients listed on the label. Shopping  Avoid buying canned, premade, or processed foods. These foods tend to be high in fat, sodium, and added sugar.  Shop around the outside edge of the grocery store. This includes fresh fruits and vegetables, bulk grains, fresh meats, and fresh dairy. Cooking  Use low-heat cooking methods, such as baking, instead of high-heat cooking methods like deep frying.  Cook using healthy oils, such as olive, canola, or sunflower oil.  Avoid cooking with butter, cream, or high-fat meats. Meal planning  Eat meals and snacks regularly, preferably at the same times every day. Avoid going long periods of time without eating.  Eat foods high in fiber, such as fresh fruits, vegetables, beans, and whole grains. Talk to your dietitian about how many servings of carbohydrates you can eat at each meal.  Eat 4-6 ounces of lean protein each day, such as lean meat, chicken, fish, eggs, or tofu. 1 ounce is equal to 1 ounce of meat, chicken, or fish, 1 egg, or 1/4 cup of tofu.  Eat some foods each day that contain healthy fats, such as avocado, nuts, seeds, and fish. Lifestyle   Check your blood glucose regularly.  Exercise at least 30 minutes 5 or more days each week, or as told by your health care provider.  Take medicines as told by your health care provider.  Do not use any products that contain nicotine or tobacco, such as cigarettes and e-cigarettes. If you need help quitting, ask your health care provider.  Work with  a Social worker or diabetes educator to identify strategies to manage stress and any emotional and social challenges. What are some questions to ask my health care provider?  Do I need to meet with a diabetes educator?  Do I need to meet with a dietitian?  What number can I call if I have questions?  When are the best times to check my blood glucose? Where to find more information:  American Diabetes Association: diabetes.org/food-and-fitness/food  Academy of Nutrition and Dietetics: PokerClues.dk  [employer] of Diabetes and Digestive and Kidney Diseases (NIH): ContactWire.be Summary  A healthy meal plan will help you control your blood glucose and maintain a healthy lifestyle.  Working with a diet and nutrition specialist (dietitian) can help you make a meal plan that is best for you.  Keep in mind that carbohydrates and alcohol have immediate effects on your blood glucose levels. It is important to count carbohydrates and to use alcohol carefully. This information is not intended to replace advice given to you by your health care provider. Make sure you discuss any questions you have with your health  care provider. Document Released: 03/23/2005 Document Revised: 07/31/2016 Document Reviewed: 07/31/2016 Elsevier Interactive Patient Education  [employer]2018 Elsevier Inc.

## 2017-10-22 NOTE — Assessment & Plan Note (Signed)
Stable in the office today, continue losartan and carvedilol.  

## 2017-10-22 NOTE — Progress Notes (Signed)
Subjective:    Patient ID: Dennis Curry, male    DOB: Jun 01, 1966, 52 y.o.   MRN: [id number]  HPI  Dennis Curry is a 52 year old male who presents today for complete physical.  Immunizations: -Tetanus: Due today -Pneumonia: Due today, never completed   Diet: He endorses a poor diet for which he resumed in November 2018.  Breakfast: Toast, eggs Lunch: Bread, sandwich, chips Dinner: Bread, pasta, chicken, potatoes, fast food on the weekends Snacks: Cheese Its, fruit Desserts: Occasionally  Beverages: Water, diet soda, apple juice   Exercise: He is not exercising.  Eye exam: Completed in February 2018 Dental exam: Completes annually Colonoscopy: Completed in 2017 PSA: Negative  Wt Readings from Last 3 Encounters:  10/22/17 228 lb 12 oz (103.8 kg)  03/28/17 226 lb (102.5 kg)  11/20/16 228 lb 12.8 oz (103.8 kg)      Review of Systems  Constitutional: Negative for unexpected weight change.  HENT: Negative for rhinorrhea.   Respiratory: Negative for cough and shortness of breath.   Cardiovascular: Negative for chest pain.  Gastrointestinal: Negative for constipation and diarrhea.  Genitourinary: Negative for difficulty urinating.  Musculoskeletal: Negative for arthralgias and myalgias.  Skin: Negative for rash.  Allergic/Immunologic: Negative for environmental allergies.  Neurological: Negative for dizziness, numbness and headaches.  Psychiatric/Behavioral: The patient is not nervous/anxious.        Past Medical History:  Diagnosis Date  . Aortic insufficiency    a. TTE 11/30/14: EF 25-30%, bicupid valve cannot be excluded, mod-severe aortic regurge, aortic root dimension 52 mm, mild MR, mildly dilated LA/RA, PASP 60; TEE 12/01/14: EF 25-30%, diffuse HK, aortic valve trileaflet, mild to mod Ao regurge, ascending aortic grossly measured at 4.5 cm (not well visualized),   . Ascending aorta dilatation (HCC)    a. 52 mm by TTE 11/2014; b. 45 mm by TEE 11/2014 (not well  visualized)  . Chronic systolic CHF (congestive heart failure) (HCC)    a. TTE 11/30/14: EF 25-30%, bicupid valve cannot be excluded, mod-severe aortic regurge, aortic root dimension 52 mm, mild MR, mildly dilated LA/RA, PASP 60; TEE 12/01/14: EF 25-30%, diffuse HK, aortic valve trileaflet, mild to mod Ao regurge, ascending aortic grossly measured at 4.5 cm (not well visualized),   . Diabetes mellitus without complication (HCC)   . Hyperlipidemia   . Hypertension   . Nonischemic dilated cardiomyopathy (HCC)    a. cath 12/02/14: right dom coronary system w/ mild lumenal irregs, o/w no sig CAD, sev diffuse HK EF 25%, no sig MR or AS, med Rx recommeded      Social History   Socioeconomic History  . Marital status: Married    Spouse name: Not on file  . Number of children: Not on file  . Years of education: Not on file  . Highest education level: Not on file  Occupational History  . Not on file  Social Needs  . Financial resource strain: Not on file  . Food insecurity:    Worry: Not on file    Inability: Not on file  . Transportation needs:    Medical: Not on file    Non-medical: Not on file  Tobacco Use  . Smoking status: Never Smoker  . Smokeless tobacco: Never Used  Substance and Sexual Activity  . Alcohol use: No    Alcohol/week: 0.0 oz  . Drug use: No  . Sexual activity: Not on file  Lifestyle  . Physical activity:    Days per week: Not on  file    Minutes per session: Not on file  . Stress: Not on file  Relationships  . Social connections:    Talks on phone: Not on file    Gets together: Not on file    Attends religious service: Not on file    Active member of club or organization: Not on file    Attends meetings of clubs or organizations: Not on file    Relationship status: Not on file  . Intimate partner violence:    Fear of current or ex partner: Not on file    Emotionally abused: Not on file    Physically abused: Not on file    Forced sexual activity: Not on  file  Other Topics Concern  . Not on file  Social History Narrative   Married.   Works as a Engineer, structural at [employer].   No children.   Enjoys playing guitar, traveling, spending time with his wife.    Past Surgical History:  Procedure Laterality Date  . CARDIAC CATHETERIZATION N/A 12/02/2014   Procedure: Left Heart Cath and Coronary Angiography;  Surgeon: Antonieta Iba, MD;  Location: [facility] INVASIVE CV LAB;  Service: Cardiovascular;  Laterality: N/A;  . 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 CORONARY ANGIOPLASTY    . OTHER SURGICAL HISTORY     none    Family History  Problem Relation Age of Onset  . Coronary artery disease Mother   . Hypertension Father   . Diabetes Mellitus II Paternal Uncle   . Colon cancer Neg Hx     No Known Allergies  Current Outpatient Medications on File Prior to Visit  Medication Sig Dispense Refill  . aspirin 81 MG tablet Take 81 mg by mouth daily.    Marland Kitchen atorvastatin (LIPITOR) 40 MG tablet TAKE 1 TABLET BY MOUTH AT BEDTIME. 90 tablet 3  . carvedilol (COREG) 6.25 MG tablet TAKE 1 TABLET BY MOUTH 2 TIMES DAILY WITH A MEAL. 180 tablet 3  . CIALIS 5 MG tablet TAKE 1 TABLET (5 MG TOTAL) BY MOUTH DAILY AS NEEDED FOR ERECTILE DYSFUNCTION. 10 tablet 2  . furosemide (LASIX) 40 MG tablet TAKE 1 TABLET (40 MG TOTAL) BY MOUTH 2 (TWO) TIMES DAILY. 180 tablet 2  . glipiZIDE (GLUCOTROL XL) 5 MG 24 hr tablet TAKE 1 TABLET BY MOUTH DAILY WITH BREAKFAST. 90 tablet 1  . losartan (COZAAR) 100 MG tablet TAKE 1 TABLET BY MOUTH EVERY DAY 90 tablet 3  . metFORMIN (GLUCOPHAGE) 1000 MG tablet TAKE 1 TABLET BY MOUTH TWICE A DAY WITHA MEAL 180 tablet 1  . spironolactone (ALDACTONE) 25 MG tablet TAKE 1 TABLET BY MOUTH 2 TIMES DAILY. 180 tablet 3   Current Facility-Administered Medications on File Prior to Visit  Medication Dose Route Frequency Provider Last Rate Last Dose  . 0.9 %  sodium chloride infusion  500 mL Intravenous Continuous Iva Boop, MD      . acetaminophen (TYLENOL) tablet 650 mg  650 mg Oral Q4H PRN Gollan, Tollie Pizza, MD        BP 122/78   Pulse 82   Temp 98.7 F (37.1 C) (Oral)   Ht 6' (1.829 m)   Wt 228 lb 12 oz (103.8 kg)   SpO2 97%   BMI 31.02 kg/m    Objective:   Physical Exam  Constitutional: He is oriented to person, place, and time. He appears well-nourished.  HENT:  Right Ear: Tympanic membrane and  ear canal normal.  Left Ear: Tympanic membrane and ear canal normal.  Nose: Nose normal. Right sinus exhibits no maxillary sinus tenderness and no frontal sinus tenderness. Left sinus exhibits no maxillary sinus tenderness and no frontal sinus tenderness.  Mouth/Throat: Oropharynx is clear and moist.  Eyes: Pupils are equal, round, and reactive to light. Conjunctivae and EOM are normal.  Neck: Neck supple. Carotid bruit is not present. No thyromegaly present.  Cardiovascular: Normal rate, regular rhythm and normal heart sounds.  Pulmonary/Chest: Effort normal and breath sounds normal. He has no wheezes. He has no rales.  Abdominal: Soft. Bowel sounds are normal. There is no tenderness.  Musculoskeletal: Normal range of motion.  Neurological: He is alert and oriented to person, place, and time. He has normal reflexes. No cranial nerve deficit.  Skin: Skin is warm and dry.  Psychiatric: He has a normal mood and affect.          Assessment & Plan:

## 2017-10-22 NOTE — Assessment & Plan Note (Signed)
LDL at goal. Triglyceride elevation likely secondary to diet and lack of exercise. Discussed to improve diet and start exercising. Add Fish Oil 1000 mg TID with meals. Recheck lipids in 3 months.

## 2017-12-10 ENCOUNTER — Other Ambulatory Visit: Payer: Self-pay | Admitting: Primary Care

## 2017-12-10 DIAGNOSIS — E1165 Type 2 diabetes mellitus with hyperglycemia: Principal | ICD-10-CM

## 2017-12-10 DIAGNOSIS — IMO0001 Reserved for inherently not codable concepts without codable children: Secondary | ICD-10-CM

## 2018-01-14 ENCOUNTER — Other Ambulatory Visit: Payer: Self-pay | Admitting: Primary Care

## 2018-01-14 DIAGNOSIS — N529 Male erectile dysfunction, unspecified: Secondary | ICD-10-CM

## 2018-01-15 ENCOUNTER — Other Ambulatory Visit (INDEPENDENT_AMBULATORY_CARE_PROVIDER_SITE_OTHER): Payer: Managed Care, Other (non HMO)

## 2018-01-15 DIAGNOSIS — E119 Type 2 diabetes mellitus without complications: Secondary | ICD-10-CM | POA: Diagnosis not present

## 2018-01-15 DIAGNOSIS — E782 Mixed hyperlipidemia: Secondary | ICD-10-CM

## 2018-01-15 LAB — LIPID PANEL
CHOLESTEROL: 136 mg/dL (ref 0–200)
HDL: 28.2 mg/dL — ABNORMAL LOW (ref >39.00)
Total CHOL/HDL Ratio: 5
Triglycerides: 455 mg/dL — ABNORMAL HIGH (ref 0.0–149.0)

## 2018-01-15 LAB — HEMOGLOBIN A1C: Hgb A1c MFr Bld: 7.3 % — ABNORMAL HIGH (ref 4.6–6.5)

## 2018-01-15 LAB — LDL CHOLESTEROL, DIRECT: LDL DIRECT: 58 mg/dL

## 2018-02-02 ENCOUNTER — Other Ambulatory Visit: Payer: Self-pay | Admitting: Cardiovascular Disease

## 2018-02-02 DIAGNOSIS — E785 Hyperlipidemia, unspecified: Secondary | ICD-10-CM

## 2018-02-27 ENCOUNTER — Other Ambulatory Visit: Payer: Self-pay | Admitting: Primary Care

## 2018-02-27 DIAGNOSIS — E1165 Type 2 diabetes mellitus with hyperglycemia: Principal | ICD-10-CM

## 2018-02-27 DIAGNOSIS — IMO0001 Reserved for inherently not codable concepts without codable children: Secondary | ICD-10-CM

## 2018-03-01 ENCOUNTER — Other Ambulatory Visit: Payer: Self-pay | Admitting: Primary Care

## 2018-03-01 DIAGNOSIS — N529 Male erectile dysfunction, unspecified: Secondary | ICD-10-CM

## 2018-03-25 NOTE — Progress Notes (Signed)
Cardiology Office Note  Date:  03/28/2018   ID:  Dennis Curry, DOB [birth year], MRN [id number]  PCP:  Doreene Nestlark, Katherine K, NP   Chief Complaint  Patient presents with  . other    12 month f/u no complaints today. Meds reviewed verbally with pt.    HPI:  52 year old gentleman with PMH of DM II Nonischemic cardiomyopathy EF 25% , up to 45 to 50% in 2016, 50 to 55%  In 2017 No CAD on cardiac cath, aorta 4.5 cm on TEE, dilated ascending aorta and aortic root  who presents for routine follow-up of his systolic CHF and dilated ascending aorta  Feels well with no complaints Reports having one episode of indigestion, went away without intervention No regular exercise program Weight high but stable  Denies any symptoms for heart failure No leg edema, PND orthopnea  Lab work reviewed with him in detail LDL 46 HBA1C 6.9 Total Chol 102  Previous imaging reviewed in detail, CT chest 04/2016  Coronary calcium three vessel, 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   TEE 2015 4.5 aorta ascending Aorta Root 4.4  Echo TTE 2016  Ascending  aorta 4.5  Nonsmoker No chest pain on exertion No shortness of breath, orthopnea, PND, leg swelling  EKG personally reviewed by myself on todays visit Shows normal sinus rhythm with rate 68 bpm left anterior fascicular block Poor R-wave progression to the anterior precordial leads   Initially developed progressive chest congestion initially felt to be allergies, treated with several rounds of ABX,  presenting to the hospital with worsening shortness of breath found to have acute systolic CHF, EF 78%25%, dilated ascending aorta, cardiac catheterization showing no significant CAD, aorta 4.5 cm on TEE,  PMH:   has a past medical history of Aortic insufficiency, Ascending aorta dilatation (HCC), Chronic systolic CHF (congestive heart failure) (HCC), Diabetes mellitus without complication (HCC), Hyperlipidemia, Hypertension, and Nonischemic  dilated cardiomyopathy (HCC).  PSH:    Past Surgical History:  Procedure Laterality Date  . CARDIAC CATHETERIZATION N/A 12/02/2014   Procedure: Left Heart Cath and Coronary Angiography;  Surgeon: Antonieta Ibaimothy J Sheyann Sulton, MD;  Location: [facility] INVASIVE CV LAB;  Service: Cardiovascular;  Laterality: N/A;  . 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 CORONARY ANGIOPLASTY    . OTHER SURGICAL HISTORY     none    Current Outpatient Medications  Medication Sig Dispense Refill  . aspirin 81 MG tablet Take 81 mg by mouth daily.    Marland Kitchen. atorvastatin (LIPITOR) 40 MG tablet TAKE 1 TABLET BY MOUTH AT BEDTIME. 90 tablet 3  . carvedilol (COREG) 6.25 MG tablet TAKE 1 TABLET BY MOUTH 2 TIMES DAILY WITH A MEAL. 180 tablet 3  . furosemide (LASIX) 40 MG tablet TAKE 1 TABLET (40 MG TOTAL) BY MOUTH 2 (TWO) TIMES DAILY. (Patient taking differently: Take 40 mg by mouth daily. ) 180 tablet 2  . glipiZIDE (GLUCOTROL XL) 5 MG 24 hr tablet TAKE 1 TABLET BY MOUTH DAILY WITH BREAKFAST. 90 tablet 0  . losartan (COZAAR) 100 MG tablet TAKE 1 TABLET BY MOUTH EVERY DAY 90 tablet 3  . metFORMIN (GLUCOPHAGE) 1000 MG tablet TAKE 1 TABLET BY MOUTH TWICE A DAY WITHA MEAL 180 tablet 0  . spironolactone (ALDACTONE) 25 MG tablet TAKE 1 TABLET BY MOUTH 2 TIMES DAILY. 180 tablet 3  . tadalafil (CIALIS) 5 MG tablet TAKE 1 TABLET (5 MG TOTAL) BY MOUTH DAILY AS NEEDED FOR ERECTILE DYSFUNCTION. 10  tablet 2   Current Facility-Administered Medications  Medication Dose Route Frequency Provider Last Rate Last Dose  . 0.9 %  sodium chloride infusion  500 mL Intravenous Continuous Iva Boop, MD       Facility-Administered Medications Ordered in Other Visits  Medication Dose Route Frequency Provider Last Rate Last Dose  . acetaminophen (TYLENOL) tablet 650 mg  650 mg Oral Q4H PRN Antonieta Iba, MD         Allergies:   Patient has no known allergies.   Social History:  The patient  reports that he has never smoked.  He has never used smokeless tobacco. He reports that he does not drink alcohol or use drugs.   Family History:   family history includes Coronary artery disease in his mother; Diabetes Mellitus II in his paternal uncle; Hypertension in his father.    Review of Systems: Review of Systems  Constitutional: Negative.   Respiratory: Negative.   Cardiovascular: Negative.   Gastrointestinal: Negative.   Musculoskeletal: Negative.   Neurological: Negative.   Psychiatric/Behavioral: Negative.   All other systems reviewed and are negative.    PHYSICAL EXAM: VS:  BP 110/60 (BP Location: Left Arm, Patient Position: Sitting, Cuff Size: Normal)   Pulse 68   Ht 6' (1.829 m)   Wt 228 lb (103.4 kg)   BMI 30.92 kg/m  , BMI Body mass index is 30.92 kg/m.  Constitutional:  oriented to person, place, and time. No distress.  HENT:  Head: Normocephalic and atraumatic.  Eyes:  no discharge. No scleral icterus.  Neck: Normal range of motion. Neck supple. No JVD present.  Cardiovascular: Normal rate, regular rhythm, normal heart sounds and intact distal pulses. Exam reveals no gallop and no friction rub. No edema No murmur heard. Pulmonary/Chest: Effort normal and breath sounds normal. No stridor. No respiratory distress.  no wheezes.  no rales.  no tenderness.  Abdominal: Soft.  no distension.  no tenderness.  Musculoskeletal: Normal range of motion.  no  tenderness or deformity.  Neurological:  normal muscle tone. Coordination normal. No atrophy Skin: Skin is warm and dry. No rash noted. not diaphoretic.  Psychiatric:  normal mood and affect. behavior is normal. Thought content normal.    Recent Labs: 10/17/2017: ALT 23; BUN 19; Creatinine, Ser 1.10; Potassium 4.1; Sodium 137    Lipid Panel Lab Results  Component Value Date   CHOL 136 01/15/2018   HDL 28.20 (L) 01/15/2018   TRIG (H) 01/15/2018    455.0 Triglyceride is over 400; calculations on Lipids are invalid.      Wt Readings from  Last 3 Encounters:  03/28/18 228 lb (103.4 kg)  10/22/17 228 lb 12 oz (103.8 kg)  03/28/17 226 lb (102.5 kg)       ASSESSMENT AND PLAN:  Ascending aorta dilation (HCC) - Plan: ECHOCARDIOGRAM COMPLETE Previous results from 2015, 2016, 2017 2018 reviewed He would like to repeat echocardiogram 2019  echocardiogram has been ordered Stable 4.5 cm up to 4.7  Acute systolic CHF (congestive heart failure) (HCC) -  Feels better taking Lasix once a day  ejection fraction 50-55% last year 2018 No medication changes made  Aortic valve disorder - Repeat echocardiogram  Order placed Felt to be trileaflet on transesophageal echo  Essential hypertension - Plan: EKG 12-Lead, ECHOCARDIOGRAM COMPLETE Blood pressure is well controlled on today's visit. No changes made to the medications.  Nonischemic dilated cardiomyopathy (HCC) -  Dramatic improvement in ejection fraction over the past several years, clinically  stable, no medication changes made stable  Aneurysm, ascending aorta (HCC) Echocardiogram ordered as above Long discussion with him today    Total encounter time more than 25 minutes  Greater than 50% was spent in counseling and coordination of care with the patient   Disposition:   F/U  12 months   Orders Placed This Encounter  Procedures  . EKG 12-Lead  . ECHOCARDIOGRAM COMPLETE     Signed, Dossie Arbour, M.D., Ph.D. 03/28/2018  [facility] [place], [place] [phone]

## 2018-03-28 ENCOUNTER — Ambulatory Visit (INDEPENDENT_AMBULATORY_CARE_PROVIDER_SITE_OTHER): Payer: Managed Care, Other (non HMO) | Admitting: Cardiovascular Disease

## 2018-03-28 ENCOUNTER — Encounter: Payer: Self-pay | Admitting: Cardiovascular Disease

## 2018-03-28 VITALS — BP 110/60 | HR 68 | Ht 72.0 in | Wt 228.0 lb

## 2018-03-28 DIAGNOSIS — E1159 Type 2 diabetes mellitus with other circulatory complications: Secondary | ICD-10-CM | POA: Diagnosis not present

## 2018-03-28 DIAGNOSIS — I359 Nonrheumatic aortic valve disorder, unspecified: Secondary | ICD-10-CM

## 2018-03-28 DIAGNOSIS — I10 Essential (primary) hypertension: Secondary | ICD-10-CM

## 2018-03-28 DIAGNOSIS — I5022 Chronic systolic (congestive) heart failure: Secondary | ICD-10-CM | POA: Diagnosis not present

## 2018-03-28 DIAGNOSIS — E782 Mixed hyperlipidemia: Secondary | ICD-10-CM

## 2018-03-28 DIAGNOSIS — I420 Dilated cardiomyopathy: Secondary | ICD-10-CM

## 2018-03-28 DIAGNOSIS — I719 Aortic aneurysm of unspecified site, without rupture: Secondary | ICD-10-CM

## 2018-03-28 NOTE — Patient Instructions (Addendum)
Medication Instructions:   No medication changes made  Labwork:  No new labs needed  Testing/Procedures:  We will order an echocardiogram for dilatated aorta (ascending) cardiomyopathy  Follow-Up: It was a pleasure seeing you in the office today. Please call us if you have new issues that need to be addressed before your next appt.  [phone]  Your physician wants you to follow-up in: 12 months.  You will receive a reminder letter in the mail two months in advance. If you don't receive a letter, please call our office to schedule the follow-up appointment.  If you need a refill on your cardiac medications before your next appointment, please call your pharmacy.  For educational health videos Log in to : [email or web address] Or : [email or web address], password : [email or web address]

## 2018-03-30 ENCOUNTER — Other Ambulatory Visit: Payer: Self-pay | Admitting: Primary Care

## 2018-03-30 DIAGNOSIS — N529 Male erectile dysfunction, unspecified: Secondary | ICD-10-CM

## 2018-04-09 ENCOUNTER — Ambulatory Visit (INDEPENDENT_AMBULATORY_CARE_PROVIDER_SITE_OTHER): Payer: Managed Care, Other (non HMO)

## 2018-04-09 ENCOUNTER — Other Ambulatory Visit: Payer: Self-pay

## 2018-04-09 DIAGNOSIS — I719 Aortic aneurysm of unspecified site, without rupture: Secondary | ICD-10-CM

## 2018-04-09 DIAGNOSIS — I420 Dilated cardiomyopathy: Secondary | ICD-10-CM

## 2018-04-10 ENCOUNTER — Other Ambulatory Visit: Payer: Self-pay | Admitting: Cardiovascular Disease

## 2018-04-10 DIAGNOSIS — I10 Essential (primary) hypertension: Secondary | ICD-10-CM

## 2018-04-16 ENCOUNTER — Telehealth: Payer: Self-pay | Admitting: *Deleted

## 2018-04-16 DIAGNOSIS — I719 Aortic aneurysm of unspecified site, without rupture: Secondary | ICD-10-CM

## 2018-04-16 NOTE — Telephone Encounter (Signed)
Spoke with patient and reviewed results and recommendations from echocardiogram. He verbalized understanding and was agreeable to have repeat imaging in order to assess aorta. Provided him with phone number to call and schedule this test and he verbalized understanding to please give me a call back if he should have any further questions.

## 2018-04-16 NOTE — Telephone Encounter (Signed)
Left voicemail message to call back  

## 2018-04-16 NOTE — Telephone Encounter (Signed)
Patient returning call.

## 2018-04-16 NOTE — Telephone Encounter (Signed)
-----   Message from Antonieta Iba, MD sent at 04/13/2018 10:57 PM EDT ----- Echo EF 50 to 55%, stable numbers The aorta shows signs of possible worsening dilation, difficult to tell Would recommend a CT scan chest with contrast to visualize the aorta We have not performed a baseline CT, and need to probably have one for the future

## 2018-04-24 ENCOUNTER — Encounter: Payer: Self-pay | Admitting: Primary Care

## 2018-04-24 ENCOUNTER — Ambulatory Visit (INDEPENDENT_AMBULATORY_CARE_PROVIDER_SITE_OTHER): Payer: Managed Care, Other (non HMO) | Admitting: Primary Care

## 2018-04-24 VITALS — BP 112/60 | HR 64 | Temp 98.0°F | Ht 72.0 in | Wt 222.0 lb

## 2018-04-24 DIAGNOSIS — E1159 Type 2 diabetes mellitus with other circulatory complications: Secondary | ICD-10-CM | POA: Diagnosis not present

## 2018-04-24 LAB — POCT GLYCOSYLATED HEMOGLOBIN (HGB A1C): HEMOGLOBIN A1C: 6.6 % — AB (ref 4.0–5.6)

## 2018-04-24 NOTE — Assessment & Plan Note (Signed)
Compliant to current regimen and tolerating well. Commended him on a healthy diet, discussed to start with regular exercise.  Pneumonia vaccination UTD. Eye and foot exams UTD. Managed on ARB and statin. Repeat A1C pending.  Follow up in 6 months.

## 2018-04-24 NOTE — Patient Instructions (Addendum)
Continue to work on a healthy diet.  Start exercising. You should be getting 150 minutes of moderate intensity exercise weekly.  Stop by the lab prior to leaving today. I will notify you of your results once received.   Please schedule a physical with me in April 2020. You may also schedule a lab only appointment 3-4 days prior. We will discuss your lab results in detail during your physical.  It was a pleasure to see you today!

## 2018-04-24 NOTE — Progress Notes (Signed)
Subjective:    Patient ID: Dennis Curry, male    DOB: 07-Jun-1966, 52 y.o.   MRN: [id number]  HPI  Dennis Curry is a 52 year old male who presents today for follow up of diabetes.  Current medications include: Metformin 1000 mg BID, glipizide ER 5 mg  He is checking his blood glucose every 2 weeks and is getting readings of: AM fasting: 120  Last A1C: 7.3 in July 2019 Last Eye Exam: Completed in June 2019 Last Foot Exam: Completed in April 2019 Pneumonia Vaccination: Completed in 2019 ACE/ARB: Losartan Statin: Lipitor  Diet currently consists of:  Breakfast: Egg, toast Lunch: Salad Dinner: Chicken, hamburgers, corn, peas, potatoes  Snacks: Almonds, grapes Desserts: None Beverages: diet green tea, vitamin water, water, diet soda  Exercise: He is not exercising but does walk a more at work    Review of Systems  Respiratory: Negative for shortness of breath.   Cardiovascular: Negative for chest pain.  Neurological: Negative for dizziness and numbness.       Past Medical History:  Diagnosis Date  . Aortic insufficiency    a. TTE 11/30/14: EF 25-30%, bicupid valve cannot be excluded, mod-severe aortic regurge, aortic root dimension 52 mm, mild MR, mildly dilated LA/RA, PASP 60; TEE 12/01/14: EF 25-30%, diffuse HK, aortic valve trileaflet, mild to mod Ao regurge, ascending aortic grossly measured at 4.5 cm (not well visualized),   . Ascending aorta dilatation (HCC)    a. 52 mm by TTE 11/2014; b. 45 mm by TEE 11/2014 (not well visualized)  . Chronic systolic CHF (congestive heart failure) (HCC)    a. TTE 11/30/14: EF 25-30%, bicupid valve cannot be excluded, mod-severe aortic regurge, aortic root dimension 52 mm, mild MR, mildly dilated LA/RA, PASP 60; TEE 12/01/14: EF 25-30%, diffuse HK, aortic valve trileaflet, mild to mod Ao regurge, ascending aortic grossly measured at 4.5 cm (not well visualized),   . Diabetes mellitus without complication (HCC)   . Hyperlipidemia   .  Hypertension   . Nonischemic dilated cardiomyopathy (HCC)    a. cath 12/02/14: right dom coronary system w/ mild lumenal irregs, o/w no sig CAD, sev diffuse HK EF 25%, no sig MR or AS, med Rx recommeded      Social History   Socioeconomic History  . Marital status: Married    Spouse name: Not on file  . Number of children: Not on file  . Years of education: Not on file  . Highest education level: Not on file  Occupational History  . Not on file  Social Needs  . Financial resource strain: Not on file  . Food insecurity:    Worry: Not on file    Inability: Not on file  . Transportation needs:    Medical: Not on file    Non-medical: Not on file  Tobacco Use  . Smoking status: Never Smoker  . Smokeless tobacco: Never Used  Substance and Sexual Activity  . Alcohol use: No    Alcohol/week: 0.0 standard drinks  . Drug use: No  . Sexual activity: Not on file  Lifestyle  . Physical activity:    Days per week: Not on file    Minutes per session: Not on file  . Stress: Not on file  Relationships  . Social connections:    Talks on phone: Not on file    Gets together: Not on file    Attends religious service: Not on file    Active member of club or organization: Not  on file    Attends meetings of clubs or organizations: Not on file    Relationship status: Not on file  . Intimate partner violence:    Fear of current or ex partner: Not on file    Emotionally abused: Not on file    Physically abused: Not on file    Forced sexual activity: Not on file  Other Topics Concern  . Not on file  Social History Narrative   Married.   Works as a Engineer, structural at [employer].   No children.   Enjoys playing guitar, traveling, spending time with his wife.    Past Surgical History:  Procedure Laterality Date  . CARDIAC CATHETERIZATION N/A 12/02/2014   Procedure: Left Heart Cath and Coronary Angiography;  Surgeon: Antonieta Iba, MD;  Location: [facility] INVASIVE CV [facility];  Service:  Cardiovascular;  Laterality: N/A;  . 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 CORONARY ANGIOPLASTY    . OTHER SURGICAL HISTORY     none    Family History  Problem Relation Age of Onset  . Coronary artery disease Mother   . Hypertension Father   . Diabetes Mellitus II Paternal Uncle   . Colon cancer Neg Hx     No Known Allergies  Current Outpatient Medications on File Prior to Visit  Medication Sig Dispense Refill  . aspirin 81 MG tablet Take 81 mg by mouth daily.    Marland Kitchen atorvastatin (LIPITOR) 40 MG tablet TAKE 1 TABLET BY MOUTH AT BEDTIME. 90 tablet 3  . carvedilol (COREG) 6.25 MG tablet TAKE 1 TABLET BY MOUTH 2 TIMES DAILY WITH A MEAL. 180 tablet 3  . furosemide (LASIX) 40 MG tablet TAKE 1 TABLET (40 MG TOTAL) BY MOUTH 2 (TWO) TIMES DAILY. (Patient taking differently: Take 40 mg by mouth daily. ) 180 tablet 2  . glipiZIDE (GLUCOTROL XL) 5 MG 24 hr tablet TAKE 1 TABLET BY MOUTH DAILY WITH BREAKFAST. 90 tablet 0  . losartan (COZAAR) 100 MG tablet TAKE 1 TABLET BY MOUTH EVERY DAY 90 tablet 3  . metFORMIN (GLUCOPHAGE) 1000 MG tablet TAKE 1 TABLET BY MOUTH TWICE A DAY WITHA MEAL 180 tablet 0  . spironolactone (ALDACTONE) 25 MG tablet TAKE 1 TABLET BY MOUTH 2 TIMES DAILY. 180 tablet 3  . tadalafil (CIALIS) 5 MG tablet TAKE 1 TABLET (5 MG TOTAL) BY MOUTH DAILY AS NEEDED FOR ERECTILE DYSFUNCTION. 10 tablet 2   Current Facility-Administered Medications on File Prior to Visit  Medication Dose Route Frequency Provider Last Rate Last Dose  . 0.9 %  sodium chloride infusion  500 mL Intravenous Continuous Iva Boop, MD      . acetaminophen (TYLENOL) tablet 650 mg  650 mg Oral Q4H PRN Gollan, Tollie Pizza, MD        BP 112/60   Pulse 64   Temp 98 F (36.7 C) (Oral)   Ht 6' (1.829 m)   Wt 222 lb (100.7 kg)   SpO2 97%   BMI 30.11 kg/m    Objective:   Physical Exam  Constitutional: He appears well-nourished.  Neck: Neck supple.  Cardiovascular: Normal rate  and regular rhythm.  Respiratory: Effort normal and breath sounds normal.  Skin: Skin is warm and dry.           Assessment & Plan:

## 2018-05-02 ENCOUNTER — Ambulatory Visit
Admission: RE | Admit: 2018-05-02 | Discharge: 2018-05-02 | Disposition: A | Discharge status: Home / Self Care | Payer: Managed Care, Other (non HMO) | Source: Ambulatory Visit | Attending: Cardiovascular Disease | Admitting: Cardiovascular Disease

## 2018-05-02 DIAGNOSIS — N62 Hypertrophy of breast: Secondary | ICD-10-CM | POA: Insufficient documentation

## 2018-05-02 DIAGNOSIS — N6489 Other specified disorders of breast: Secondary | ICD-10-CM | POA: Insufficient documentation

## 2018-05-02 DIAGNOSIS — E041 Nontoxic single thyroid nodule: Secondary | ICD-10-CM | POA: Diagnosis not present

## 2018-05-02 DIAGNOSIS — I719 Aortic aneurysm of unspecified site, without rupture: Secondary | ICD-10-CM

## 2018-05-02 DIAGNOSIS — I2510 Atherosclerotic heart disease of native coronary artery without angina pectoris: Secondary | ICD-10-CM | POA: Diagnosis not present

## 2018-05-02 LAB — POCT I-STAT CREATININE: Creatinine, Ser: 1.1 mg/dL (ref 0.61–1.24)

## 2018-05-02 MED ORDER — IOPAMIDOL (ISOVUE-370) INJECTION 76%
75.0000 mL | Freq: Once | INTRAVENOUS | Status: AC | PRN
  Administered 2018-05-02: 75 mL via INTRAVENOUS

## 2018-05-16 ENCOUNTER — Other Ambulatory Visit: Payer: Self-pay | Admitting: Primary Care

## 2018-05-16 ENCOUNTER — Other Ambulatory Visit: Payer: Self-pay | Admitting: Cardiovascular Disease

## 2018-05-16 DIAGNOSIS — IMO0001 Reserved for inherently not codable concepts without codable children: Secondary | ICD-10-CM

## 2018-05-16 DIAGNOSIS — E1165 Type 2 diabetes mellitus with hyperglycemia: Principal | ICD-10-CM

## 2018-05-25 ENCOUNTER — Other Ambulatory Visit: Payer: Self-pay | Admitting: Cardiovascular Disease

## 2018-06-12 ENCOUNTER — Other Ambulatory Visit: Payer: Self-pay | Admitting: Primary Care

## 2018-06-12 ENCOUNTER — Other Ambulatory Visit: Payer: Self-pay | Admitting: Cardiovascular Disease

## 2018-06-12 DIAGNOSIS — N529 Male erectile dysfunction, unspecified: Secondary | ICD-10-CM

## 2018-06-29 ENCOUNTER — Other Ambulatory Visit: Payer: Self-pay | Admitting: Primary Care

## 2018-06-29 DIAGNOSIS — N529 Male erectile dysfunction, unspecified: Secondary | ICD-10-CM

## 2018-08-31 ENCOUNTER — Other Ambulatory Visit: Payer: Self-pay | Admitting: Primary Care

## 2018-08-31 DIAGNOSIS — N529 Male erectile dysfunction, unspecified: Secondary | ICD-10-CM

## 2018-09-02 ENCOUNTER — Encounter: Payer: Self-pay | Admitting: Family Medicine

## 2018-09-02 ENCOUNTER — Ambulatory Visit (INDEPENDENT_AMBULATORY_CARE_PROVIDER_SITE_OTHER): Payer: Managed Care, Other (non HMO) | Admitting: Family Medicine

## 2018-09-02 VITALS — BP 112/66 | HR 99 | Temp 100.3°F | Ht 72.0 in | Wt 230.1 lb

## 2018-09-02 DIAGNOSIS — J101 Influenza due to other identified influenza virus with other respiratory manifestations: Secondary | ICD-10-CM

## 2018-09-02 DIAGNOSIS — R509 Fever, unspecified: Secondary | ICD-10-CM

## 2018-09-02 HISTORY — DX: Influenza due to other identified influenza virus with other respiratory manifestations: J10.1

## 2018-09-02 LAB — POC INFLUENZA A&B (BINAX/QUICKVUE)
Influenza A, POC: POSITIVE — AB
Influenza B, POC: NEGATIVE

## 2018-09-02 MED ORDER — OSELTAMIVIR PHOSPHATE 75 MG PO CAPS: 75.0000 mg | ORAL_CAPSULE | Freq: Two times a day (BID) | ORAL | 0 refills | Status: DC

## 2018-09-02 NOTE — Assessment & Plan Note (Signed)
With cough and fever (wife was diagnosed yesterday as well)  tamiflu px -bid for 5 d Fluids/rest Expectorant with DM for cough prn  Tylenol or nsaid for fever Antihistamine for runny nose and drip  No work until better Handout given

## 2018-09-02 NOTE — Progress Notes (Signed)
Subjective:    Patient ID: Dennis Curry, male    DOB: 18-Nov-1965, 53 y.o.   MRN: [id number]  HPI  53 yo pt of NP Clark here with cough and nasal congestion  100.3 temp this am   Started last night ST Cough - phlegm/clear No wheezing  Fever -around 99  No ear pain  Aches all over  Also had the chills last night   His wife was recently diagnosed with the flu   Taking vit D and echiacea   Results for orders placed or performed in visit on 09/02/18  POC Influenza A&B(BINAX/QUICKVUE)  Result Value Ref Range   Influenza A, POC Positive (A) Negative   Influenza B, POC Negative Negative     Patient Active Problem List   Diagnosis Date Noted  . Influenza A 09/02/2018  . Hyperlipidemia 09/08/2015  . Preventative health care 09/08/2015  . Hypokalemia 01/18/2015  . Chronic systolic heart failure (HCC) 12/18/2014  . Diabetes (HCC) 12/02/2014  . Nonischemic dilated cardiomyopathy (HCC)   . Aortic valve disorder   . Aneurysm, ascending aorta (HCC)   . Essential hypertension 11/30/2014   Past Medical History:  Diagnosis Date  . Aortic insufficiency    a. TTE 11/30/14: EF 25-30%, bicupid valve cannot be excluded, mod-severe aortic regurge, aortic root dimension 52 mm, mild MR, mildly dilated LA/RA, PASP 60; TEE 12/01/14: EF 25-30%, diffuse HK, aortic valve trileaflet, mild to mod Ao regurge, ascending aortic grossly measured at 4.5 cm (not well visualized),   . Ascending aorta dilatation (HCC)    a. 52 mm by TTE 11/2014; b. 45 mm by TEE 11/2014 (not well visualized)  . Chronic systolic CHF (congestive heart failure) (HCC)    a. TTE 11/30/14: EF 25-30%, bicupid valve cannot be excluded, mod-severe aortic regurge, aortic root dimension 52 mm, mild MR, mildly dilated LA/RA, PASP 60; TEE 12/01/14: EF 25-30%, diffuse HK, aortic valve trileaflet, mild to mod Ao regurge, ascending aortic grossly measured at 4.5 cm (not well visualized),   . Diabetes mellitus without complication (HCC)   .  Hyperlipidemia   . Hypertension   . Nonischemic dilated cardiomyopathy (HCC)    a. cath 12/02/14: right dom coronary system w/ mild lumenal irregs, o/w no sig CAD, sev diffuse HK EF 25%, no sig MR or AS, med Rx recommeded    Past Surgical History:  Procedure Laterality Date  . CARDIAC CATHETERIZATION N/A 12/02/2014   Procedure: Left Heart Cath and Coronary Angiography;  Surgeon: Antonieta Iba, MD;  Location: [facility];  Service: Cardiovascular;  Laterality: N/A;  . 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 CORONARY ANGIOPLASTY    . OTHER SURGICAL HISTORY     none   Social History   Tobacco Use  . Smoking status: Never Smoker  . Smokeless tobacco: Never Used  Substance Use Topics  . Alcohol use: No    Alcohol/week: 0.0 standard drinks  . Drug use: No   Family History  Problem Relation Age of Onset  . Coronary artery disease Mother   . Hypertension Father   . Diabetes Mellitus II Paternal Uncle   . Colon cancer Neg Hx    No Known Allergies Current Outpatient Medications on File Prior to Visit  Medication Sig Dispense Refill  . aspirin 81 MG tablet Take 81 mg by mouth daily.    Marland Kitchen atorvastatin (LIPITOR) 40 MG tablet TAKE 1 TABLET BY MOUTH AT BEDTIME. 90 tablet 3  .  carvedilol (COREG) 6.25 MG tablet TAKE 1 TABLET BY MOUTH 2 TIMES DAILY WITH A MEAL. 180 tablet 3  . furosemide (LASIX) 40 MG tablet Take 1 tablet (40 mg total) by mouth daily. 30 tablet 6  . furosemide (LASIX) 40 MG tablet Take 1 tablet (40 mg total) by mouth daily. 180 tablet 2  . glipiZIDE (GLUCOTROL XL) 5 MG 24 hr tablet TAKE 1 TABLET BY MOUTH DAILY WITH BREAKFAST. 90 tablet 1  . losartan (COZAAR) 100 MG tablet TAKE 1 TABLET BY MOUTH EVERY DAY 90 tablet 3  . metFORMIN (GLUCOPHAGE) 1000 MG tablet TAKE 1 TABLET BY MOUTH TWICE A DAY WITHA MEAL 180 tablet 1  . spironolactone (ALDACTONE) 25 MG tablet TAKE 1 TABLET BY MOUTH 2 TIMES DAILY. 180 tablet 3  . tadalafil (CIALIS) 5 MG  tablet TAKE 1 TABLET BY MOUTH DAILY AS NEEDED FOR ERECTILE DYSFUNCTION 8 tablet 1   Current Facility-Administered Medications on File Prior to Visit  Medication Dose Route Frequency Provider Last Rate Last Dose  . acetaminophen (TYLENOL) tablet 650 mg  650 mg Oral Q4H PRN Antonieta Iba, MD        Review of Systems  Constitutional: Positive for appetite change, fatigue and fever.  HENT: Positive for congestion, postnasal drip, rhinorrhea, sinus pressure, sneezing and sore throat. Negative for ear pain.   Eyes: Negative for pain and discharge.  Respiratory: Positive for cough. Negative for shortness of breath, wheezing and stridor.   Cardiovascular: Negative for chest pain.  Gastrointestinal: Negative for diarrhea, nausea and vomiting.  Genitourinary: Negative for frequency, hematuria and urgency.  Musculoskeletal: Negative for arthralgias and myalgias.  Skin: Negative for rash.  Neurological: Positive for headaches. Negative for dizziness, weakness and light-headedness.  Psychiatric/Behavioral: Negative for confusion and dysphoric mood.       Objective:   Physical Exam Constitutional:      General: He is not in acute distress.    Appearance: Normal appearance. He is well-developed. He is obese. He is not ill-appearing, toxic-appearing or diaphoretic.  HENT:     Head: Normocephalic and atraumatic.     Comments: Nares are injected and congested      Right Ear: Tympanic membrane, ear canal and external ear normal.     Left Ear: Tympanic membrane, ear canal and external ear normal.     Ears:     Comments: Partial cerumen imp bilat     Nose: Congestion and rhinorrhea present.     Mouth/Throat:     Mouth: Mucous membranes are moist.     Pharynx: Oropharynx is clear. No oropharyngeal exudate or posterior oropharyngeal erythema.     Comments: Clear pnd  Eyes:     General:        Right eye: No discharge.        Left eye: No discharge.     Conjunctiva/sclera: Conjunctivae normal.       Pupils: Pupils are equal, round, and reactive to light.  Neck:     Musculoskeletal: Normal range of motion and neck supple.  Cardiovascular:     Rate and Rhythm: Tachycardia present.     Heart sounds: Normal heart sounds.  Pulmonary:     Effort: Pulmonary effort is normal. No respiratory distress.     Breath sounds: Normal breath sounds. No stridor. No wheezing, rhonchi or rales.     Comments: Good air exch No rales or rhonchi  No wheeze Dry sounding cough  Chest:     Chest wall: No tenderness.  Lymphadenopathy:  Cervical: No cervical adenopathy.  Skin:    General: Skin is warm and dry.     Capillary Refill: Capillary refill takes less than 2 seconds.     Findings: No rash.  Neurological:     Mental Status: He is alert.     Cranial Nerves: No cranial nerve deficit.  Psychiatric:        Mood and Affect: Mood normal.           Assessment & Plan:   Problem List Items Addressed This Visit      Respiratory   Influenza A    With cough and fever (wife was diagnosed yesterday as well)  tamiflu px -bid for 5 d Fluids/rest Expectorant with DM for cough prn  Tylenol or nsaid for fever Antihistamine for runny nose and drip  No work until better Handout given      Relevant Medications   oseltamivir (TAMIFLU) 75 MG capsule    Other Visit Diagnoses    Fever, unspecified fever cause    -  Primary   Relevant Orders   POC Influenza A&B(BINAX/QUICKVUE) (Completed)

## 2018-09-02 NOTE — Patient Instructions (Addendum)
You have the flu  Take tamiflu as directed   Lots of fluids Lots of rest  No work until fever is down and you are feeling better   For fever- try tylenol or ibuprofen over the counter  For cough and congestion - mucinex DM  For runny nose - an antihistamine like zyrtec

## 2018-09-03 NOTE — Telephone Encounter (Signed)
Last prescribed on 06/11/2018. Last office visit on 09/02/2018 with Dr Milinda Antis for acute. No future appointment

## 2018-09-03 NOTE — Telephone Encounter (Signed)
Irving Burton, will you please notify patient that he's due for his CPE in April/May this year? Please schedule, thanks!

## 2018-09-08 ENCOUNTER — Other Ambulatory Visit: Payer: Self-pay | Admitting: Primary Care

## 2018-09-08 DIAGNOSIS — N529 Male erectile dysfunction, unspecified: Secondary | ICD-10-CM

## 2018-10-04 ENCOUNTER — Other Ambulatory Visit: Payer: Self-pay | Admitting: Primary Care

## 2018-10-04 DIAGNOSIS — N529 Male erectile dysfunction, unspecified: Secondary | ICD-10-CM

## 2018-10-05 ENCOUNTER — Other Ambulatory Visit: Payer: Self-pay | Admitting: Primary Care

## 2018-10-05 DIAGNOSIS — N529 Male erectile dysfunction, unspecified: Secondary | ICD-10-CM

## 2018-10-13 ENCOUNTER — Other Ambulatory Visit: Payer: Self-pay | Admitting: Primary Care

## 2018-10-13 DIAGNOSIS — N529 Male erectile dysfunction, unspecified: Secondary | ICD-10-CM

## 2018-10-15 NOTE — Telephone Encounter (Signed)
Last prescribed on 09/03/2018. Last office visit on 09/02/2018 with Dr Milinda Antis for acute. Next future appointment on 01/01/2019

## 2018-10-15 NOTE — Telephone Encounter (Signed)
Noted, refill sent to pharmacy. 

## 2018-10-23 DIAGNOSIS — E041 Nontoxic single thyroid nodule: Secondary | ICD-10-CM

## 2018-10-25 ENCOUNTER — Other Ambulatory Visit: Payer: Managed Care, Other (non HMO)

## 2018-10-28 ENCOUNTER — Encounter: Payer: Managed Care, Other (non HMO) | Admitting: Primary Care

## 2018-11-05 ENCOUNTER — Other Ambulatory Visit: Payer: Self-pay | Admitting: Primary Care

## 2018-11-05 DIAGNOSIS — IMO0001 Reserved for inherently not codable concepts without codable children: Secondary | ICD-10-CM

## 2018-11-05 DIAGNOSIS — E1165 Type 2 diabetes mellitus with hyperglycemia: Principal | ICD-10-CM

## 2018-11-23 ENCOUNTER — Other Ambulatory Visit: Payer: Self-pay | Admitting: Primary Care

## 2018-11-23 DIAGNOSIS — N529 Male erectile dysfunction, unspecified: Secondary | ICD-10-CM

## 2018-12-20 ENCOUNTER — Other Ambulatory Visit: Payer: Self-pay | Admitting: Primary Care

## 2018-12-20 DIAGNOSIS — I10 Essential (primary) hypertension: Secondary | ICD-10-CM

## 2018-12-20 DIAGNOSIS — E1159 Type 2 diabetes mellitus with other circulatory complications: Secondary | ICD-10-CM

## 2018-12-20 DIAGNOSIS — E782 Mixed hyperlipidemia: Secondary | ICD-10-CM

## 2018-12-25 ENCOUNTER — Other Ambulatory Visit (INDEPENDENT_AMBULATORY_CARE_PROVIDER_SITE_OTHER): Payer: Managed Care, Other (non HMO)

## 2018-12-25 ENCOUNTER — Other Ambulatory Visit: Payer: Self-pay

## 2018-12-25 DIAGNOSIS — E782 Mixed hyperlipidemia: Secondary | ICD-10-CM

## 2018-12-25 DIAGNOSIS — E1159 Type 2 diabetes mellitus with other circulatory complications: Secondary | ICD-10-CM | POA: Diagnosis not present

## 2018-12-25 DIAGNOSIS — I10 Essential (primary) hypertension: Secondary | ICD-10-CM

## 2018-12-25 LAB — COMPREHENSIVE METABOLIC PANEL
ALT: 25 U/L (ref 0–53)
AST: 14 U/L (ref 0–37)
Albumin: 4.4 g/dL (ref 3.5–5.2)
Alkaline Phosphatase: 66 U/L (ref 39–117)
BUN: 13 mg/dL (ref 6–23)
CO2: 29 mEq/L (ref 19–32)
Calcium: 9.4 mg/dL (ref 8.4–10.5)
Chloride: 102 mEq/L (ref 96–112)
Creatinine, Ser: 1 mg/dL (ref 0.40–1.50)
GFR: 78.1 mL/min (ref >60.00)
Glucose, Bld: 182 mg/dL — ABNORMAL HIGH (ref 70–99)
Potassium: 3.7 mEq/L (ref 3.5–5.1)
Sodium: 140 mEq/L (ref 135–145)
Total Bilirubin: 0.8 mg/dL (ref 0.2–1.2)
Total Protein: 6.7 g/dL (ref 6.0–8.3)

## 2018-12-25 LAB — HEMOGLOBIN A1C: Hgb A1c MFr Bld: 7.9 % — ABNORMAL HIGH (ref 4.6–6.5)

## 2018-12-25 LAB — LIPID PANEL
Cholesterol: 126 mg/dL (ref 0–200)
HDL: 27.9 mg/dL — ABNORMAL LOW (ref >39.00)
NonHDL: 98.22
Total CHOL/HDL Ratio: 5
Triglycerides: 302 mg/dL — ABNORMAL HIGH (ref 0.0–149.0)
VLDL: 60.4 mg/dL — ABNORMAL HIGH (ref 0.0–40.0)

## 2018-12-25 LAB — LDL CHOLESTEROL, DIRECT: Direct LDL: 67 mg/dL

## 2018-12-27 IMAGING — CT CT ANGIO CHEST
2 of 6 series · 13 of 36 positions shown · IV contrast (iopamidol)
Comparison: CTA of the chest on 05/03/2016 and report from
echocardiography on 04/09/2018.

CLINICAL DATA: Known aneurysmal disease of the aortic root and
ascending thoracic aorta. Possible increased caliber of the aortic
root by recent echocardiography.

EXAM:
CT ANGIOGRAPHY CHEST WITH CONTRAST
TECHNIQUE: Multidetector CT imaging of the chest was performed using the
standard protocol during bolus administration of intravenous
contrast. Multiplanar CT image reconstructions and MIPs were
obtained to evaluate the vascular anatomy.
CONTRAST:  75mL [id number] IOPAMIDOL ([id number]) INJECTION 76%

[Series 5: axial arterial cta thorax · axial · arterial · 0.66mm/px · z∈[-1312,-1026]mm · 12 of 171 slices shown]
[im 14/171  lung]
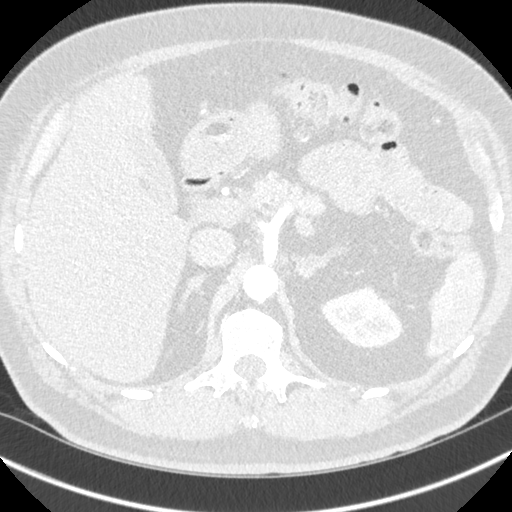
[im 27/171  mediastinal]
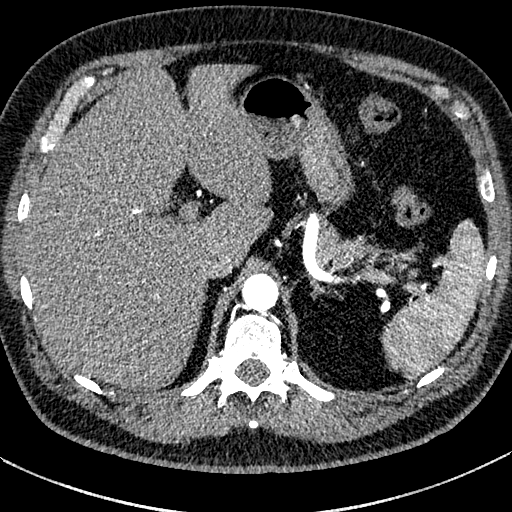
[im 40/171  lung]
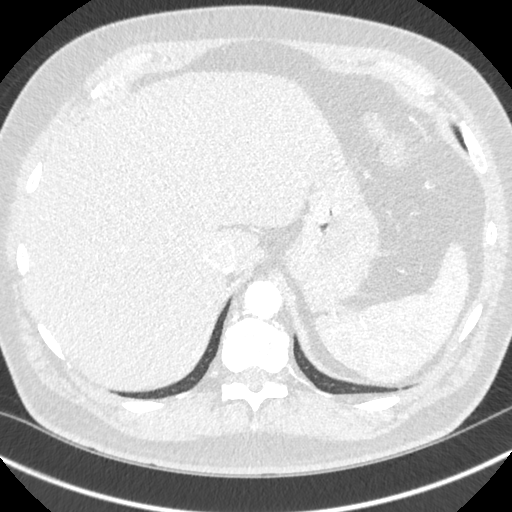
[im 53/171  mediastinal]
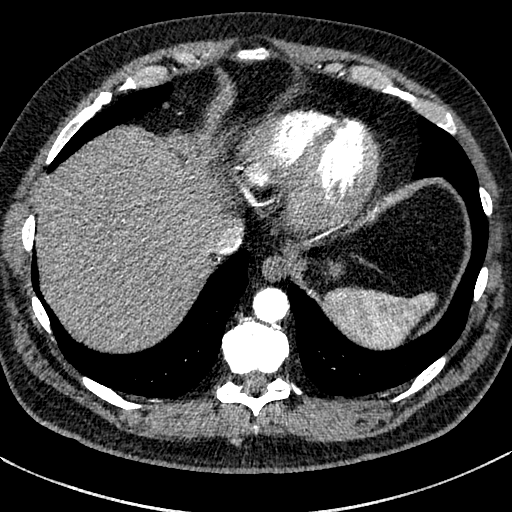
[im 66/171  lung]
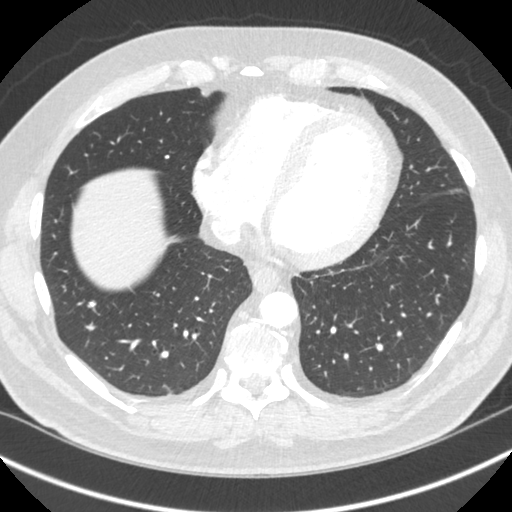
[im 79/171  mediastinal]
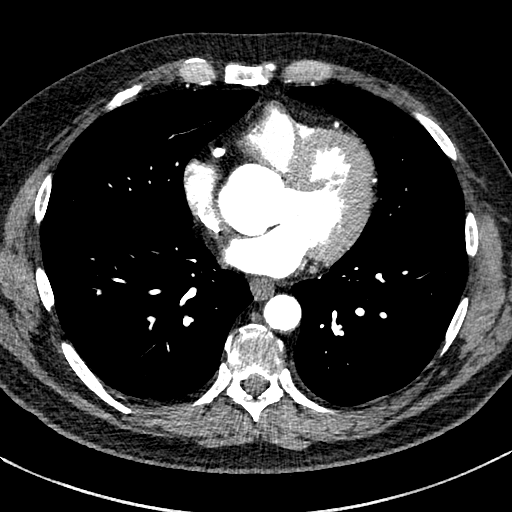
[im 92/171  lung]
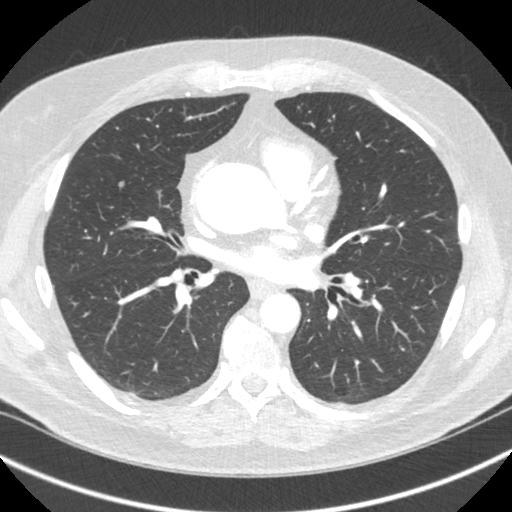
[im 105/171  mediastinal]
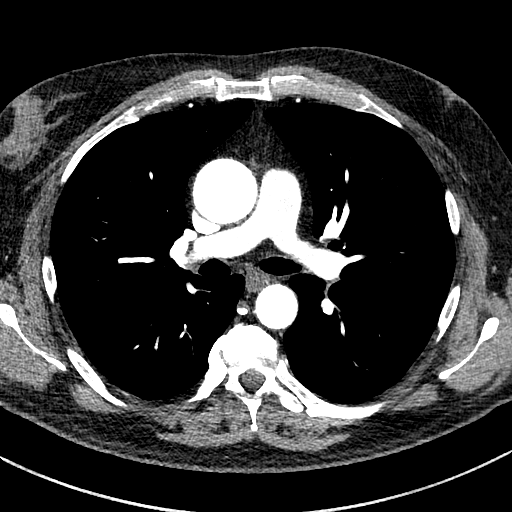
[im 118/171  lung]
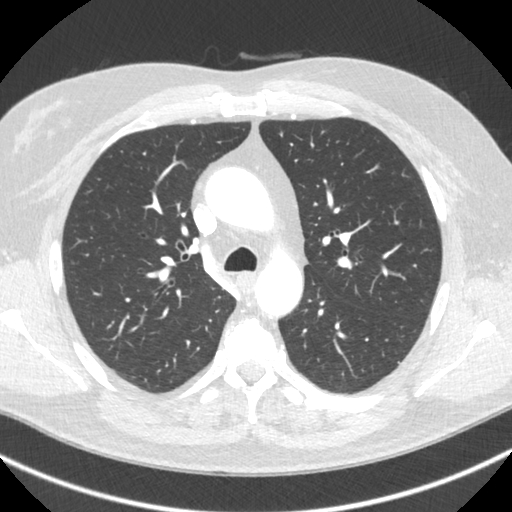
[im 131/171  mediastinal]
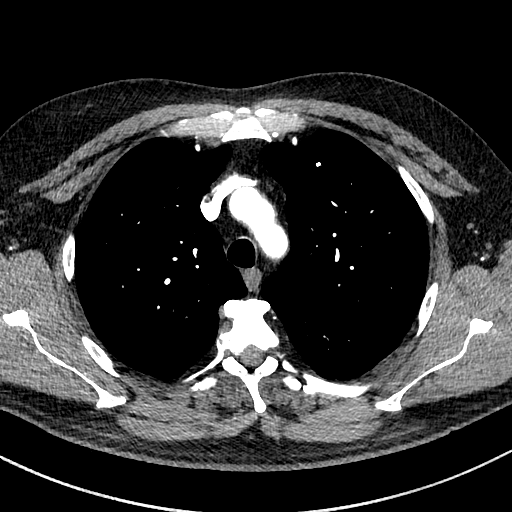
[im 144/171  lung]
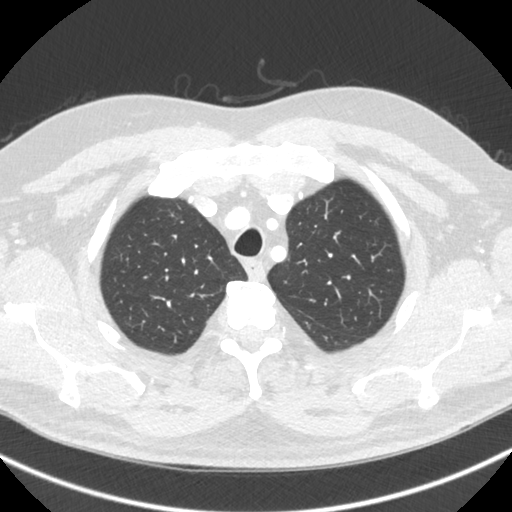
[im 157/171  mediastinal]
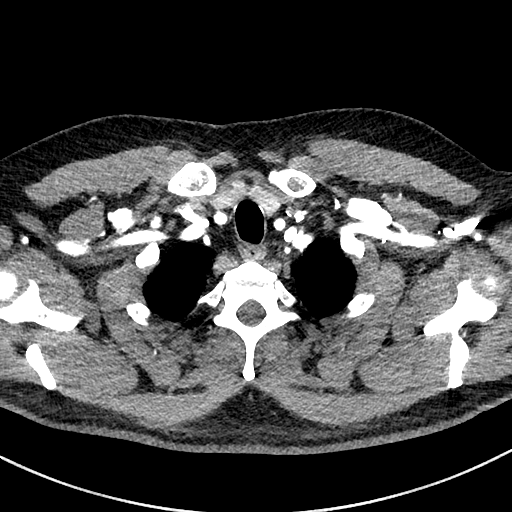

[Series 8: cor st cta thorax · coronal · 0.66mm/px · 1 of 152 slices shown]
[im 76/152  mediastinal]
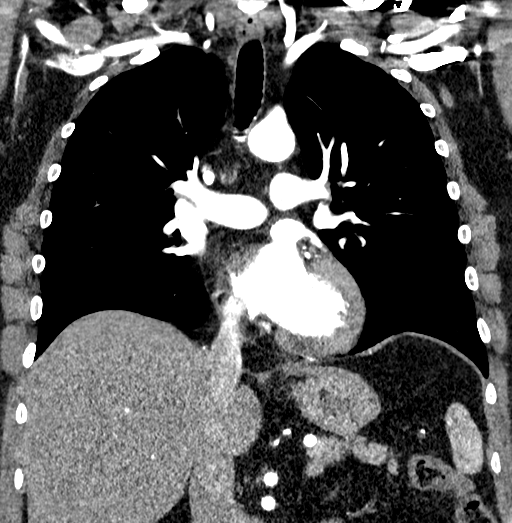

[13 of 36 positions shown; findings below may reference images not displayed]

FINDINGS: Cardiovascular: The thoracic aorta demonstrates stable aneurysmal
disease and dimensions by CTA. The aortic root measures 4.7-4.8 cm
at the level of the sinuses of Valsalva. No significant asymmetric
sinus of Valsalva aneurysm identified. The ascending thoracic aorta
measures 4.5 cm in greatest diameter. The aortic arch measures
cm. The descending thoracic aorta measures 2.4 cm. No evidence of
aortic dissection. No significant atherosclerotic plaque in the
thoracic aorta. Visualized proximal great vessels show normal
patency and normal branching anatomy.

The heart size is within normal limits. No pericardial fluid
identified. Calcified plaque is identified in the distribution of
the LAD and right coronary artery. Central pulmonary arteries are
normal in caliber. The dilated ascending thoracic aorta exerts some
degree of mass effect on the SVC with some degree of visible
narrowing of the distal SVC but no evidence significant collateral
vein formation or thrombus.

Mediastinum/Nodes: No mediastinal, hilar or axillary lymphadenopathy
identified. Stable CT appearance rim calcified left thyroid nodule
which has been previously characterized by ultrasound in 7491 and
6090.

Lungs/Pleura: There is no evidence of pulmonary edema,
consolidation, pneumothorax, nodule or pleural fluid.

Upper Abdomen: No acute abnormality.

Chest Wall: Asymmetric gynecomastia again noted with significant
glandular tissue on the right. This appears similar to the prior CT
in 7491.

Musculoskeletal: No chest wall abnormality. No acute or significant
osseous findings.

Review of the MIP images confirms the above findings.
IMPRESSION: 1. Stable aneurysmal disease of the thoracic aorta with aortic root
diameter of approximately 4.7-4.8 cm at the sinuses of Valsalva and
maximal ascending thoracic aorta diameter of 4.5 cm.
2. Coronary atherosclerosis with calcified plaque in the
distribution of the LAD and RCA.
3. Aneurysmal disease of the ascending thoracic aorta does exert
some mass effect on the SVC causing some apparent narrowing of the
distal SVC by CT. This is felt to not likely be hemodynamically
significant given lack of collateral veins.
4. Stable appearance of small rim calcified left thyroid nodule
which has been characterized by ultrasound and is being followed by
ultrasound.
5. Stable appearance of asymmetric gynecomastia of the right breast.

Aortic aneurysm NOS ([id number]).

## 2019-01-01 ENCOUNTER — Other Ambulatory Visit: Payer: Self-pay

## 2019-01-01 ENCOUNTER — Encounter: Payer: Self-pay | Admitting: Primary Care

## 2019-01-01 ENCOUNTER — Ambulatory Visit (INDEPENDENT_AMBULATORY_CARE_PROVIDER_SITE_OTHER): Payer: Managed Care, Other (non HMO) | Admitting: Primary Care

## 2019-01-01 VITALS — BP 120/76 | HR 67 | Temp 97.8°F | Ht 72.0 in | Wt 227.2 lb

## 2019-01-01 DIAGNOSIS — E041 Nontoxic single thyroid nodule: Secondary | ICD-10-CM

## 2019-01-01 DIAGNOSIS — Z0000 Encounter for general adult medical examination without abnormal findings: Secondary | ICD-10-CM | POA: Diagnosis not present

## 2019-01-01 DIAGNOSIS — I712 Thoracic aortic aneurysm, without rupture: Secondary | ICD-10-CM

## 2019-01-01 DIAGNOSIS — N529 Male erectile dysfunction, unspecified: Secondary | ICD-10-CM | POA: Diagnosis not present

## 2019-01-01 DIAGNOSIS — E1159 Type 2 diabetes mellitus with other circulatory complications: Secondary | ICD-10-CM

## 2019-01-01 DIAGNOSIS — I7121 Aneurysm of the ascending aorta, without rupture: Secondary | ICD-10-CM

## 2019-01-01 DIAGNOSIS — I5022 Chronic systolic (congestive) heart failure: Secondary | ICD-10-CM

## 2019-01-01 DIAGNOSIS — I10 Essential (primary) hypertension: Secondary | ICD-10-CM

## 2019-01-01 DIAGNOSIS — E782 Mixed hyperlipidemia: Secondary | ICD-10-CM

## 2019-01-01 HISTORY — DX: Nontoxic single thyroid nodule: E04.1

## 2019-01-01 MED ORDER — TADALAFIL 5 MG PO TABS: ORAL_TABLET | ORAL | 0 refills | Status: DC

## 2019-01-01 MED ORDER — GLIPIZIDE ER 10 MG PO TB24: 10.0000 mg | ORAL_TABLET | Freq: Every day | ORAL | 3 refills | Status: DC

## 2019-01-01 NOTE — Assessment & Plan Note (Signed)
LDL at goal from recent labs. Continue atorvastatin.

## 2019-01-01 NOTE — Assessment & Plan Note (Signed)
Doing well on PRN Cialis, refill sent to pharmacy.

## 2019-01-01 NOTE — Assessment & Plan Note (Signed)
Tetanus overdue, patient declines. Pneumovax UTD. PSA UTD. Colon cancer screening UTD. Strongly advised he start exercising and also reduce snacking. Work on diet. Exam unremarkable. Labs reviewed. Follow up in 1 year for CPE.

## 2019-01-01 NOTE — Assessment & Plan Note (Signed)
Stable, appears euvolemic. Continue beta blocker, furosemide. Strongly advised he work on weight loss through diet and exercise. Following with cardiology and will be scheduling an appointment.

## 2019-01-01 NOTE — Progress Notes (Signed)
Subjective:    Patient ID: Robyne PeersWilliam Sledge, male    DOB: 08-Aug-1965, 53 y.o.   MRN: [id number]  HPI  Mr. Clarene DukeLittle is a 53 year old male who presents today for complete physical.  Immunizations: -Tetanus: Completed in 2003, declines  -Influenza: Due this season  -Pneumonia: Completed in 2019  Diet:   Breakfast: Eggs, biscuits Lunch: Soup, occasional take out Dinner: Salad with chicken, meat, vegetables, starch Snacks: Chips, nuts, fruit Desserts: Once weekly  Beverages: Water, Vitamin Water Zero, soda daily, unsweet tea  Exercise: He is sedentary mostly, sometimes will walk around his house  Eye exam: Scheduled for July 2020 Dental exam: Completes semi-annually  Colonoscopy: Completed in 2017, due in 2027 PSA: 0.25 in 2019  BP Readings from Last 3 Encounters:  01/01/19 120/76  09/02/18 112/66  04/24/18 112/60     Review of Systems  Constitutional: Negative for unexpected weight change.  HENT: Negative for rhinorrhea.   Respiratory: Negative for cough and shortness of breath.   Cardiovascular: Negative for chest pain.  Gastrointestinal: Negative for constipation and diarrhea.  Genitourinary: Negative for difficulty urinating.  Musculoskeletal: Negative for arthralgias and myalgias.  Skin: Negative for rash.  Allergic/Immunologic: Negative for environmental allergies.  Neurological: Negative for dizziness, numbness and headaches.  Psychiatric/Behavioral: The patient is not nervous/anxious.        Past Medical History:  Diagnosis Date  . Aortic insufficiency    a. TTE 11/30/14: EF 25-30%, bicupid valve cannot be excluded, mod-severe aortic regurge, aortic root dimension 52 mm, mild MR, mildly dilated LA/RA, PASP 60; TEE 12/01/14: EF 25-30%, diffuse HK, aortic valve trileaflet, mild to mod Ao regurge, ascending aortic grossly measured at 4.5 cm (not well visualized),   . Ascending aorta dilatation (HCC)    a. 52 mm by TTE 11/2014; b. 45 mm by TEE 11/2014 (not well  visualized)  . Chronic systolic CHF (congestive heart failure) (HCC)    a. TTE 11/30/14: EF 25-30%, bicupid valve cannot be excluded, mod-severe aortic regurge, aortic root dimension 52 mm, mild MR, mildly dilated LA/RA, PASP 60; TEE 12/01/14: EF 25-30%, diffuse HK, aortic valve trileaflet, mild to mod Ao regurge, ascending aortic grossly measured at 4.5 cm (not well visualized),   . Diabetes mellitus without complication (HCC)   . Hyperlipidemia   . Hypertension   . Influenza A 09/02/2018  . Nonischemic dilated cardiomyopathy (HCC)    a. cath 12/02/14: right dom coronary system w/ mild lumenal irregs, o/w no sig CAD, sev diffuse HK EF 25%, no sig MR or AS, med Rx recommeded      Social History   Socioeconomic History  . Marital status: Married    Spouse name: Not on file  . Number of children: Not on file  . Years of education: Not on file  . Highest education level: Not on file  Occupational History  . Not on file  Social Needs  . Financial resource strain: Not on file  . Food insecurity    Worry: Not on file    Inability: Not on file  . Transportation needs    Medical: Not on file    Non-medical: Not on file  Tobacco Use  . Smoking status: Never Smoker  . Smokeless tobacco: Never Used  Substance and Sexual Activity  . Alcohol use: No    Alcohol/week: 0.0 standard drinks  . Drug use: No  . Sexual activity: Not on file  Lifestyle  . Physical activity    Days per week: Not on  file    Minutes per session: Not on file  . Stress: Not on file  Relationships  . Social Musicianconnections    Talks on phone: Not on file    Gets together: Not on file    Attends religious service: Not on file    Active member of club or organization: Not on file    Attends meetings of clubs or organizations: Not on file    Relationship status: Not on file  . Intimate partner violence    Fear of current or ex partner: Not on file    Emotionally abused: Not on file    Physically abused: Not on file     Forced sexual activity: Not on file  Other Topics Concern  . Not on file  Social History Narrative   Married.   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   No children.   Enjoys playing guitar, traveling, spending time with his wife.    Past Surgical History:  Procedure Laterality Date  . CARDIAC CATHETERIZATION N/A 12/02/2014   Procedure: Left Heart Cath and Coronary Angiography;  Surgeon: Antonieta Ibaimothy J Gollan, MD;  Location: [facility] INV[facility]IVE [facility];  Service: Cardiovascular;  Laterality: N/A;  . 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 CORONARY ANGIOPLASTY    . OTHER SURGICAL HISTORY     none    Family History  Problem Relation Age of Onset  . Coronary artery disease Mother   . Hypertension Father   . Diabetes Mellitus II Paternal Uncle   . Colon cancer Neg Hx     No Known Allergies  Current Outpatient Medications on File Prior to Visit  Medication Sig Dispense Refill  . aspirin 81 MG tablet Take 81 mg by mouth daily.    Marland Kitchen. atorvastatin (LIPITOR) 40 MG tablet TAKE 1 TABLET BY MOUTH AT BEDTIME. 90 tablet 3  . carvedilol (COREG) 6.25 MG tablet TAKE 1 TABLET BY MOUTH 2 TIMES DAILY WITH A MEAL. 180 tablet 3  . furosemide (LASIX) 40 MG tablet Take 1 tablet (40 mg total) by mouth daily. 30 tablet 6  . furosemide (LASIX) 40 MG tablet Take 1 tablet (40 mg total) by mouth daily. 180 tablet 2  . glipiZIDE (GLUCOTROL XL) 5 MG 24 hr tablet TAKE 1 TABLET BY MOUTH DAILY WITH BREAKFAST. 90 tablet 1  . losartan (COZAAR) 100 MG tablet TAKE 1 TABLET BY MOUTH EVERY DAY 90 tablet 3  . metFORMIN (GLUCOPHAGE) 1000 MG tablet TAKE 1 TABLET BY MOUTH TWICE A DAY WITHA MEAL 180 tablet 1  . spironolactone (ALDACTONE) 25 MG tablet TAKE 1 TABLET BY MOUTH 2 TIMES DAILY. 180 tablet 3  . tadalafil (CIALIS) 5 MG tablet TAKE 1 TABLET BY MOUTH DAILY AS NEEDED FOR ERECTILE DYSFUNCTION **INS WILL PAY 4/28** 8 tablet 0   Current Facility-Administered Medications on File Prior to Visit  Medication  Dose Route Frequency Provider Last Rate Last Dose  . acetaminophen (TYLENOL) tablet 650 mg  650 mg Oral Q4H PRN Gollan, Tollie Pizzaimothy J, MD        BP 120/76   Pulse 67   Temp 97.8 F (36.6 C) (Tympanic)   Ht 6' (1.829 m)   Wt 227 lb 4 oz (103.1 kg)   SpO2 97%   BMI 30.82 kg/m    Objective:   Physical Exam  Constitutional: He is oriented to person, place, and time. He appears well-nourished.  HENT:  Mouth/Throat: No oropharyngeal exudate.  Eyes: Pupils are  equal, round, and reactive to light. EOM are normal.  Neck: Neck supple. No thyromegaly present.  Cardiovascular: Normal rate and regular rhythm.  Respiratory: Effort normal and breath sounds normal.  GI: Soft. Bowel sounds are normal. There is no abdominal tenderness.  Musculoskeletal: Normal range of motion.  Neurological: He is alert and oriented to person, place, and time.  Skin: Skin is warm and dry.  Psychiatric: He has a normal mood and affect.           Assessment & Plan:

## 2019-01-01 NOTE — Assessment & Plan Note (Signed)
CTA of chest in October 2019 with stable appearing aneurysm. Following with cardiology. BP under good control.

## 2019-01-01 NOTE — Assessment & Plan Note (Signed)
Stable in the office today, continue current regimen.  BMP reviewed. 

## 2019-01-01 NOTE — Patient Instructions (Signed)
We've increased the dose of your glipizide to 10 mg. You may double up on your 5 mg dose until your current bottle is empty. I sent a new prescription to your pharmacy.  Start exercising. You should be getting 150 minutes of moderate intensity exercise weekly.  It is important that you improve your diet. Please limit carbohydrates in the form of white bread, rice, pasta, sweets, fast food, fried food, sugary drinks, etc. Increase your consumption of fresh fruits and vegetables, whole grains, lean protein.  Ensure you are consuming 64 ounces of water daily.  Please schedule a follow up appointment in 6 months for diabetes check.   It was a pleasure to see you today!   Preventive Care 40-64 Years, Male Preventive care refers to lifestyle choices and visits with your health care provider that can promote health and wellness. What does preventive care include?   A yearly physical exam. This is also called an annual well check.  Dental exams once or twice a year.  Routine eye exams. Ask your health care provider how often you should have your eyes checked.  Personal lifestyle choices, including: ? Daily care of your teeth and gums. ? Regular physical activity. ? Eating a healthy diet. ? Avoiding tobacco and drug use. ? Limiting alcohol use. ? Practicing safe sex. ? Taking low-dose aspirin every day starting at age 63. What happens during an annual well check? The services and screenings done by your health care provider during your annual well check will depend on your age, overall health, lifestyle risk factors, and family history of disease. Counseling Your health care provider may ask you questions about your:  Alcohol use.  Tobacco use.  Drug use.  Emotional well-being.  Home and relationship well-being.  Sexual activity.  Eating habits.  Work and work Statistician. Screening You may have the following tests or measurements:  Height, weight, and BMI.  Blood  pressure.  Lipid and cholesterol levels. These may be checked every 5 years, or more frequently if you are over 74 years old.  Skin check.  Lung cancer screening. You may have this screening every year starting at age 56 if you have a 30-pack-year history of smoking and currently smoke or have quit within the past 15 years.  Colorectal cancer screening. All adults should have this screening starting at age 50 and continuing until age 59. Your health care provider may recommend screening at age 16. You will have tests every 1-10 years, depending on your results and the type of screening test. People at increased risk should start screening at an earlier age. Screening tests may include: ? Guaiac-based fecal occult blood testing. ? Fecal immunochemical test (FIT). ? Stool DNA test. ? Virtual colonoscopy. ? Sigmoidoscopy. During this test, a flexible tube with a tiny camera (sigmoidoscope) is used to examine your rectum and lower colon. The sigmoidoscope is inserted through your anus into your rectum and lower colon. ? Colonoscopy. During this test, a long, thin, flexible tube with a tiny camera (colonoscope) is used to examine your entire colon and rectum.  Prostate cancer screening. Recommendations will vary depending on your family history and other risks.  Hepatitis C blood test.  Hepatitis B blood test.  Sexually transmitted disease (STD) testing.  Diabetes screening. This is done by checking your blood sugar (glucose) after you have not eaten for a while (fasting). You may have this done every 1-3 years. Discuss your test results, treatment options, and if necessary, the need for more  tests with your health care provider. Vaccines Your health care provider may recommend certain vaccines, such as:  Influenza vaccine. This is recommended every year.  Tetanus, diphtheria, and acellular pertussis (Tdap, Td) vaccine. You may need a Td booster every 10 years.  Varicella vaccine. You may  need this if you have not been vaccinated.  Zoster vaccine. You may need this after age 36.  Measles, mumps, and rubella (MMR) vaccine. You may need at least one dose of MMR if you were born in 1957 or later. You may also need a second dose.  Pneumococcal 13-valent conjugate (PCV13) vaccine. You may need this if you have certain conditions and have not been vaccinated.  Pneumococcal polysaccharide (PPSV23) vaccine. You may need one or two doses if you smoke cigarettes or if you have certain conditions.  Meningococcal vaccine. You may need this if you have certain conditions.  Hepatitis A vaccine. You may need this if you have certain conditions or if you travel or work in places where you may be exposed to hepatitis A.  Hepatitis B vaccine. You may need this if you have certain conditions or if you travel or work in places where you may be exposed to hepatitis B.  Haemophilus influenzae type b (Hib) vaccine. You may need this if you have certain risk factors. Talk to your health care provider about which screenings and vaccines you need and how often you need them. This information is not intended to replace advice given to you by your health care provider. Make sure you discuss any questions you have with your health care provider. Document Released: 07/23/2015 Document Revised: 08/16/2017 Document Reviewed: 04/27/2015 Elsevier Interactive Patient Education  2019 [employer].

## 2019-01-01 NOTE — Assessment & Plan Note (Signed)
Scheduled for repeat thyroid ultrasound for later this month. Await results.

## 2019-01-01 NOTE — Assessment & Plan Note (Signed)
Recent A1C above goal in the setting of CAD. Goal should be <6.5. This is likely due to snacking, weight gain, lack of exercise. Increase Glipizide ER to 10 mg. Continue Metformin.  Managed on statin and ARB. Pneumonia vaccination UTD. Eye exam scheduled.  Follow up in 6 months.

## 2019-01-07 ENCOUNTER — Ambulatory Visit
Admission: RE | Admit: 2019-01-07 | Discharge: 2019-01-07 | Disposition: A | Discharge status: Home / Self Care | Payer: Managed Care, Other (non HMO) | Source: Ambulatory Visit | Attending: Primary Care | Admitting: Primary Care

## 2019-01-07 ENCOUNTER — Other Ambulatory Visit: Payer: Self-pay

## 2019-01-07 DIAGNOSIS — E041 Nontoxic single thyroid nodule: Secondary | ICD-10-CM

## 2019-01-30 LAB — HM DIABETES EYE EXAM

## 2019-02-18 ENCOUNTER — Encounter: Payer: Self-pay | Admitting: Primary Care

## 2019-03-01 ENCOUNTER — Other Ambulatory Visit: Payer: Self-pay | Admitting: Cardiovascular Disease

## 2019-03-01 DIAGNOSIS — E785 Hyperlipidemia, unspecified: Secondary | ICD-10-CM

## 2019-03-10 ENCOUNTER — Other Ambulatory Visit: Payer: Self-pay | Admitting: Primary Care

## 2019-03-10 DIAGNOSIS — N529 Male erectile dysfunction, unspecified: Secondary | ICD-10-CM

## 2019-03-26 ENCOUNTER — Other Ambulatory Visit: Payer: Self-pay | Admitting: Cardiovascular Disease

## 2019-03-26 DIAGNOSIS — I10 Essential (primary) hypertension: Secondary | ICD-10-CM

## 2019-04-05 NOTE — Progress Notes (Signed)
Cardiology Office Note  Date:  04/08/2019   ID:  Dennis PeersWilliam Haggart, DOB [birth year], MRN [id number]  PCP:  Doreene Nestlark, Katherine K, NP   Chief Complaint  Patient presents with  . Other    12 month follow up. Patient denies chest pain and SOB at this time. Meds reviewed verbally with patient.     HPI:  53 year old gentleman with PMH of DM II Nonischemic cardiomyopathy EF 25% , up to 45 to 50% in 2016, 50 to 55%  In 2017 No CAD on cardiac cath, trileaflet aortic valve aorta 4.5 cm on TEE, dilated ascending aorta and aortic root  who presents for routine follow-up of his systolic CHF and dilated ascending aorta  Echo 2019 results discussed with him in detail Left ventricle: The cavity size was at the upper limits of   normal. Wall thickness was increased in a pattern of mild LVH.   Systolic function was normal. The estimated ejection fraction was   in the range of 50% to 55%. Doppler parameters are consistent   with abnormal left ventricular relaxation (grade 1 diastolic   dysfunction). - Aortic valve: Trileaflet. There was moderate regurgitation. - Aortic root: The aortic root was moderately to severely dilated. - Ascending aorta: The ascending aorta was moderately dilated.  - Compared with prior echo in 04/2017, aortic root has enlarged and   is now moderately to severely dilated.  CT chest: 1. Stable aneurysmal disease of the thoracic aorta with aortic root diameter of approximately 4.7-4.8 cm at the sinuses of Valsalva and maximal ascending thoracic aorta diameter of 4.5 cm. 2. Coronary atherosclerosis with calcified plaque in the distribution of the LAD and RCA. 3. Aneurysmal disease of the ascending thoracic aorta does exert some mass effect on the SVC causing some apparent narrowing of the distal SVC by CT.   CT the same as 2017 Hba1C 7.9 total chol 120  No regular exercise program Weight high  Walking around house  EKG personally reviewed by myself on todays  visit NSR raet 73 bpm, LAFB  Previous imaging reviewed in detail, CT chest 04/2016  Coronary calcium three vessel, 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   TEE 2015 4.5 aorta ascending Aorta Root 4.4  Echo TTE 2016  Ascending  aorta 4.5   Initially developed progressive chest congestion initially felt to be allergies, treated with several rounds of ABX,  presenting to the hospital with worsening shortness of breath found to have acute systolic CHF, EF 40%25%, dilated ascending aorta, cardiac catheterization showing no significant CAD, aorta 4.5 cm on TEE,  PMH:   has a past medical history of Aortic insufficiency, Ascending aorta dilatation (HCC), Chronic systolic CHF (congestive heart failure) (HCC), Diabetes mellitus without complication (HCC), Hyperlipidemia, Hypertension, Influenza A (09/02/2018), and Nonischemic dilated cardiomyopathy (HCC).  PSH:    Past Surgical History:  Procedure Laterality Date  . CARDIAC CATHETERIZATION N/A 12/02/2014   Procedure: Left Heart Cath and Coronary Angiography;  Surgeon: Antonieta Ibaimothy J Gollan, MD;  Location: [facility];  Service: Cardiovascular;  Laterality: N/A;  . 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 CORONARY ANGIOPLASTY    . OTHER SURGICAL HISTORY     none    Current Outpatient Medications  Medication Sig Dispense Refill  . aspirin 81 MG tablet Take 81 mg by mouth daily.    Marland Kitchen. atorvastatin (LIPITOR) 40 MG tablet TAKE 1 TABLET BY MOUTH AT BEDTIME. 90 tablet 3  . carvedilol (  COREG) 6.25 MG tablet TAKE 1 TABLET BY MOUTH 2 TIMES DAILY WITH A MEAL. 180 tablet 0  . furosemide (LASIX) 40 MG tablet Take 1 tablet (40 mg total) by mouth daily. 30 tablet 6  . glipiZIDE (GLUCOTROL XL) 10 MG 24 hr tablet Take 1 tablet (10 mg total) by mouth daily with breakfast. For diabetes. 90 tablet 3  . losartan (COZAAR) 100 MG tablet TAKE 1 TABLET BY MOUTH EVERY DAY 90 tablet 3  . metFORMIN (GLUCOPHAGE) 1000 MG tablet  TAKE 1 TABLET BY MOUTH TWICE A DAY WITHA MEAL 180 tablet 1  . spironolactone (ALDACTONE) 25 MG tablet TAKE 1 TABLET BY MOUTH 2 TIMES DAILY. 180 tablet 0  . tadalafil (CIALIS) 5 MG tablet TAKE 1 TABLET BY MOUTH DAILY AS NEEDED FOR ERECTILE DYSFUNCTION **INS WILL PAY 09/10/18** 8 tablet 2   No current facility-administered medications for this visit.    Facility-Administered Medications Ordered in Other Visits  Medication Dose Route Frequency Provider Last Rate Last Dose  . acetaminophen (TYLENOL) tablet 650 mg  650 mg Oral Q4H PRN Antonieta Iba, MD         Allergies:   Patient has no known allergies.   Social History:  The patient  reports that he has never smoked. He has never used smokeless tobacco. He reports that he does not drink alcohol or use drugs.   Family History:   family history includes Coronary artery disease in his mother; Diabetes Mellitus II in his paternal uncle; Hypertension in his father.    Review of Systems: Review of Systems  Constitutional: Negative.   Respiratory: Negative.   Cardiovascular: Negative.   Gastrointestinal: Negative.   Musculoskeletal: Negative.   Neurological: Negative.   Psychiatric/Behavioral: Negative.   All other systems reviewed and are negative.    PHYSICAL EXAM: VS:  BP 122/62 (BP Location: Left Arm, Patient Position: Sitting, Cuff Size: Normal)   Pulse 73   Ht 6' (1.829 m)   Wt 228 lb (103.4 kg)   BMI 30.92 kg/m  , BMI Body mass index is 30.92 kg/m. Constitutional:  oriented to person, place, and time. No distress.  HENT:  Head: Grossly normal Eyes:  no discharge. No scleral icterus.  Neck: No JVD, no carotid bruits  Cardiovascular: Regular rate and rhythm, no murmurs appreciated Pulmonary/Chest: Clear to auscultation bilaterally, no wheezes or rails Abdominal: Soft.  no distension.  no tenderness.  Musculoskeletal: Normal range of motion Neurological:  normal muscle tone. Coordination normal. No atrophy Skin: Skin  warm and dry Psychiatric: normal affect, pleasant   Recent Labs: 12/25/2018: ALT 25; BUN 13; Creatinine, Ser 1.00; Potassium 3.7; Sodium 140    Lipid Panel Lab Results  Component Value Date   CHOL 126 12/25/2018   HDL 27.90 (L) 12/25/2018   TRIG 302.0 (H) 12/25/2018      Wt Readings from Last 3 Encounters:  04/08/19 228 lb (103.4 kg)  01/01/19 227 lb 4 oz (103.1 kg)  09/02/18 230 lb 1 oz (104.4 kg)       ASSESSMENT AND PLAN:  Ascending aorta dilation (HCC) - Previous results from 2015, 2016, 2017 2018 reviewed He would like to repeat echocardiogram 2019  CT scan 2017 and 2019 Stable 4.5 cm  Repeat scan next year  systolic CHF (congestive heart failure) (HCC) -   ejection fraction 50-55% last year 2018 stable  Aortic valve disorder -  trileaflet on transesophageal echo  Essential hypertension -  Blood pressure is well controlled on today's visit. No  changes made to the medications.  Nonischemic dilated cardiomyopathy (HCC) -  Dramatic improvement in ejection fraction over the past several years, stable  Aneurysm, ascending aorta (HCC) Discussed, stable   Total encounter time more than 25 minutes  Greater than 50% was spent in counseling and coordination of care with the patient   Disposition:   F/U  12 months   Orders Placed This Encounter  Procedures  . EKG 12-Lead     Signed, Esmond Plants, M.D., Ph.D. 04/08/2019  [place], [place]

## 2019-04-08 ENCOUNTER — Ambulatory Visit (INDEPENDENT_AMBULATORY_CARE_PROVIDER_SITE_OTHER): Payer: Managed Care, Other (non HMO) | Admitting: Cardiovascular Disease

## 2019-04-08 ENCOUNTER — Other Ambulatory Visit: Payer: Self-pay

## 2019-04-08 ENCOUNTER — Encounter: Payer: Self-pay | Admitting: Cardiovascular Disease

## 2019-04-08 VITALS — BP 122/62 | HR 73 | Ht 72.0 in | Wt 228.0 lb

## 2019-04-08 DIAGNOSIS — E782 Mixed hyperlipidemia: Secondary | ICD-10-CM

## 2019-04-08 DIAGNOSIS — I719 Aortic aneurysm of unspecified site, without rupture: Secondary | ICD-10-CM

## 2019-04-08 DIAGNOSIS — E1159 Type 2 diabetes mellitus with other circulatory complications: Secondary | ICD-10-CM | POA: Diagnosis not present

## 2019-04-08 DIAGNOSIS — I712 Thoracic aortic aneurysm, without rupture: Secondary | ICD-10-CM

## 2019-04-08 DIAGNOSIS — I5022 Chronic systolic (congestive) heart failure: Secondary | ICD-10-CM

## 2019-04-08 DIAGNOSIS — I359 Nonrheumatic aortic valve disorder, unspecified: Secondary | ICD-10-CM

## 2019-04-08 DIAGNOSIS — I7121 Aneurysm of the ascending aorta, without rupture: Secondary | ICD-10-CM

## 2019-04-08 DIAGNOSIS — I10 Essential (primary) hypertension: Secondary | ICD-10-CM

## 2019-04-08 DIAGNOSIS — I420 Dilated cardiomyopathy: Secondary | ICD-10-CM | POA: Diagnosis not present

## 2019-04-08 NOTE — Patient Instructions (Signed)

## 2019-04-19 ENCOUNTER — Other Ambulatory Visit: Payer: Self-pay | Admitting: Primary Care

## 2019-04-19 DIAGNOSIS — E1165 Type 2 diabetes mellitus with hyperglycemia: Secondary | ICD-10-CM

## 2019-04-29 ENCOUNTER — Other Ambulatory Visit: Payer: Self-pay | Admitting: Primary Care

## 2019-05-25 ENCOUNTER — Other Ambulatory Visit: Payer: Self-pay | Admitting: Cardiovascular Disease

## 2019-06-08 ENCOUNTER — Other Ambulatory Visit: Payer: Self-pay | Admitting: Cardiovascular Disease

## 2019-06-08 DIAGNOSIS — I10 Essential (primary) hypertension: Secondary | ICD-10-CM

## 2019-06-09 NOTE — Telephone Encounter (Signed)
Please verify that patient is to take spironolactone twice daily. Thank you!

## 2019-06-19 ENCOUNTER — Other Ambulatory Visit: Payer: Self-pay | Admitting: Primary Care

## 2019-06-19 ENCOUNTER — Other Ambulatory Visit: Payer: Self-pay | Admitting: Cardiovascular Disease

## 2019-06-30 ENCOUNTER — Other Ambulatory Visit: Payer: Self-pay

## 2019-06-30 ENCOUNTER — Encounter: Payer: Self-pay | Admitting: Primary Care

## 2019-06-30 ENCOUNTER — Ambulatory Visit: Payer: Managed Care, Other (non HMO) | Admitting: Primary Care

## 2019-06-30 VITALS — BP 124/70 | HR 82 | Temp 96.1°F | Ht 72.0 in | Wt 228.2 lb

## 2019-06-30 DIAGNOSIS — E1159 Type 2 diabetes mellitus with other circulatory complications: Secondary | ICD-10-CM

## 2019-06-30 LAB — POCT GLYCOSYLATED HEMOGLOBIN (HGB A1C): Hemoglobin A1C: 9.8 % — AB (ref 4.0–5.6)

## 2019-06-30 NOTE — Patient Instructions (Signed)
Your diabetes is uncontrolled. This puts you at risk for heart disease/stroke/kidney disease if it continues.   Start exercising. You should be getting 150 minutes of moderate intensity exercise weekly.  It is important that you improve your diet. Please limit carbohydrates in the form of white bread, rice, pasta, sweets, fast food, fried food, sugary drinks, etc. Increase your consumption of fresh fruits and vegetables, whole grains, lean protein.  Ensure you are consuming 64 ounces of water daily.  Start checking your blood sugar levels at least once daily. Appropriate times to check your blood sugar levels are:  -Before any meal (breakfast, lunch, dinner) -Two hours after any meal (breakfast, lunch, dinner) -Bedtime  Record your readings and notify me if you continue to consistently run at or above 200 after one month.  Please schedule a follow up appointment in 3 months. It was a pleasure to see you today!

## 2019-06-30 NOTE — Assessment & Plan Note (Addendum)
Uncontrolled with A1C today of 9.8 which is a drastic increase from Summer 2020.  We had a long/frank discussion regarding these uncontrolled levels and the absolute need to get these down given his medical history.  I recommended additional treatment for which he declines. He wants to work on weight loss through dietary changes and exercise. We discussed the potential effects of long standing hyperglycemia, he verbalized understanding. Instructed patient to start checking glucose levels at least once daily, rotating times of checks. Glucose logs provided.  I also offered nutritionist referral for which he kindly declines as he's already completed.   Foot exam today. Pneumonia vaccination.  Managed on ACE and statin. Continue current regimen.  Follow up in 3 months. He will call sooner if glucose readings remain at or above 200 on a consistent basis after one month.

## 2019-06-30 NOTE — Progress Notes (Signed)
Subjective:    Patient ID: Dennis Curry, male    DOB: 1965/11/10, 53 y.o.   MRN: [id number]  HPI  Mr. Prater is a 53 year old male with a history of aortic aneurysm, hypertension, CHF, aortic valve disorder, type 2 diabetes, hyperlipidemia who presents today for follow up of diabetes.  Current medications include: Glipizide XL 10 mg, Metformin 1000 mg BID.  He is checking his blood glucose several times monthly and is getting readings of:  AM fasting: low 120's  Last A1C: 7.9 in June 2020 Last Eye Exam: Completed in 2020 Last Foot Exam: Due today Pneumonia Vaccination: Completed in 2019 ACE/ARB: Losartan Statin: atorvastatin   BP Readings from Last 3 Encounters:  06/30/19 124/70  04/08/19 122/62  01/01/19 120/76   Since working from home he's not as active. He also endorses a poor diet since working from home.   Review of Systems  Respiratory: Negative for shortness of breath.   Cardiovascular: Negative for chest pain.  Neurological: Negative for dizziness, numbness and headaches.       Past Medical History:  Diagnosis Date  . Aortic insufficiency    a. TTE 11/30/14: EF 25-30%, bicupid valve cannot be excluded, mod-severe aortic regurge, aortic root dimension 52 mm, mild MR, mildly dilated LA/RA, PASP 60; TEE 12/01/14: EF 25-30%, diffuse HK, aortic valve trileaflet, mild to mod Ao regurge, ascending aortic grossly measured at 4.5 cm (not well visualized),   . Ascending aorta dilatation (HCC)    a. 52 mm by TTE 11/2014; b. 45 mm by TEE 11/2014 (not well visualized)  . Chronic systolic CHF (congestive heart failure) (HCC)    a. TTE 11/30/14: EF 25-30%, bicupid valve cannot be excluded, mod-severe aortic regurge, aortic root dimension 52 mm, mild MR, mildly dilated LA/RA, PASP 60; TEE 12/01/14: EF 25-30%, diffuse HK, aortic valve trileaflet, mild to mod Ao regurge, ascending aortic grossly measured at 4.5 cm (not well visualized),   . Diabetes mellitus without complication (HCC)    . Hyperlipidemia   . Hypertension   . Influenza A 09/02/2018  . Nonischemic dilated cardiomyopathy (HCC)    a. cath 12/02/14: right dom coronary system w/ mild lumenal irregs, o/w no sig CAD, sev diffuse HK EF 25%, no sig MR or AS, med Rx recommeded      Social History   Socioeconomic History  . Marital status: Married    Spouse name: Not on file  . Number of children: Not on file  . Years of education: Not on file  . Highest education level: Not on file  Occupational History  . Not on file  Tobacco Use  . Smoking status: Never Smoker  . Smokeless tobacco: Never Used  Substance and Sexual Activity  . Alcohol use: No    Alcohol/week: 0.0 standard drinks  . Drug use: No  . Sexual activity: Not on file  Other Topics Concern  . Not on file  Social History Narrative   Married.   Works as a Engineer, structural at [employer].   No children.   Enjoys playing guitar, traveling, spending time with his wife.   Social Determinants of Health   Financial Resource Strain:   . Difficulty of Paying Living Expenses: Not on file  Food Insecurity:   . Worried About Programme researcher, broadcasting/film/video in the Last Year: Not on file  . Ran Out of Food in the Last Year: Not on file  Transportation Needs:   . Lack of Transportation (Medical): Not on file  .  Lack of Transportation (Non-Medical): Not on file  Physical Activity:   . Days of Exercise per Week: Not on file  . Minutes of Exercise per Session: Not on file  Stress:   . Feeling of Stress : Not on file  Social Connections:   . Frequency of Communication with Friends and Family: Not on file  . Frequency of Social Gatherings with Friends and Family: Not on file  . Attends Religious Services: Not on file  . Active Member of Clubs or Organizations: Not on file  . Attends Archivist Meetings: Not on file  . Marital Status: Not on file  Intimate Partner Violence:   . Fear of Current or Ex-Partner: Not on file  . Emotionally Abused: Not on file    . Physically Abused: Not on file  . Sexually Abused: Not on file    Past Surgical History:  Procedure Laterality Date  . CARDIAC CATHETERIZATION N/A 12/02/2014   Procedure: Left Heart Cath and Coronary Angiography;  Surgeon: Minna Merritts, MD;  Location: [facility];  Service: Cardiovascular;  Laterality: N/A;  . 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 CORONARY ANGIOPLASTY    . OTHER SURGICAL HISTORY     none    Family History  Problem Relation Age of Onset  . Coronary artery disease Mother   . Hypertension Father   . Diabetes Mellitus II Paternal Uncle   . Colon cancer Neg Hx     No Known Allergies  Current Outpatient Medications on File Prior to Visit  Medication Sig Dispense Refill  . aspirin 81 MG tablet Take 81 mg by mouth daily.    Marland Kitchen atorvastatin (LIPITOR) 40 MG tablet TAKE 1 TABLET BY MOUTH AT BEDTIME. 90 tablet 3  . carvedilol (COREG) 6.25 MG tablet TAKE 1 TABLET BY MOUTH 2 TIMES DAILY WITH A MEAL. 180 tablet 2  . furosemide (LASIX) 40 MG tablet TAKE 1 TABLET BY MOUTH EVERY DAY 90 tablet 2  . glipiZIDE (GLUCOTROL XL) 10 MG 24 hr tablet Take 1 tablet (10 mg total) by mouth daily with breakfast. For diabetes. 90 tablet 3  . losartan (COZAAR) 100 MG tablet TAKE 1 TABLET BY MOUTH EVERY DAY 90 tablet 2  . metFORMIN (GLUCOPHAGE) 1000 MG tablet TAKE 1 TABLET BY MOUTH TWICE A DAY WITH A MEAL 180 tablet 1  . spironolactone (ALDACTONE) 25 MG tablet TAKE 1 TABLET BY MOUTH 2 TIMES DAILY. 180 tablet 2  . tadalafil (CIALIS) 5 MG tablet TAKE 1 TABLET BY MOUTH DAILY AS NEEDED FOR ERECTILE DYSFUNCTION **INS WILL PAY 09/10/18** 8 tablet 2   Current Facility-Administered Medications on File Prior to Visit  Medication Dose Route Frequency Provider Last Rate Last Admin  . acetaminophen (TYLENOL) tablet 650 mg  650 mg Oral Q4H PRN Gollan, Kathlene November, MD        BP 124/70   Pulse 82   Temp (!) 96.1 F (35.6 C) (Temporal)   Ht 6' (1.829 m)   Wt 228 lb  4 oz (103.5 kg)   SpO2 98%   BMI 30.96 kg/m    Objective:   Physical Exam  Constitutional: He appears well-nourished.  Cardiovascular: Normal rate and regular rhythm.  Respiratory: Effort normal and breath sounds normal.  Musculoskeletal:     Cervical back: Neck supple.  Skin: Skin is warm and dry.  Psychiatric: He has a normal mood and affect.  Assessment & Plan:

## 2019-08-15 ENCOUNTER — Other Ambulatory Visit: Payer: Self-pay

## 2019-08-15 ENCOUNTER — Encounter: Payer: Self-pay | Admitting: Family Medicine

## 2019-08-15 ENCOUNTER — Ambulatory Visit (INDEPENDENT_AMBULATORY_CARE_PROVIDER_SITE_OTHER): Payer: Managed Care, Other (non HMO) | Admitting: Family Medicine

## 2019-08-15 VITALS — BP 136/74 | HR 77 | Ht 72.0 in | Wt 220.0 lb

## 2019-08-15 DIAGNOSIS — J3489 Other specified disorders of nose and nasal sinuses: Secondary | ICD-10-CM

## 2019-08-15 DIAGNOSIS — R42 Dizziness and giddiness: Secondary | ICD-10-CM

## 2019-08-15 MED ORDER — FLUTICASONE PROPIONATE 50 MCG/ACT NA SUSP: 2.0000 | Freq: Every day | NASAL | 6 refills | Status: DC

## 2019-08-15 NOTE — Progress Notes (Signed)
Virtual Visit via Video Note  I connected with Dennis Curry on 08/15/19 at  2:00 PM EST by a video enabled telemedicine application and verified that I am speaking with the correct person using two identifiers.  Location: Patient: In his home Provider: [facility] Persons participating in virtual visit: provider, patient, NP Student Luisa Hart.    I discussed the limitations of evaluation and management by telemedicine and the availability of in person appointments. The patient expressed understanding and agreed to proceed.  History of Present Illness: Chief Complaint  Patient presents with  . Dizziness    Pt c/o dizziness, lightheadedness x 1 day- started this morning around 8am. Pt also notes having some fatigue and sinus pressure. no other symptoms.   This is a 54 yo male who presents today for virtual visit. He awoke this morning and felt ok. Ate breakfast- biscuit and sausage. At 8 o'clock this am, he was sitting at his computer and became lightheaded. He has history of inner ear problems many years ago with similar symptoms. He denies sensation of room spinning. He denies any recent cold or flu symptoms. He did have some mild nausea when lightheadedness this first started this morning. He checked his blood sugar thinking maybe it was low but it was 250 after eating his breakfast. He denies nasal drainage, cough, chest congestion, shortness of breath. He does have some pain around the front which he calls "sinus pressure." He reports increased lightheadedness with moving his head. He denies any recent falls or visual changes. Denies ear pain, pressure or drainage.    Observations/Objective: Patient is alert and answers questions appropriately. Visible skin is unremarkable. He is normally conversive without increased work of breathing, audible wheeze or witnessed cough. Neck with full range of motion. Mood and affect appropriate. BP 136/74 (BP Location: Left Arm, Patient Position:  Sitting, Cuff Size: Normal)   Pulse 77   Ht 6' (1.829 m)   Wt 220 lb (99.8 kg)   SpO2 97%   BMI 29.84 kg/m  Wt Readings from Last 3 Encounters:  08/15/19 220 lb (99.8 kg)  06/30/19 228 lb 4 oz (103.5 kg)  04/08/19 228 lb (103.4 kg)    Assessment and Plan: 1. Sinus pressure - fluticasone (FLONASE) 50 MCG/ACT nasal spray; Place 2 sprays into both nostrils daily.  Dispense: 16 g; Refill: 6  2. Lightheadedness -Sounds viral in nature with accompanying sinus pressure, fatigue. -Discussed getting tested for COVID-19 -Symptomatic treatment -Follow-up precautions reviewed   Olean Ree, FNP-BC  [facility], [facility]  08/17/2019 1:09 PM   Follow Up Instructions:    I discussed the assessment and treatment plan with the patient. The patient was provided an opportunity to ask questions and all were answered. The patient agreed with the plan and demonstrated an understanding of the instructions.   The patient was advised to call back or seek an in-person evaluation if the symptoms worsen or if the condition fails to improve as anticipated.     Emi Belfast, FNP

## 2019-08-18 ENCOUNTER — Ambulatory Visit: Payer: Managed Care, Other (non HMO) | Admitting: Family Medicine

## 2019-09-04 ENCOUNTER — Other Ambulatory Visit: Payer: Self-pay | Admitting: Primary Care

## 2019-09-04 DIAGNOSIS — N529 Male erectile dysfunction, unspecified: Secondary | ICD-10-CM

## 2019-09-29 ENCOUNTER — Other Ambulatory Visit: Payer: Self-pay

## 2019-09-29 ENCOUNTER — Other Ambulatory Visit: Payer: Self-pay | Admitting: Primary Care

## 2019-09-29 ENCOUNTER — Encounter: Payer: Self-pay | Admitting: Primary Care

## 2019-09-29 ENCOUNTER — Ambulatory Visit: Payer: Managed Care, Other (non HMO) | Admitting: Primary Care

## 2019-09-29 VITALS — BP 118/58 | HR 75 | Temp 96.4°F | Wt 227.3 lb

## 2019-09-29 DIAGNOSIS — E1159 Type 2 diabetes mellitus with other circulatory complications: Secondary | ICD-10-CM

## 2019-09-29 DIAGNOSIS — E1165 Type 2 diabetes mellitus with hyperglycemia: Secondary | ICD-10-CM

## 2019-09-29 LAB — POCT GLYCOSYLATED HEMOGLOBIN (HGB A1C): Hemoglobin A1C: 7.9 % — AB (ref 4.0–5.6)

## 2019-09-29 NOTE — Progress Notes (Signed)
Subjective:    Patient ID: Dennis Curry, male    DOB: Dec 19, 1965, 54 y.o.   MRN: [id number]  HPI  This visit occurred during the SARS-CoV-2 public health emergency.  Safety protocols were in place, including screening questions prior to the visit, additional usage of staff PPE, and extensive cleaning of exam room while observing appropriate contact time as indicated for disinfecting solutions.   Dennis Curry is a 53 year old male with a history of type 2 diabetes, CHF, hypertension, ascending aortic aneurysm, hyperlipidemia who presents today for follow up of diabetes.  Current medications include: Glipizide XL 10 mg, Metformin 1000 mg BID  He is checking his blood glucose 1-2 times weekly and is getting readings of:  AM fasting 160-180  Last A1C: 9.8 in December 2020 Last Eye Exam: UTD Last Foot Exam: UTD Pneumonia Vaccination: Completed in 2019 ACE/ARB: losartan Statin: atorvastatin   BP Readings from Last 3 Encounters:  09/29/19 (!) 118/58  08/15/19 136/74  06/30/19 124/70   Since his last visit he's been eating more salads and greens. He is not exercising.     Review of Systems  Eyes: Negative for visual disturbance.  Respiratory: Negative for shortness of breath.   Cardiovascular: Negative for chest pain.  Neurological: Negative for dizziness and headaches.       Past Medical History:  Diagnosis Date  . Aortic insufficiency    a. TTE 11/30/14: EF 25-30%, bicupid valve cannot be excluded, mod-severe aortic regurge, aortic root dimension 52 mm, mild MR, mildly dilated LA/RA, PASP 60; TEE 12/01/14: EF 25-30%, diffuse HK, aortic valve trileaflet, mild to mod Ao regurge, ascending aortic grossly measured at 4.5 cm (not well visualized),   . Ascending aorta dilatation (HCC)    a. 52 mm by TTE 11/2014; b. 45 mm by TEE 11/2014 (not well visualized)  . Chronic systolic CHF (congestive heart failure) (HCC)    a. TTE 11/30/14: EF 25-30%, bicupid valve cannot be excluded,  mod-severe aortic regurge, aortic root dimension 52 mm, mild MR, mildly dilated LA/RA, PASP 60; TEE 12/01/14: EF 25-30%, diffuse HK, aortic valve trileaflet, mild to mod Ao regurge, ascending aortic grossly measured at 4.5 cm (not well visualized),   . Diabetes mellitus without complication (HCC)   . Hyperlipidemia   . Hypertension   . Influenza A 09/02/2018  . Nonischemic dilated cardiomyopathy (HCC)    a. cath 12/02/14: right dom coronary system w/ mild lumenal irregs, o/w no sig CAD, sev diffuse HK EF 25%, no sig MR or AS, med Rx recommeded      Social History   Socioeconomic History  . Marital status: Married    Spouse name: Not on file  . Number of children: Not on file  . Years of education: Not on file  . Highest education level: Not on file  Occupational History  . Not on file  Tobacco Use  . Smoking status: Never Smoker  . Smokeless tobacco: Never Used  Substance and Sexual Activity  . Alcohol use: No    Alcohol/week: 0.0 standard drinks  . Drug use: No  . Sexual activity: Not on file  Other Topics Concern  . Not on file  Social History Narrative   Married.   Works as a Engineer, structural at [employer].   No children.   Enjoys playing guitar, traveling, spending time with his wife.   Social Determinants of Health   Financial Resource Strain:   . Difficulty of Paying Living Expenses:   Food Insecurity:   .  Worried About Charity fundraiser in the Last Year:   . Arboriculturist in the Last Year:   Transportation Needs:   . Film/video editor (Medical):   Marland Kitchen Lack of Transportation (Non-Medical):   Physical Activity:   . Days of Exercise per Week:   . Minutes of Exercise per Session:   Stress:   . Feeling of Stress :   Social Connections:   . Frequency of Communication with Friends and Family:   . Frequency of Social Gatherings with Friends and Family:   . Attends Religious Services:   . Active Member of Clubs or Organizations:   . Attends Archivist  Meetings:   Marland Kitchen Marital Status:   Intimate Partner Violence:   . Fear of Current or Ex-Partner:   . Emotionally Abused:   Marland Kitchen Physically Abused:   . Sexually Abused:     Past Surgical History:  Procedure Laterality Date  . CARDIAC CATHETERIZATION N/A 12/02/2014   Procedure: Left Heart Cath and Coronary Angiography;  Surgeon: Minna Merritts, MD;  Location: [facility];  Service: Cardiovascular;  Laterality: N/A;  . 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 CORONARY ANGIOPLASTY    . OTHER SURGICAL HISTORY     none    Family History  Problem Relation Age of Onset  . Coronary artery disease Mother   . Hypertension Father   . Diabetes Mellitus II Paternal Uncle   . Colon cancer Neg Hx     No Known Allergies  Current Outpatient Medications on File Prior to Visit  Medication Sig Dispense Refill  . aspirin 81 MG tablet Take 81 mg by mouth daily.    Marland Kitchen atorvastatin (LIPITOR) 40 MG tablet TAKE 1 TABLET BY MOUTH AT BEDTIME. 90 tablet 3  . carvedilol (COREG) 6.25 MG tablet TAKE 1 TABLET BY MOUTH 2 TIMES DAILY WITH A MEAL. 180 tablet 2  . furosemide (LASIX) 40 MG tablet TAKE 1 TABLET BY MOUTH EVERY DAY 90 tablet 2  . glipiZIDE (GLUCOTROL XL) 10 MG 24 hr tablet Take 1 tablet (10 mg total) by mouth daily with breakfast. For diabetes. 90 tablet 3  . losartan (COZAAR) 100 MG tablet TAKE 1 TABLET BY MOUTH EVERY DAY 90 tablet 2  . metFORMIN (GLUCOPHAGE) 1000 MG tablet TAKE 1 TABLET BY MOUTH TWICE A DAY WITH A MEAL 180 tablet 1  . spironolactone (ALDACTONE) 25 MG tablet TAKE 1 TABLET BY MOUTH 2 TIMES DAILY. 180 tablet 2  . tadalafil (CIALIS) 5 MG tablet TAKE 1 TABLET BY MOUTH DAILY AS NEEDED FOR ERECTILE DYSFUNCTION 8 tablet 2   Current Facility-Administered Medications on File Prior to Visit  Medication Dose Route Frequency Provider Last Rate Last Admin  . acetaminophen (TYLENOL) tablet 650 mg  650 mg Oral Q4H PRN Gollan, Kathlene November, MD        BP (!) 118/58 (BP  Location: Left Arm, Patient Position: Sitting, Cuff Size: Normal)   Pulse 75   Temp (!) 96.4 F (35.8 C) (Temporal)   Wt 227 lb 4.8 oz (103.1 kg)   SpO2 97%   BMI 30.83 kg/m    Objective:   Physical Exam  Constitutional: He appears well-nourished.  Cardiovascular: Normal rate and regular rhythm.  Respiratory: Effort normal and breath sounds normal.  Musculoskeletal:     Cervical back: Neck supple.  Skin: Skin is warm and dry.  Psychiatric: He has a normal mood and affect.  Assessment & Plan:

## 2019-09-29 NOTE — Assessment & Plan Note (Signed)
A1C today of 7.9 which is an improvement from 9.8 in December 2020. Commended him on dietary changes, encouraged regular exercise.   Foot and eye exams UTD. Managed on statin and ARB. Pneumonia vaccination UTD.  Consider Trulicity in the future if needed. Continue Metformin and Glipizide.  Follow up in late June/early July for CPE.

## 2019-09-29 NOTE — Patient Instructions (Signed)
Continue Metformin and Glipizide for diabetes.  Continue to work on [employer]. Ensure you are consuming 64 ounces of water daily.  Start exercising. You should be getting 150 minutes of moderate intensity exercise weekly.  We will see you in late June/early July for your physical.   It was a pleasure to see you today!   Diabetes Mellitus and Nutrition, Adult When you have diabetes (diabetes mellitus), it is very important to have healthy eating habits because your blood sugar (glucose) levels are greatly affected by what you eat and drink. Eating healthy foods in the appropriate amounts, at about the same times every day, can help you:  Control your blood glucose.  Lower your risk of heart disease.  Improve your blood pressure.  Reach or maintain a healthy weight. Every person with diabetes is different, and each person has different needs for a meal plan. Your health care provider may recommend that you work with a diet and nutrition specialist (dietitian) to make a meal plan that is best for you. Your meal plan may vary depending on factors such as:  The calories you need.  The medicines you take.  Your weight.  Your blood glucose, blood pressure, and cholesterol levels.  Your activity level.  Other health conditions you have, such as heart or kidney disease. How do carbohydrates affect me? Carbohydrates, also called carbs, affect your blood glucose level more than any other type of food. Eating carbs naturally raises the amount of glucose in your blood. Carb counting is a method for keeping track of how many carbs you eat. Counting carbs is important to keep your blood glucose at a healthy level, especially if you use insulin or take certain oral diabetes medicines. It is important to know how many carbs you can safely have in each meal. This is different for every person. Your dietitian can help you calculate how many carbs you should have at each meal and for each  snack. Foods that contain carbs include:  Bread, cereal, rice, pasta, and crackers.  Potatoes and corn.  Peas, beans, and lentils.  Milk and yogurt.  Fruit and juice.  Desserts, such as cakes, cookies, ice cream, and candy. How does alcohol affect me? Alcohol can cause a sudden decrease in blood glucose (hypoglycemia), especially if you use insulin or take certain oral diabetes medicines. Hypoglycemia can be a life-threatening condition. Symptoms of hypoglycemia (sleepiness, dizziness, and confusion) are similar to symptoms of having too much alcohol. If your health care provider says that alcohol is safe for you, follow these guidelines:  Limit alcohol intake to no more than 1 drink per day for nonpregnant women and 2 drinks per day for men. One drink equals 12 oz of beer, 5 oz of wine, or 1 oz of hard liquor.  Do not drink on an empty stomach.  Keep yourself hydrated with water, diet soda, or unsweetened iced tea.  Keep in mind that regular soda, juice, and other mixers may contain a lot of sugar and must be counted as carbs. What are tips for following this plan?  Reading food labels  Start by checking the serving size on the "Nutrition Facts" label of packaged foods and drinks. The amount of calories, carbs, fats, and other nutrients listed on the label is based on one serving of the item. Many items contain more than one serving per package.  Check the total grams (g) of carbs in one serving. You can calculate the number of servings of carbs in  one serving by dividing the total carbs by 15. For example, if a food has 30 g of total carbs, it would be equal to 2 servings of carbs.  Check the number of grams (g) of saturated and trans fats in one serving. Choose foods that have low or no amount of these fats.  Check the number of milligrams (mg) of salt (sodium) in one serving. Most people should limit total sodium intake to less than 2,300 mg per day.  Always check the  nutrition information of foods labeled as "low-fat" or "nonfat". These foods may be higher in added sugar or refined carbs and should be avoided.  Talk to your dietitian to identify your daily goals for nutrients listed on the label. Shopping  Avoid buying canned, premade, or processed foods. These foods tend to be high in fat, sodium, and added sugar.  Shop around the outside edge of the grocery store. This includes fresh fruits and vegetables, bulk grains, fresh meats, and fresh dairy. Cooking  Use low-heat cooking methods, such as baking, instead of high-heat cooking methods like deep frying.  Cook using healthy oils, such as olive, canola, or sunflower oil.  Avoid cooking with butter, cream, or high-fat meats. Meal planning  Eat meals and snacks regularly, preferably at the same times every day. Avoid going long periods of time without eating.  Eat foods high in fiber, such as fresh fruits, vegetables, beans, and whole grains. Talk to your dietitian about how many servings of carbs you can eat at each meal.  Eat 4-6 ounces (oz) of lean protein each day, such as lean meat, chicken, fish, eggs, or tofu. One oz of lean protein is equal to: ? 1 oz of meat, chicken, or fish. ? 1 egg. ?  cup of tofu.  Eat some foods each day that contain healthy fats, such as avocado, nuts, seeds, and fish. Lifestyle  Check your blood glucose regularly.  Exercise regularly as told by your health care provider. This may include: ? 150 minutes of moderate-intensity or vigorous-intensity exercise each week. This could be brisk walking, biking, or water aerobics. ? Stretching and doing strength exercises, such as yoga or weightlifting, at least 2 times a week.  Take medicines as told by your health care provider.  Do not use any products that contain nicotine or tobacco, such as cigarettes and e-cigarettes. If you need help quitting, ask your health care provider.  Work with a Social worker or diabetes  educator to identify strategies to manage stress and any emotional and social challenges. Questions to ask a health care provider  Do I need to meet with a diabetes educator?  Do I need to meet with a dietitian?  What number can I call if I have questions?  When are the best times to check my blood glucose? Where to find more information:  American Diabetes Association: diabetes.org  Academy of Nutrition and Dietetics: www.eatright.[employer] of Diabetes and Digestive and Kidney Diseases (NIH): [employer] Summary  A healthy meal plan will help you control your blood glucose and maintain a healthy lifestyle.  Working with a diet and nutrition specialist (dietitian) can help you make a meal plan that is best for you.  Keep in mind that carbohydrates (carbs) and alcohol have immediate effects on your blood glucose levels. It is important to count carbs and to use alcohol carefully. This information is not intended to replace advice given to you by your health care provider. Make sure you discuss any  questions you have with your health care provider. Document Revised: 06/08/2017 Document Reviewed: 07/31/2016 Elsevier Patient Education  2020 [employer].

## 2019-12-03 ENCOUNTER — Other Ambulatory Visit: Payer: Self-pay | Admitting: Primary Care

## 2019-12-03 DIAGNOSIS — N529 Male erectile dysfunction, unspecified: Secondary | ICD-10-CM

## 2019-12-09 ENCOUNTER — Other Ambulatory Visit: Payer: Self-pay | Admitting: Primary Care

## 2019-12-09 DIAGNOSIS — E1159 Type 2 diabetes mellitus with other circulatory complications: Secondary | ICD-10-CM

## 2019-12-18 ENCOUNTER — Other Ambulatory Visit: Payer: Self-pay | Admitting: Primary Care

## 2019-12-18 DIAGNOSIS — I10 Essential (primary) hypertension: Secondary | ICD-10-CM

## 2019-12-18 DIAGNOSIS — E041 Nontoxic single thyroid nodule: Secondary | ICD-10-CM

## 2019-12-18 DIAGNOSIS — Z1159 Encounter for screening for other viral diseases: Secondary | ICD-10-CM

## 2019-12-18 DIAGNOSIS — E1159 Type 2 diabetes mellitus with other circulatory complications: Secondary | ICD-10-CM

## 2019-12-18 DIAGNOSIS — Z125 Encounter for screening for malignant neoplasm of prostate: Secondary | ICD-10-CM

## 2019-12-18 DIAGNOSIS — E782 Mixed hyperlipidemia: Secondary | ICD-10-CM

## 2019-12-31 ENCOUNTER — Other Ambulatory Visit: Payer: Self-pay

## 2020-01-01 ENCOUNTER — Other Ambulatory Visit (INDEPENDENT_AMBULATORY_CARE_PROVIDER_SITE_OTHER): Payer: Managed Care, Other (non HMO)

## 2020-01-01 DIAGNOSIS — Z125 Encounter for screening for malignant neoplasm of prostate: Secondary | ICD-10-CM | POA: Diagnosis not present

## 2020-01-01 DIAGNOSIS — I10 Essential (primary) hypertension: Secondary | ICD-10-CM | POA: Diagnosis not present

## 2020-01-01 DIAGNOSIS — E1159 Type 2 diabetes mellitus with other circulatory complications: Secondary | ICD-10-CM | POA: Diagnosis not present

## 2020-01-01 DIAGNOSIS — E041 Nontoxic single thyroid nodule: Secondary | ICD-10-CM

## 2020-01-01 DIAGNOSIS — E782 Mixed hyperlipidemia: Secondary | ICD-10-CM

## 2020-01-01 DIAGNOSIS — Z1159 Encounter for screening for other viral diseases: Secondary | ICD-10-CM

## 2020-01-01 LAB — CBC
HCT: 38.3 % — ABNORMAL LOW (ref 39.0–52.0)
Hemoglobin: 13.1 g/dL (ref 13.0–17.0)
MCHC: 34.2 g/dL (ref 30.0–36.0)
MCV: 86.7 fl (ref 78.0–100.0)
Platelets: 222 10*3/uL (ref 150.0–400.0)
RBC: 4.41 Mil/uL (ref 4.22–5.81)
RDW: 14.1 % (ref 11.5–15.5)
WBC: 7.8 10*3/uL (ref 4.0–10.5)

## 2020-01-01 LAB — LIPID PANEL
Cholesterol: 132 mg/dL (ref 0–200)
HDL: 26.5 mg/dL — ABNORMAL LOW (ref >39.00)
Total CHOL/HDL Ratio: 5
Triglycerides: 431 mg/dL — ABNORMAL HIGH (ref 0.0–149.0)

## 2020-01-01 LAB — COMPREHENSIVE METABOLIC PANEL
ALT: 24 U/L (ref 0–53)
AST: 13 U/L (ref 0–37)
Albumin: 4.3 g/dL (ref 3.5–5.2)
Alkaline Phosphatase: 64 U/L (ref 39–117)
BUN: 25 mg/dL — ABNORMAL HIGH (ref 6–23)
CO2: 27 mEq/L (ref 19–32)
Calcium: 9.6 mg/dL (ref 8.4–10.5)
Chloride: 99 mEq/L (ref 96–112)
Creatinine, Ser: 1.16 mg/dL (ref 0.40–1.50)
GFR: 65.55 mL/min (ref >60.00)
Glucose, Bld: 205 mg/dL — ABNORMAL HIGH (ref 70–99)
Potassium: 4.2 mEq/L (ref 3.5–5.1)
Sodium: 138 mEq/L (ref 135–145)
Total Bilirubin: 0.7 mg/dL (ref 0.2–1.2)
Total Protein: 6.5 g/dL (ref 6.0–8.3)

## 2020-01-01 LAB — PSA: PSA: 0.3 ng/mL (ref 0.10–4.00)

## 2020-01-01 LAB — HEMOGLOBIN A1C: Hgb A1c MFr Bld: 8.8 % — ABNORMAL HIGH (ref 4.6–6.5)

## 2020-01-01 LAB — TSH: TSH: 3.81 u[IU]/mL (ref 0.35–4.50)

## 2020-01-01 LAB — LDL CHOLESTEROL, DIRECT: Direct LDL: 51 mg/dL

## 2020-01-02 LAB — HEPATITIS C ANTIBODY
Hepatitis C Ab: NONREACTIVE
SIGNAL TO CUT-OFF: 0.01 (ref <1.00)

## 2020-01-08 ENCOUNTER — Ambulatory Visit (INDEPENDENT_AMBULATORY_CARE_PROVIDER_SITE_OTHER): Payer: Managed Care, Other (non HMO) | Admitting: Primary Care

## 2020-01-08 ENCOUNTER — Encounter: Payer: Self-pay | Admitting: Primary Care

## 2020-01-08 ENCOUNTER — Other Ambulatory Visit: Payer: Self-pay

## 2020-01-08 VITALS — BP 122/82 | HR 80 | Temp 96.2°F | Ht 72.0 in | Wt 228.5 lb

## 2020-01-08 DIAGNOSIS — Z23 Encounter for immunization: Secondary | ICD-10-CM

## 2020-01-08 DIAGNOSIS — I5022 Chronic systolic (congestive) heart failure: Secondary | ICD-10-CM

## 2020-01-08 DIAGNOSIS — I7121 Aneurysm of the ascending aorta, without rupture: Secondary | ICD-10-CM

## 2020-01-08 DIAGNOSIS — E041 Nontoxic single thyroid nodule: Secondary | ICD-10-CM

## 2020-01-08 DIAGNOSIS — E1159 Type 2 diabetes mellitus with other circulatory complications: Secondary | ICD-10-CM

## 2020-01-08 DIAGNOSIS — I712 Thoracic aortic aneurysm, without rupture: Secondary | ICD-10-CM | POA: Diagnosis not present

## 2020-01-08 DIAGNOSIS — E782 Mixed hyperlipidemia: Secondary | ICD-10-CM

## 2020-01-08 DIAGNOSIS — I10 Essential (primary) hypertension: Secondary | ICD-10-CM

## 2020-01-08 DIAGNOSIS — Z0000 Encounter for general adult medical examination without abnormal findings: Secondary | ICD-10-CM

## 2020-01-08 MED ORDER — TRULICITY 0.75 MG/0.5ML ~~LOC~~ SOAJ: 0.7500 mg | SUBCUTANEOUS | 2 refills | Status: DC

## 2020-01-08 NOTE — Assessment & Plan Note (Signed)
Well controlled in the office today, continue current regimen. CMP reviewed.  °

## 2020-01-08 NOTE — Addendum Note (Signed)
Addended by: Tawnya Crook on: 01/08/2020 10:11 AM   Modules accepted: Orders

## 2020-01-08 NOTE — Progress Notes (Signed)
Subjective:    Patient ID: Dennis Curry, male    DOB: Oct 30, 1965, 54 y.o.   MRN: [id number]  HPI  This visit occurred during the SARS-CoV-2 public health emergency.  Safety protocols were in place, including screening questions prior to the visit, additional usage of staff PPE, and extensive cleaning of exam room while observing appropriate contact time as indicated for disinfecting solutions.   Dennis Curry is a 54 year old male who presents today for complete physical. We will also need to treat his uncontrolled diabetes.   Immunizations: -Tetanus: Completed in 2003 -Influenza: Due this season  -Shingles: Never completed -Pneumonia: Completed in 2019 -Covid-19: Undecided   Diet: He endorses a fair diet.  Exercise: He is not exercising.  Eye exam: Due Dental exam: Completes semi-annually   Colonoscopy: Completed in 2017, due in 2027 PSA: 0.30 in 2021  BP Readings from Last 3 Encounters:  01/08/20 122/82  09/29/19 (!) 118/58  08/15/19 136/74   Wt Readings from Last 3 Encounters:  01/08/20 228 lb 8 oz (103.6 kg)  09/29/19 227 lb 4.8 oz (103.1 kg)  08/15/19 220 lb (99.8 kg)     Review of Systems  Constitutional: Negative for unexpected weight change.  HENT: Negative for rhinorrhea.   Respiratory: Negative for cough and shortness of breath.   Cardiovascular: Negative for chest pain.  Gastrointestinal: Negative for constipation and diarrhea.  Genitourinary: Negative for difficulty urinating.  Musculoskeletal: Negative for arthralgias and myalgias.  Skin: Negative for rash.  Allergic/Immunologic: Negative for environmental allergies.  Neurological: Negative for dizziness, numbness and headaches.  Psychiatric/Behavioral: The patient is not nervous/anxious.        Past Medical History:  Diagnosis Date  . Aortic insufficiency    a. TTE 11/30/14: EF 25-30%, bicupid valve cannot be excluded, mod-severe aortic regurge, aortic root dimension 52 mm, mild MR, mildly dilated  LA/RA, PASP 60; TEE 12/01/14: EF 25-30%, diffuse HK, aortic valve trileaflet, mild to mod Ao regurge, ascending aortic grossly measured at 4.5 cm (not well visualized),   . Ascending aorta dilatation (HCC)    a. 52 mm by TTE 11/2014; b. 45 mm by TEE 11/2014 (not well visualized)  . Chronic systolic CHF (congestive heart failure) (HCC)    a. TTE 11/30/14: EF 25-30%, bicupid valve cannot be excluded, mod-severe aortic regurge, aortic root dimension 52 mm, mild MR, mildly dilated LA/RA, PASP 60; TEE 12/01/14: EF 25-30%, diffuse HK, aortic valve trileaflet, mild to mod Ao regurge, ascending aortic grossly measured at 4.5 cm (not well visualized),   . Diabetes mellitus without complication (HCC)   . Hyperlipidemia   . Hypertension   . Influenza A 09/02/2018  . Nonischemic dilated cardiomyopathy (HCC)    a. cath 12/02/14: right dom coronary system w/ mild lumenal irregs, o/w no sig CAD, sev diffuse HK EF 25%, no sig MR or AS, med Rx recommeded      Social History   Socioeconomic History  . Marital status: Married    Spouse name: Not on file  . Number of children: Not on file  . Years of education: Not on file  . Highest education level: Not on file  Occupational History  . Not on file  Tobacco Use  . Smoking status: Never Smoker  . Smokeless tobacco: Never Used  Substance and Sexual Activity  . Alcohol use: No    Alcohol/week: 0.0 standard drinks  . Drug use: No  . Sexual activity: Not on file  Other Topics Concern  . Not on  file  Social History Narrative   Married.   Works as a Engineer, structural at [employer].   No children.   Enjoys playing guitar, traveling, spending time with his wife.   Social Determinants of Health   Financial Resource Strain:   . Difficulty of Paying Living Expenses:   Food Insecurity:   . Worried About Programme researcher, broadcasting/film/video in the Last Year:   . Barista in the Last Year:   Transportation Needs:   . Freight forwarder (Medical):   Marland Kitchen Lack of  Transportation (Non-Medical):   Physical Activity:   . Days of Exercise per Week:   . Minutes of Exercise per Session:   Stress:   . Feeling of Stress :   Social Connections:   . Frequency of Communication with Friends and Family:   . Frequency of Social Gatherings with Friends and Family:   . Attends Religious Services:   . Active Member of Clubs or Organizations:   . Attends Banker Meetings:   Marland Kitchen Marital Status:   Intimate Partner Violence:   . Fear of Current or Ex-Partner:   . Emotionally Abused:   Marland Kitchen Physically Abused:   . Sexually Abused:     Past Surgical History:  Procedure Laterality Date  . CARDIAC CATHETERIZATION N/A 12/02/2014   Procedure: Left Heart Cath and Coronary Angiography;  Surgeon: Antonieta Iba, MD;  Location: [facility] INVASIVE CV LAB;  Service: Cardiovascular;  Laterality: N/A;  . 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 CORONARY ANGIOPLASTY    . OTHER SURGICAL HISTORY     none    Family History  Problem Relation Age of Onset  . Coronary artery disease Mother   . Hypertension Father   . Diabetes Mellitus II Paternal Uncle   . Colon cancer Neg Hx     No Known Allergies  Current Outpatient Medications on File Prior to Visit  Medication Sig Dispense Refill  . aspirin 81 MG tablet Take 81 mg by mouth daily.    Marland Kitchen atorvastatin (LIPITOR) 40 MG tablet TAKE 1 TABLET BY MOUTH AT BEDTIME. 90 tablet 3  . carvedilol (COREG) 6.25 MG tablet TAKE 1 TABLET BY MOUTH 2 TIMES DAILY WITH A MEAL. 180 tablet 2  . furosemide (LASIX) 40 MG tablet TAKE 1 TABLET BY MOUTH EVERY DAY 90 tablet 2  . glipiZIDE (GLUCOTROL XL) 10 MG 24 hr tablet TAKE 1 TABLET BY MOUTH DAILY WITH BREAKFAST. FOR DIABETES. 90 tablet 3  . losartan (COZAAR) 100 MG tablet TAKE 1 TABLET BY MOUTH EVERY DAY 90 tablet 2  . metFORMIN (GLUCOPHAGE) 1000 MG tablet TAKE 1 TABLET BY MOUTH TWICE A DAY WITH A MEAL 180 tablet 1  . spironolactone (ALDACTONE) 25 MG tablet TAKE 1  TABLET BY MOUTH 2 TIMES DAILY. 180 tablet 2  . tadalafil (CIALIS) 5 MG tablet TAKE 1 TABLET BY MOUTH DAILY AS NEEDED FOR ERECTILE DYSFUNCTION 8 tablet 2   Current Facility-Administered Medications on File Prior to Visit  Medication Dose Route Frequency Provider Last Rate Last Admin  . acetaminophen (TYLENOL) tablet 650 mg  650 mg Oral Q4H PRN Gollan, Tollie Pizza, MD        BP 122/82   Pulse 80   Temp (!) 96.2 F (35.7 C) (Temporal)   Ht 6' (1.829 m)   Wt 228 lb 8 oz (103.6 kg)   SpO2 98%   BMI 30.99 kg/m    Objective:  Physical Exam HENT:     Right Ear: Tympanic membrane and ear canal normal.     Left Ear: Tympanic membrane and ear canal normal.  Eyes:     Pupils: Pupils are equal, round, and reactive to light.  Cardiovascular:     Rate and Rhythm: Normal rate and regular rhythm.  Pulmonary:     Effort: Pulmonary effort is normal.     Breath sounds: Normal breath sounds.  Abdominal:     General: Bowel sounds are normal.     Palpations: Abdomen is soft.     Tenderness: There is no abdominal tenderness.  Musculoskeletal:        General: Normal range of motion.     Cervical back: Neck supple.  Skin:    General: Skin is warm and dry.  Neurological:     Mental Status: He is alert and oriented to person, place, and time.     Cranial Nerves: No cranial nerve deficit.     Deep Tendon Reflexes:     Reflex Scores:      Patellar reflexes are 2+ on the right side and 2+ on the left side. Psychiatric:        Mood and Affect: Mood normal.            Assessment & Plan:

## 2020-01-08 NOTE — Patient Instructions (Addendum)
Start exercising. You should be getting 150 minutes of moderate intensity exercise weekly.  It is important that you improve your diet. Please limit carbohydrates in the form of white bread, rice, pasta, sweets, fast food, fried food, sugary drinks, etc. Increase your consumption of fresh fruits and vegetables, whole grains, lean protein.  Ensure you are consuming 64 ounces of water daily.  Start Trulicity 4.19 mg. Inject once weekly for diabetes.  Please schedule a follow up appointment in 3 months for diabetes check.  It was a pleasure to see you today!   Preventive Care 71-39 Years Old, Male Preventive care refers to lifestyle choices and visits with your health care provider that can promote health and wellness. This includes:  A yearly physical exam. This is also called an annual well check.  Regular dental and eye exams.  Immunizations.  Screening for certain conditions.  Healthy lifestyle choices, such as eating a healthy diet, getting regular exercise, not using drugs or products that contain nicotine and tobacco, and limiting alcohol use. What can I expect for my preventive care visit? Physical exam Your health care provider will check:  Height and weight. These may be used to calculate body mass index (BMI), which is a measurement that tells if you are at a healthy weight.  Heart rate and blood pressure.  Your skin for abnormal spots. Counseling Your health care provider may ask you questions about:  Alcohol, tobacco, and drug use.  Emotional well-being.  Home and relationship well-being.  Sexual activity.  Eating habits.  Work and work Statistician. What immunizations do I need?  Influenza (flu) vaccine  This is recommended every year. Tetanus, diphtheria, and pertussis (Tdap) vaccine  You may need a Td booster every 10 years. Varicella (chickenpox) vaccine  You may need this vaccine if you have not already been vaccinated. Zoster (shingles)  vaccine  You may need this after age 48. Measles, mumps, and rubella (MMR) vaccine  You may need at least one dose of MMR if you were born in 1957 or later. You may also need a second dose. Pneumococcal conjugate (PCV13) vaccine  You may need this if you have certain conditions and were not previously vaccinated. Pneumococcal polysaccharide (PPSV23) vaccine  You may need one or two doses if you smoke cigarettes or if you have certain conditions. Meningococcal conjugate (MenACWY) vaccine  You may need this if you have certain conditions. Hepatitis A vaccine  You may need this if you have certain conditions or if you travel or work in places where you may be exposed to hepatitis A. Hepatitis B vaccine  You may need this if you have certain conditions or if you travel or work in places where you may be exposed to hepatitis B. Haemophilus influenzae type b (Hib) vaccine  You may need this if you have certain risk factors. Human papillomavirus (HPV) vaccine  If recommended by your health care provider, you may need three doses over 6 months. You may receive vaccines as individual doses or as more than one vaccine together in one shot (combination vaccines). Talk with your health care provider about the risks and benefits of combination vaccines. What tests do I need? Blood tests  Lipid and cholesterol levels. These may be checked every 5 years, or more frequently if you are over 64 years old.  Hepatitis C test.  Hepatitis B test. Screening  Lung cancer screening. You may have this screening every year starting at age 17 if you have a 30-pack-year history  of smoking and currently smoke or have quit within the past 15 years.  Prostate cancer screening. Recommendations will vary depending on your family history and other risks.  Colorectal cancer screening. All adults should have this screening starting at age 55 and continuing until age 50. Your health care provider may recommend  screening at age 40 if you are at increased risk. You will have tests every 1-10 years, depending on your results and the type of screening test.  Diabetes screening. This is done by checking your blood sugar (glucose) after you have not eaten for a while (fasting). You may have this done every 1-3 years.  Sexually transmitted disease (STD) testing. Follow these instructions at home: Eating and drinking  Eat a diet that includes fresh fruits and vegetables, whole grains, lean protein, and low-fat dairy products.  Take vitamin and mineral supplements as recommended by your health care provider.  Do not drink alcohol if your health care provider tells you not to drink.  If you drink alcohol: ? Limit how much you have to 0-2 drinks a day. ? Be aware of how much alcohol is in your drink. In the [place] one drink equals one 12 oz bottle of beer (355 mL), one 5 oz glass of wine (148 mL), or one 1 oz glass of hard liquor (44 mL). Lifestyle  Take daily care of your teeth and gums.  Stay active. Exercise for at least 30 minutes on 5 or more days each week.  Do not use any products that contain nicotine or tobacco, such as cigarettes, e-cigarettes, and chewing tobacco. If you need help quitting, ask your health care provider.  If you are sexually active, practice safe sex. Use a condom or other form of protection to prevent STIs (sexually transmitted infections).  Talk with your health care provider about taking a low-dose aspirin every day starting at age 31. What's next?  Go to your health care provider once a year for a well check visit.  Ask your health care provider how often you should have your eyes and teeth checked.  Stay up to date on all vaccines. This information is not intended to replace advice given to you by your health care provider. Make sure you discuss any questions you have with your health care provider. Document Revised: 06/20/2018 Document Reviewed:  06/20/2018 Elsevier Patient Education  2020 [employer].

## 2020-01-08 NOTE — Assessment & Plan Note (Signed)
Uncontrolled with A1C of 8.8.  Goal A1C of <7 given CAD history.  Strongly advised he work on weight loss through diet and exercise.  Continue Metformin and Glipizide. Start Trulicity once weekly. 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 Rx sent to pharmacy.  Follow up in 3 months.

## 2020-01-08 NOTE — Assessment & Plan Note (Signed)
LDL at goal but trigs quite high. Strongly advised he work on weight loss through diet and exercise.   Continue statin therapy.

## 2020-01-08 NOTE — Assessment & Plan Note (Signed)
Due for repeat imaging this year, following with cardiology.

## 2020-01-08 NOTE — Assessment & Plan Note (Signed)
TSH unremarkable.  Due for repeat imaging in Fall 2022, if negative then imaging can be discontinued due to stability.

## 2020-01-08 NOTE — Assessment & Plan Note (Addendum)
Appears euvolemic today. Now on reduced dose of furosemide. Renal function stable and unremarkable from recent labs. Continue beta blocker.

## 2020-01-08 NOTE — Assessment & Plan Note (Signed)
Shingrix and tetanus provided today. PSA UTD. Colonoscopy UTD, due in 2027. Discussed the importance of a healthy diet and regular exercise in order for weight loss, and to reduce the risk of any potential medical problems.  Exam today benign. Labs reviewed.

## 2020-01-20 ENCOUNTER — Other Ambulatory Visit: Payer: Self-pay | Admitting: Cardiovascular Disease

## 2020-01-20 DIAGNOSIS — I10 Essential (primary) hypertension: Secondary | ICD-10-CM

## 2020-01-31 ENCOUNTER — Other Ambulatory Visit: Payer: Self-pay | Admitting: Cardiovascular Disease

## 2020-01-31 DIAGNOSIS — E785 Hyperlipidemia, unspecified: Secondary | ICD-10-CM

## 2020-02-24 LAB — HM DIABETES EYE EXAM

## 2020-02-27 ENCOUNTER — Encounter: Payer: Self-pay | Admitting: Primary Care

## 2020-03-08 ENCOUNTER — Other Ambulatory Visit: Payer: Self-pay | Admitting: Primary Care

## 2020-03-08 DIAGNOSIS — N529 Male erectile dysfunction, unspecified: Secondary | ICD-10-CM

## 2020-03-09 ENCOUNTER — Other Ambulatory Visit: Payer: Self-pay | Admitting: Primary Care

## 2020-03-09 DIAGNOSIS — E1165 Type 2 diabetes mellitus with hyperglycemia: Secondary | ICD-10-CM

## 2020-03-17 ENCOUNTER — Other Ambulatory Visit: Payer: Self-pay | Admitting: Primary Care

## 2020-03-17 DIAGNOSIS — E1159 Type 2 diabetes mellitus with other circulatory complications: Secondary | ICD-10-CM

## 2020-03-22 DIAGNOSIS — E1159 Type 2 diabetes mellitus with other circulatory complications: Secondary | ICD-10-CM

## 2020-03-22 MED ORDER — TRULICITY 0.75 MG/0.5ML ~~LOC~~ SOAJ: 0.7500 mg | SUBCUTANEOUS | 1 refills | Status: DC

## 2020-03-22 NOTE — Telephone Encounter (Signed)
Updated if ok to send in  Last app: 09/29/2019 F/u 04/09/2020

## 2020-03-23 NOTE — Telephone Encounter (Signed)
Dennis Curry, I sent for a 64-month refill, can you find out what is going on?

## 2020-03-24 ENCOUNTER — Other Ambulatory Visit: Payer: Self-pay | Admitting: *Deleted

## 2020-03-24 ENCOUNTER — Telehealth: Payer: Self-pay | Admitting: Cardiovascular Disease

## 2020-03-24 DIAGNOSIS — I77810 Thoracic aortic ectasia: Secondary | ICD-10-CM

## 2020-03-24 DIAGNOSIS — I5022 Chronic systolic (congestive) heart failure: Secondary | ICD-10-CM

## 2020-03-24 NOTE — Telephone Encounter (Signed)
Patient was told by Dr Mariah Milling to have an ECHO before his next yearly appointment  Patient is scheduled to see Dr Mariah Milling on 09/27 and ECHO on 09/21 - no order placed Please review and place order - advise if not needed

## 2020-03-24 NOTE — Addendum Note (Signed)
Addended by: Efrain Sella on: 03/24/2020 09:31 AM   Modules accepted: Orders

## 2020-03-25 NOTE — Telephone Encounter (Signed)
FYI,

## 2020-03-30 ENCOUNTER — Ambulatory Visit (INDEPENDENT_AMBULATORY_CARE_PROVIDER_SITE_OTHER): Payer: Managed Care, Other (non HMO)

## 2020-03-30 ENCOUNTER — Other Ambulatory Visit: Payer: Self-pay

## 2020-03-30 DIAGNOSIS — I77810 Thoracic aortic ectasia: Secondary | ICD-10-CM

## 2020-03-30 LAB — ECHOCARDIOGRAM COMPLETE
AR max vel: 4.27 cm2
AV Area VTI: 4.24 cm2
AV Area mean vel: 3.81 cm2
AV Mean grad: 4 mmHg
AV Peak grad: 6.8 mmHg
Ao pk vel: 1.3 m/s
Area-P 1/2: 2.39 cm2
Calc EF: 46.2 %
P 1/2 time: 495 msec
S' Lateral: 4.5 cm
Single Plane A2C EF: 42.4 %
Single Plane A4C EF: 47.9 %

## 2020-03-31 ENCOUNTER — Other Ambulatory Visit: Payer: Self-pay | Admitting: *Deleted

## 2020-03-31 DIAGNOSIS — I77810 Thoracic aortic ectasia: Secondary | ICD-10-CM

## 2020-04-04 NOTE — Progress Notes (Signed)
Cardiology Office Note  Date:  04/05/2020   ID:  Dennis Curry, DOB 01-17-1966, MRN [id number]  PCP:  [name], NP   Chief Complaint  Patient presents with  . Other    12 month follow up. Meds reviewed verbally with patient.     HPI:  54 year old gentleman with PMH of DM II Nonischemic cardiomyopathy EF 25% , up to 45 to 50% in 2016, 50 to 55%  In 2017 No CAD on cardiac cath, 2016 Aortic root 50 mm, ascending aorta 45 mm, unchanged dating back to 2016 Aortic valve regurgitation is moderate. , trileaflet aortic valve  who presents for routine follow-up of his systolic CHF and dilated ascending aorta  Last seen in clinic September 2020 HBA1C 8.8 up from 6.6 in in 04/2018 On trulicity Weight 229 to 209  Labs reviewed LDL 51,  Total chol 132,  No chest pain, SOB  Echocardiogram September 2021 Ejection fraction 45 to 50%, LV mildly dilated, Report of asymmetric hypertrophy of the posterior lateral wall,  Aortic root 50 mm, ascending aorta 45 mm, unchanged compared to 2019 Compared to echo 2019 at that time ejection fraction 50 to 55% Measurements aortic root 50 mm, ascending aorta 45 Measurements are also unchanged compared to October 2017  EKG personally reviewed by myself on todays visit Shows nsr rate 78 bpm, RBBB, LAFB  Other past medical history reviewed  CT chest: October 2019 1. Stable aneurysmal disease of the thoracic aorta with aortic root diameter of approximately 4.7-4.8 cm at the sinuses of Valsalva and maximal ascending thoracic aorta diameter of 4.5 cm. 2. Coronary atherosclerosis with calcified plaque in the distribution of the LAD and RCA. 3. Aneurysmal disease of the ascending thoracic aorta does exert some mass effect on the SVC causing some apparent narrowing of the distal SVC by CT.   TEE 2015 4.5 aorta ascending Aorta Root 4.4  Echo TTE 2016  Ascending  aorta 4.5   Initially developed progressive chest congestion initially felt  to be allergies, treated with several rounds of ABX,  presenting to the hospital with worsening shortness of breath found to have acute systolic CHF, EF 78%, dilated ascending aorta, cardiac catheterization showing no significant CAD, aorta 4.5 cm on TEE,  PMH:   has a past medical history of Aortic insufficiency, Ascending aorta dilatation (HCC), Chronic systolic CHF (congestive heart failure) (HCC), Diabetes mellitus without complication (HCC), Hyperlipidemia, Hypertension, Influenza A (09/02/2018), and Nonischemic dilated cardiomyopathy (HCC).  PSH:    Past Surgical History:  Procedure Laterality Date  . CARDIAC CATHETERIZATION N/A 12/02/2014   Procedure: Left Heart Cath and Coronary Angiography;  Surgeon: Antonieta Iba, MD;  Location: [facility] INVASIVE CV LAB;  Service: Cardiovascular;  Laterality: N/A;  . 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 CORONARY ANGIOPLASTY    . OTHER SURGICAL HISTORY     none    Current Outpatient Medications  Medication Sig Dispense Refill  . aspirin 81 MG tablet Take 81 mg by mouth daily.    Marland Kitchen atorvastatin (LIPITOR) 40 MG tablet TAKE 1 TABLET BY MOUTH AT BEDTIME. 90 tablet 0  . carvedilol (COREG) 6.25 MG tablet TAKE 1 TABLET BY MOUTH 2 TIMES DAILY WITH A MEAL. 180 tablet 0  . Dulaglutide (TRULICITY) 0.75 MG/0.5ML SOPN Inject 0.75 mg into the skin once a week. For diabetes. 6 mL 1  . furosemide (LASIX) 40 MG tablet TAKE 1 TABLET BY MOUTH EVERY DAY 90 tablet 2  .  glipiZIDE (GLUCOTROL XL) 10 MG 24 hr tablet TAKE 1 TABLET BY MOUTH DAILY WITH BREAKFAST. FOR DIABETES. 90 tablet 3  . losartan (COZAAR) 100 MG tablet TAKE 1 TABLET BY MOUTH EVERY DAY 90 tablet 0  . metFORMIN (GLUCOPHAGE) 1000 MG tablet TAKE 1 TABLET BY MOUTH TWICE A DAY WITH A MEAL 180 tablet 1  . spironolactone (ALDACTONE) 25 MG tablet TAKE 1 TABLET BY MOUTH 2 TIMES DAILY. 180 tablet 0  . tadalafil (CIALIS) 5 MG tablet TAKE 1 TABLET BY MOUTH DAILY AS NEEDED FOR ERECTILE  DYSFUNCTION 8 tablet 2   No current facility-administered medications for this visit.   Facility-Administered Medications Ordered in Other Visits  Medication Dose Route Frequency Provider Last Rate Last Admin  . acetaminophen (TYLENOL) tablet 650 mg  650 mg Oral Q4H PRN Antonieta Iba, MD         Allergies:   Patient has no known allergies.   Social History:  The patient  reports that he has never smoked. He has never used smokeless tobacco. He reports that he does not drink alcohol and does not use drugs.   Family History:   family history includes Coronary artery disease in his mother; Diabetes Mellitus II in his paternal uncle; Hypertension in his father.    Review of Systems: Review of Systems  Constitutional: Negative.   Respiratory: Negative.   Cardiovascular: Negative.   Gastrointestinal: Negative.   Musculoskeletal: Negative.   Neurological: Negative.   Psychiatric/Behavioral: Negative.   All other systems reviewed and are negative.    PHYSICAL EXAM: VS:  BP 122/66 (BP Location: Left Arm, Patient Position: Sitting, Cuff Size: Normal)   Pulse 78   Ht 6' (1.829 m)   Wt 209 lb 4 oz (94.9 kg)   SpO2 97%   BMI 28.38 kg/m  , BMI Body mass index is 28.38 kg/m. Constitutional:  oriented to person, place, and time. No distress.  HENT:  Head: Grossly normal Eyes:  no discharge. No scleral icterus.  Neck: No JVD, no carotid bruits  Cardiovascular: Regular rate and rhythm, no murmurs appreciated Pulmonary/Chest: Clear to auscultation bilaterally, no wheezes or rails Abdominal: Soft.  no distension.  no tenderness.  Musculoskeletal: Normal range of motion Neurological:  normal muscle tone. Coordination normal. No atrophy Skin: Skin warm and dry Psychiatric: normal affect, pleasant   Recent Labs: 01/01/2020: ALT 24; BUN 25; Creatinine, Ser 1.16; Hemoglobin 13.1; Platelets 222.0; Potassium 4.2; Sodium 138; TSH 3.81    Lipid Panel Lab Results  Component Value Date    CHOL 132 01/01/2020   HDL 26.50 (L) 01/01/2020   TRIG (H) 01/01/2020    431.0 Triglyceride is over 400; calculations on Lipids are invalid.      Wt Readings from Last 3 Encounters:  04/05/20 209 lb 4 oz (94.9 kg)  01/08/20 228 lb 8 oz (103.6 kg)  09/29/19 227 lb 4.8 oz (103.1 kg)       ASSESSMENT AND PLAN:  Ascending aorta dilation (HCC) - Previous results from 2015, 2016, 2017 2018 reviewed He would like to repeat echocardiogram 2019  CT scan 2017 and 2019 Stable 4.5 cm  Repeat scan next year  systolic CHF (congestive heart failure) (HCC) -   ejection fraction 50-55% last year 2018 stable  Aortic valve disorder -  trileaflet on transesophageal echo  Essential hypertension -  Blood pressure is well controlled on today's visit. No changes made to the medications.  Nonischemic dilated cardiomyopathy (HCC) -  Dramatic improvement in ejection fraction over  the past several years, stable  Aneurysm, ascending aorta (HCC) Discussed, stable   Total encounter time more than 25 minutes  Greater than 50% was spent in counseling and coordination of care with the patient   Disposition:   F/U  12 months   No orders of the defined types were placed in this encounter.    Signed, Dossie Arbour, M.D., Ph.D. 04/05/2020  [facility] [place], [place] [phone]

## 2020-04-05 ENCOUNTER — Encounter: Payer: Self-pay | Admitting: Cardiovascular Disease

## 2020-04-05 ENCOUNTER — Other Ambulatory Visit: Payer: Self-pay | Admitting: Cardiovascular Disease

## 2020-04-05 ENCOUNTER — Ambulatory Visit: Payer: Managed Care, Other (non HMO) | Admitting: Cardiovascular Disease

## 2020-04-05 ENCOUNTER — Other Ambulatory Visit: Payer: Self-pay

## 2020-04-05 VITALS — BP 122/66 | HR 78 | Ht 72.0 in | Wt 209.2 lb

## 2020-04-05 DIAGNOSIS — I5022 Chronic systolic (congestive) heart failure: Secondary | ICD-10-CM | POA: Diagnosis not present

## 2020-04-05 DIAGNOSIS — I420 Dilated cardiomyopathy: Secondary | ICD-10-CM

## 2020-04-05 DIAGNOSIS — I359 Nonrheumatic aortic valve disorder, unspecified: Secondary | ICD-10-CM

## 2020-04-05 DIAGNOSIS — I77810 Thoracic aortic ectasia: Secondary | ICD-10-CM

## 2020-04-05 DIAGNOSIS — I10 Essential (primary) hypertension: Secondary | ICD-10-CM

## 2020-04-05 DIAGNOSIS — E1159 Type 2 diabetes mellitus with other circulatory complications: Secondary | ICD-10-CM

## 2020-04-05 DIAGNOSIS — E782 Mixed hyperlipidemia: Secondary | ICD-10-CM

## 2020-04-05 MED ORDER — FUROSEMIDE 40 MG PO TABS
40.0000 mg | ORAL_TABLET | Freq: Every day | ORAL | 2 refills | Status: DC
Start: 2020-04-05 — End: 2020-12-02

## 2020-04-05 NOTE — Patient Instructions (Signed)
Medication Instructions:  No changes  If you need a refill on your cardiac medications before your next appointment, please call your pharmacy.    Lab work: No new labs needed   If you have labs (blood work) drawn today and your tests are completely normal, you will receive your results only by: . MyChart Message (if you have MyChart) OR . A paper copy in the mail If you have any lab test that is abnormal or we need to change your treatment, we will call you to review the results.   Testing/Procedures: No new testing needed   Follow-Up: At [facility], you and your health needs are our priority.  As part of our continuing mission to provide you with exceptional heart care, we have created designated Provider Care Teams.  These Care Teams include your primary Cardiologist (physician) and Advanced Practice Providers (APPs -  Physician Assistants and Nurse Practitioners) who all work together to provide you with the care you need, when you need it.  . You will need a follow up appointment in 12 months  . Providers on your designated Care Team:   . Christopher Berge, NP . Ryan Dunn, PA-C . Jacquelyn Visser, PA-C  Any Other Special Instructions Will Be Listed Below (If Applicable).  COVID-19 Vaccine Information can be found at: [email or web address] For questions related to vaccine distribution or appointments, please email [email or web address] or call [phone].     

## 2020-04-08 NOTE — Addendum Note (Signed)
Addended by: Margrett Rud on: 04/08/2020 08:45 AM   Modules accepted: Orders

## 2020-04-09 ENCOUNTER — Ambulatory Visit (INDEPENDENT_AMBULATORY_CARE_PROVIDER_SITE_OTHER): Payer: Managed Care, Other (non HMO) | Admitting: Primary Care

## 2020-04-09 ENCOUNTER — Other Ambulatory Visit: Payer: Self-pay

## 2020-04-09 ENCOUNTER — Encounter: Payer: Self-pay | Admitting: Primary Care

## 2020-04-09 VITALS — BP 124/62 | HR 97 | Temp 98.0°F | Ht 72.0 in | Wt 209.0 lb

## 2020-04-09 DIAGNOSIS — E1159 Type 2 diabetes mellitus with other circulatory complications: Secondary | ICD-10-CM

## 2020-04-09 LAB — POCT GLYCOSYLATED HEMOGLOBIN (HGB A1C): Hemoglobin A1C: 5.8 % — AB (ref 4.0–5.6)

## 2020-04-09 NOTE — Progress Notes (Signed)
p 

## 2020-04-09 NOTE — Progress Notes (Signed)
Subjective:    Patient ID: Dennis Curry, male    DOB: Nov 08, 1965, 54 y.o.   MRN: [id number]  HPI  This visit occurred during the SARS-CoV-2 public health emergency.  Safety protocols were in place, including screening questions prior to the visit, additional usage of staff PPE, and extensive cleaning of exam room while observing appropriate contact time as indicated for disinfecting solutions.   Dennis Curry is a 54 year old male with a history of CHF, hypertension, type 2 diabetes, hyperlipidemia, aortic aneurysm who presents today for follow up of diabetes.  Current medications include: Trulicity 0.75 mg weekly, Glipizide XL 10 mg, Metformin 1000 mg BID.   He is checking his blood glucose 1 times daily and is getting readings of:  AM fasting: 120's on average  Last A1C: 8.8 in June 2021, 5.8 today Last Eye Exam: UTD Last Foot Exam: UTD Pneumonia Vaccination: Completed in 2019 ACE/ARB: losartan  Statin: atorvastatin   Since his last visit he's changed his diet, mostly eating vegetables, lean protein, very [place] starch. He is walking some.   BP Readings from Last 3 Encounters:  04/09/20 124/62  04/05/20 122/66  01/08/20 122/82   Wt Readings from Last 3 Encounters:  04/09/20 209 lb (94.8 kg)  04/05/20 209 lb 4 oz (94.9 kg)  01/08/20 228 lb 8 oz (103.6 kg)       Review of Systems  Eyes: Negative for visual disturbance.  Respiratory: Negative for shortness of breath.   Cardiovascular: Negative for chest pain.  Neurological: Negative for dizziness and headaches.       Past Medical History:  Diagnosis Date  . Aortic insufficiency    a. TTE 11/30/14: EF 25-30%, bicupid valve cannot be excluded, mod-severe aortic regurge, aortic root dimension 52 mm, mild MR, mildly dilated LA/RA, PASP 60; TEE 12/01/14: EF 25-30%, diffuse HK, aortic valve trileaflet, mild to mod Ao regurge, ascending aortic grossly measured at 4.5 cm (not well visualized),   . Ascending aorta dilatation  (HCC)    a. 52 mm by TTE 11/2014; b. 45 mm by TEE 11/2014 (not well visualized)  . Chronic systolic CHF (congestive heart failure) (HCC)    a. TTE 11/30/14: EF 25-30%, bicupid valve cannot be excluded, mod-severe aortic regurge, aortic root dimension 52 mm, mild MR, mildly dilated LA/RA, PASP 60; TEE 12/01/14: EF 25-30%, diffuse HK, aortic valve trileaflet, mild to mod Ao regurge, ascending aortic grossly measured at 4.5 cm (not well visualized),   . Diabetes mellitus without complication (HCC)   . Hyperlipidemia   . Hypertension   . Influenza A 09/02/2018  . Nonischemic dilated cardiomyopathy (HCC)    a. cath 12/02/14: right dom coronary system w/ mild lumenal irregs, o/w no sig CAD, sev diffuse HK EF 25%, no sig MR or AS, med Rx recommeded      Social History   Socioeconomic History  . Marital status: Married    Spouse name: Not on file  . Number of children: Not on file  . Years of education: Not on file  . Highest education level: Not on file  Occupational History  . Not on file  Tobacco Use  . Smoking status: Never Smoker  . Smokeless tobacco: Never Used  Substance and Sexual Activity  . Alcohol use: No    Alcohol/week: 0.0 standard drinks  . Drug use: No  . Sexual activity: Not on file  Other Topics Concern  . Not on file  Social History Narrative   Married.   Works  as a Engineer, structural at [employer].   No children.   Enjoys playing guitar, traveling, spending time with his wife.   Social Determinants of Health   Financial Resource Strain:   . Difficulty of Paying Living Expenses: Not on file  Food Insecurity:   . Worried About Programme researcher, broadcasting/film/video in the Last Year: Not on file  . Ran Out of Food in the Last Year: Not on file  Transportation Needs:   . Lack of Transportation (Medical): Not on file  . Lack of Transportation (Non-Medical): Not on file  Physical Activity:   . Days of Exercise per Week: Not on file  . Minutes of Exercise per Session: Not on file  Stress:     . Feeling of Stress : Not on file  Social Connections:   . Frequency of Communication with Friends and Family: Not on file  . Frequency of Social Gatherings with Friends and Family: Not on file  . Attends Religious Services: Not on file  . Active Member of Clubs or Organizations: Not on file  . Attends Banker Meetings: Not on file  . Marital Status: Not on file  Intimate Partner Violence:   . Fear of Current or Ex-Partner: Not on file  . Emotionally Abused: Not on file  . Physically Abused: Not on file  . Sexually Abused: Not on file    Past Surgical History:  Procedure Laterality Date  . CARDIAC CATHETERIZATION N/A 12/02/2014   Procedure: Left Heart Cath and Coronary Angiography;  Surgeon: Antonieta Iba, MD;  Location: [facility]IVE [facility];  Service: Cardiovascular;  Laterality: N/A;  . 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 CORONARY ANGIOPLASTY    . OTHER SURGICAL HISTORY     none    Family History  Problem Relation Age of Onset  . Coronary artery disease Mother   . Hypertension Father   . Diabetes Mellitus II Paternal Uncle   . Colon cancer Neg Hx     No Known Allergies  Current Outpatient Medications on File Prior to Visit  Medication Sig Dispense Refill  . aspirin 81 MG tablet Take 81 mg by mouth daily.    Marland Kitchen atorvastatin (LIPITOR) 40 MG tablet TAKE 1 TABLET BY MOUTH AT BEDTIME. 90 tablet 0  . carvedilol (COREG) 6.25 MG tablet TAKE 1 TABLET BY MOUTH 2 TIMES DAILY WITH A MEAL. 180 tablet 3  . Dulaglutide (TRULICITY) 0.75 MG/0.5ML SOPN Inject 0.75 mg into the skin once a week. For diabetes. 6 mL 1  . furosemide (LASIX) 40 MG tablet Take 1 tablet (40 mg total) by mouth daily. 90 tablet 2  . glipiZIDE (GLUCOTROL XL) 10 MG 24 hr tablet TAKE 1 TABLET BY MOUTH DAILY WITH BREAKFAST. FOR DIABETES. 90 tablet 3  . losartan (COZAAR) 100 MG tablet TAKE 1 TABLET BY MOUTH EVERY DAY 90 tablet 0  . metFORMIN (GLUCOPHAGE) 1000 MG tablet TAKE  1 TABLET BY MOUTH TWICE A DAY WITH A MEAL 180 tablet 1  . spironolactone (ALDACTONE) 25 MG tablet TAKE 1 TABLET BY MOUTH 2 TIMES DAILY. 180 tablet 3  . tadalafil (CIALIS) 5 MG tablet TAKE 1 TABLET BY MOUTH DAILY AS NEEDED FOR ERECTILE DYSFUNCTION 8 tablet 2   Current Facility-Administered Medications on File Prior to Visit  Medication Dose Route Frequency Provider Last Rate Last Admin  . acetaminophen (TYLENOL) tablet 650 mg  650 mg Oral Q4H PRN Antonieta Iba, MD  BP 124/62   Pulse 97   Temp 98 F (36.7 C) (Temporal)   Ht 6' (1.829 m)   Wt 209 lb (94.8 kg)   SpO2 97%   BMI 28.35 kg/m    Objective:   Physical Exam Cardiovascular:     Rate and Rhythm: Normal rate and regular rhythm.  Pulmonary:     Effort: Pulmonary effort is normal.     Breath sounds: Normal breath sounds.  Musculoskeletal:     Cervical back: Neck supple.  Skin:    General: Skin is warm and dry.            Assessment & Plan:

## 2020-04-09 NOTE — Assessment & Plan Note (Signed)
Significant improvement in A1C, down from 8.8 to 5.8! Commended him on this improvement. Encouraged continued weight loss through diet and exercise.  He will continue to monitor glucose levels and notify if levels drop consistently below 80. Consider removing Glipizide at that time.   Continue Metformin, Trulicity, and Glipizide for now.   Follow up in 6 months.

## 2020-04-09 NOTE — Patient Instructions (Signed)
Start exercising. You should be getting 150 minutes of moderate intensity exercise weekly.  Continue to work on a healthy diet. Ensure you are consuming 64 ounces of water daily.  Please schedule a follow up appointment in 6 months for diabetes check.  It was a pleasure to see you today!

## 2020-05-04 ENCOUNTER — Other Ambulatory Visit: Payer: Self-pay | Admitting: Cardiovascular Disease

## 2020-05-04 DIAGNOSIS — E785 Hyperlipidemia, unspecified: Secondary | ICD-10-CM

## 2020-05-11 ENCOUNTER — Ambulatory Visit (INDEPENDENT_AMBULATORY_CARE_PROVIDER_SITE_OTHER): Payer: Managed Care, Other (non HMO)

## 2020-05-11 ENCOUNTER — Other Ambulatory Visit: Payer: Self-pay

## 2020-05-11 DIAGNOSIS — Z23 Encounter for immunization: Secondary | ICD-10-CM | POA: Diagnosis not present

## 2020-05-23 ENCOUNTER — Other Ambulatory Visit: Payer: Self-pay | Admitting: Primary Care

## 2020-05-23 DIAGNOSIS — N529 Male erectile dysfunction, unspecified: Secondary | ICD-10-CM

## 2020-05-24 NOTE — Telephone Encounter (Signed)
LAST APPOINTMENT DATE: 04/09/2020  NEXT APPOINTMENT DATE: no f/u    LAST REFILL: 03/08/2020 QTY: #8  rf 2

## 2020-05-25 NOTE — Telephone Encounter (Signed)
Is he already needing a refill? How often is he taking?

## 2020-05-25 NOTE — Telephone Encounter (Signed)
Called patient will need refill soon is taking one every 3 days.

## 2020-05-25 NOTE — Telephone Encounter (Signed)
Refill sent to pharmacy.   

## 2020-05-25 NOTE — Telephone Encounter (Signed)
Left message to return call to our office.  

## 2020-07-18 ENCOUNTER — Other Ambulatory Visit: Payer: Self-pay

## 2020-07-18 ENCOUNTER — Other Ambulatory Visit: Payer: Managed Care, Other (non HMO)

## 2020-07-18 DIAGNOSIS — Z20822 Contact with and (suspected) exposure to covid-19: Secondary | ICD-10-CM

## 2020-07-19 LAB — SARS-COV-2, NAA 2 DAY TAT

## 2020-07-19 LAB — NOVEL CORONAVIRUS, NAA: SARS-CoV-2, NAA: NOT DETECTED

## 2020-08-08 ENCOUNTER — Other Ambulatory Visit: Payer: Self-pay | Admitting: Primary Care

## 2020-08-08 DIAGNOSIS — E1165 Type 2 diabetes mellitus with hyperglycemia: Secondary | ICD-10-CM

## 2020-08-12 ENCOUNTER — Other Ambulatory Visit: Payer: Self-pay | Admitting: Primary Care

## 2020-08-12 DIAGNOSIS — E1159 Type 2 diabetes mellitus with other circulatory complications: Secondary | ICD-10-CM

## 2020-08-12 NOTE — Telephone Encounter (Signed)
Pharmacy requests refill on: Trulicity 0.75 mg/0.5 mL  LAST REFILL: 03/22/2020 (Q-6 mL, R-1) LAST OV: 04/09/2020 NEXT OV: 10/14/2020 PHARMACY: [id number] [place], [place]

## 2020-08-27 ENCOUNTER — Other Ambulatory Visit: Payer: Self-pay | Admitting: Primary Care

## 2020-08-27 DIAGNOSIS — E1159 Type 2 diabetes mellitus with other circulatory complications: Secondary | ICD-10-CM

## 2020-08-30 ENCOUNTER — Other Ambulatory Visit: Payer: Self-pay | Admitting: Primary Care

## 2020-08-30 DIAGNOSIS — N529 Male erectile dysfunction, unspecified: Secondary | ICD-10-CM

## 2020-08-31 NOTE — Telephone Encounter (Signed)
LAST APPOINTMENT DATE: 04/09/2020    NEXT APPOINTMENT DATE: 10/15/2020    LAST REFILL: 05/25/2020  QTY: 30 no rf

## 2020-09-01 NOTE — Telephone Encounter (Signed)
Refill sent to pharmacy.   

## 2020-10-14 ENCOUNTER — Ambulatory Visit: Payer: Managed Care, Other (non HMO) | Admitting: Primary Care

## 2020-10-15 ENCOUNTER — Encounter: Payer: Self-pay | Admitting: Primary Care

## 2020-10-15 ENCOUNTER — Ambulatory Visit: Payer: Managed Care, Other (non HMO) | Admitting: Primary Care

## 2020-10-15 ENCOUNTER — Other Ambulatory Visit: Payer: Self-pay

## 2020-10-15 VITALS — BP 126/62 | HR 67 | Temp 98.2°F | Ht 72.0 in | Wt 211.0 lb

## 2020-10-15 DIAGNOSIS — E1159 Type 2 diabetes mellitus with other circulatory complications: Secondary | ICD-10-CM | POA: Diagnosis not present

## 2020-10-15 LAB — POCT GLYCOSYLATED HEMOGLOBIN (HGB A1C): Hemoglobin A1C: 5.7 % — AB (ref 4.0–5.6)

## 2020-10-15 NOTE — Patient Instructions (Signed)
Continue to work on a healthy diet!  We will see you around August for your physical, please schedule this up front.  It was a pleasure to see you today!

## 2020-10-15 NOTE — Assessment & Plan Note (Signed)
Well controlled with A1C of 5.7 today! Given history of heart disease and stable glucose readings, will continue his current regimen of Metformin 1000 mg BID, Trulicity 0.75 mg weekly, Glipizide XL 10 mg.   Foot and eye exam UTD. Pneumonia vaccine UTD. Managed on statin and ARB.  Follow up in 3-6 months for CPE.

## 2020-10-15 NOTE — Progress Notes (Signed)
Subjective:    Patient ID: Dennis Curry, male    DOB: July 28, 1965, 55 y.o.   MRN: [id number]  HPI  Dennis Curry is a very pleasant 55 y.o. male with a history of CHF, hypertension, CAD, aortic aneurysm, type 2 diabetes, thyroid nodule who presents today for follow up of diabetes.  Current medications include: Glipizide XL 10 mg, Metformin 1000 mg BID,  Trulicity 0.75 mg weekly.    He is checking his blood glucose 1 times daily and is getting readings of low 100's.  Last A1C: 5.8 in October 2021, 5.7 today Last Eye Exam: UTD Last Foot Exam: UTD Pneumonia Vaccination: 2019 ACE/ARB: losartan  Statin: atorvastatin    BP Readings from Last 3 Encounters:  10/15/20 126/62  04/09/20 124/62  04/05/20 122/66     Review of Systems  Eyes: Negative for visual disturbance.  Respiratory: Negative for shortness of breath.   Cardiovascular: Negative for chest pain.  Neurological: Negative for numbness.         Past Medical History:  Diagnosis Date  . Aortic insufficiency    a. TTE 11/30/14: EF 25-30%, bicupid valve cannot be excluded, mod-severe aortic regurge, aortic root dimension 52 mm, mild MR, mildly dilated LA/RA, PASP 60; TEE 12/01/14: EF 25-30%, diffuse HK, aortic valve trileaflet, mild to mod Ao regurge, ascending aortic grossly measured at 4.5 cm (not well visualized),   . Ascending aorta dilatation (HCC)    a. 52 mm by TTE 11/2014; b. 45 mm by TEE 11/2014 (not well visualized)  . Chronic systolic CHF (congestive heart failure) (HCC)    a. TTE 11/30/14: EF 25-30%, bicupid valve cannot be excluded, mod-severe aortic regurge, aortic root dimension 52 mm, mild MR, mildly dilated LA/RA, PASP 60; TEE 12/01/14: EF 25-30%, diffuse HK, aortic valve trileaflet, mild to mod Ao regurge, ascending aortic grossly measured at 4.5 cm (not well visualized),   . Diabetes mellitus without complication (HCC)   . Hyperlipidemia   . Hypertension   . Influenza A 09/02/2018  . Nonischemic dilated  cardiomyopathy (HCC)    a. cath 12/02/14: right dom coronary system w/ mild lumenal irregs, o/w no sig CAD, sev diffuse HK EF 25%, no sig MR or AS, med Rx recommeded     Social History   Socioeconomic History  . Marital status: Married    Spouse name: Not on file  . Number of children: Not on file  . Years of education: Not on file  . Highest education level: Not on file  Occupational History  . Not on file  Tobacco Use  . Smoking status: Never Smoker  . Smokeless tobacco: Never Used  Substance and Sexual Activity  . Alcohol use: No    Alcohol/week: 0.0 standard drinks  . Drug use: No  . Sexual activity: Not on file  Other Topics Concern  . Not on file  Social History Narrative   Married.   Works as a Engineer, structural at [employer].   No children.   Enjoys playing guitar, traveling, spending time with his wife.   Social Determinants of Health   Financial Resource Strain: Not on file  Food Insecurity: Not on file  Transportation Needs: Not on file  Physical Activity: Not on file  Stress: Not on file  Social Connections: Not on file  Intimate Partner Violence: Not on file    Past Surgical History:  Procedure Laterality Date  . CARDIAC CATHETERIZATION N/A 12/02/2014   Procedure: Left Heart Cath and Coronary Angiography;  Surgeon: Tollie Pizza  Mariah Milling, MD;  Location: [facility] INVASIVE CV [facility];  Service: Cardiovascular;  Laterality: N/A;  . 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 CORONARY ANGIOPLASTY    . OTHER SURGICAL HISTORY     none    Family History  Problem Relation Age of Onset  . Coronary artery disease Mother   . Hypertension Father   . Diabetes Mellitus II Paternal Uncle   . Colon cancer Neg Hx     No Known Allergies  Current Outpatient Medications on File Prior to Visit  Medication Sig Dispense Refill  . aspirin 81 MG tablet Take 81 mg by mouth daily.    Marland Kitchen atorvastatin (LIPITOR) 40 MG tablet TAKE 1 TABLET BY MOUTH AT BEDTIME. 90 tablet  3  . carvedilol (COREG) 6.25 MG tablet TAKE 1 TABLET BY MOUTH 2 TIMES DAILY WITH A MEAL. 180 tablet 3  . furosemide (LASIX) 40 MG tablet Take 1 tablet (40 mg total) by mouth daily. 90 tablet 2  . glipiZIDE (GLUCOTROL XL) 10 MG 24 hr tablet TAKE 1 TABLET BY MOUTH DAILY WITH BREAKFAST. FOR DIABETES. 90 tablet 3  . losartan (COZAAR) 100 MG tablet TAKE 1 TABLET BY MOUTH EVERY DAY 90 tablet 3  . metFORMIN (GLUCOPHAGE) 1000 MG tablet TAKE 1 TABLET BY MOUTH TWICE A DAY WITH A MEAL 180 tablet 0  . spironolactone (ALDACTONE) 25 MG tablet TAKE 1 TABLET BY MOUTH 2 TIMES DAILY. 180 tablet 3  . tadalafil (CIALIS) 5 MG tablet TAKE 1 TABLET BY MOUTH DAILY AS NEEDED FOR ERECTILE DYSFUNCTION. 30 tablet 0  . TRULICITY 0.75 MG/0.5ML SOPN INJECT 0.75 MG INTO THE SKIN ONCE A WEEK. FOR DIABETES. 2 mL 1   Current Facility-Administered Medications on File Prior to Visit  Medication Dose Route Frequency Provider Last Rate Last Admin  . acetaminophen (TYLENOL) tablet 650 mg  650 mg Oral Q4H PRN Gollan, Tollie Pizza, MD        BP 126/62   Pulse 67   Temp 98.2 F (36.8 C) (Temporal)   Ht 6' (1.829 m)   Wt 211 lb (95.7 kg)   SpO2 97%   BMI 28.62 kg/m  Objective:   Physical Exam Cardiovascular:     Rate and Rhythm: Normal rate and regular rhythm.  Pulmonary:     Effort: Pulmonary effort is normal.     Breath sounds: Normal breath sounds. No wheezing or rales.  Musculoskeletal:     Cervical back: Neck supple.  Skin:    General: Skin is warm and dry.  Neurological:     Mental Status: He is alert and oriented to person, place, and time.           Assessment & Plan:      This visit occurred during the SARS-CoV-2 public health emergency.  Safety protocols were in place, including screening questions prior to the visit, additional usage of staff PPE, and extensive cleaning of exam room while observing appropriate contact time as indicated for disinfecting solutions.

## 2020-10-17 ENCOUNTER — Other Ambulatory Visit: Payer: Self-pay | Admitting: Primary Care

## 2020-10-17 DIAGNOSIS — E1159 Type 2 diabetes mellitus with other circulatory complications: Secondary | ICD-10-CM

## 2020-10-21 ENCOUNTER — Other Ambulatory Visit: Payer: Self-pay | Admitting: Primary Care

## 2020-10-21 DIAGNOSIS — E1165 Type 2 diabetes mellitus with hyperglycemia: Secondary | ICD-10-CM

## 2020-10-22 ENCOUNTER — Other Ambulatory Visit: Payer: Self-pay | Admitting: Primary Care

## 2020-10-22 DIAGNOSIS — E1159 Type 2 diabetes mellitus with other circulatory complications: Secondary | ICD-10-CM

## 2020-11-30 ENCOUNTER — Other Ambulatory Visit: Payer: Self-pay | Admitting: Primary Care

## 2020-11-30 DIAGNOSIS — N529 Male erectile dysfunction, unspecified: Secondary | ICD-10-CM

## 2020-12-02 ENCOUNTER — Other Ambulatory Visit: Payer: Self-pay | Admitting: Cardiovascular Disease

## 2020-12-02 NOTE — Telephone Encounter (Signed)
Rx request sent to pharmacy.  

## 2021-02-14 ENCOUNTER — Other Ambulatory Visit: Payer: Self-pay | Admitting: Primary Care

## 2021-02-14 DIAGNOSIS — N529 Male erectile dysfunction, unspecified: Secondary | ICD-10-CM

## 2021-02-21 ENCOUNTER — Other Ambulatory Visit: Payer: Self-pay | Admitting: Cardiovascular Disease

## 2021-02-25 ENCOUNTER — Encounter: Payer: Managed Care, Other (non HMO) | Admitting: Primary Care

## 2021-03-04 ENCOUNTER — Encounter: Payer: Self-pay | Admitting: Primary Care

## 2021-03-04 ENCOUNTER — Ambulatory Visit (INDEPENDENT_AMBULATORY_CARE_PROVIDER_SITE_OTHER): Payer: Managed Care, Other (non HMO) | Admitting: Primary Care

## 2021-03-04 ENCOUNTER — Other Ambulatory Visit: Payer: Self-pay

## 2021-03-04 VITALS — BP 118/64 | HR 68 | Temp 97.3°F | Ht 72.0 in | Wt 212.0 lb

## 2021-03-04 DIAGNOSIS — E782 Mixed hyperlipidemia: Secondary | ICD-10-CM

## 2021-03-04 DIAGNOSIS — Z125 Encounter for screening for malignant neoplasm of prostate: Secondary | ICD-10-CM

## 2021-03-04 DIAGNOSIS — Z114 Encounter for screening for human immunodeficiency virus [HIV]: Secondary | ICD-10-CM

## 2021-03-04 DIAGNOSIS — Z0000 Encounter for general adult medical examination without abnormal findings: Secondary | ICD-10-CM

## 2021-03-04 DIAGNOSIS — I712 Thoracic aortic aneurysm, without rupture: Secondary | ICD-10-CM

## 2021-03-04 DIAGNOSIS — N529 Male erectile dysfunction, unspecified: Secondary | ICD-10-CM | POA: Diagnosis not present

## 2021-03-04 DIAGNOSIS — E041 Nontoxic single thyroid nodule: Secondary | ICD-10-CM

## 2021-03-04 DIAGNOSIS — E1159 Type 2 diabetes mellitus with other circulatory complications: Secondary | ICD-10-CM

## 2021-03-04 DIAGNOSIS — I5022 Chronic systolic (congestive) heart failure: Secondary | ICD-10-CM

## 2021-03-04 DIAGNOSIS — I10 Essential (primary) hypertension: Secondary | ICD-10-CM | POA: Diagnosis not present

## 2021-03-04 DIAGNOSIS — I7121 Aneurysm of the ascending aorta, without rupture: Secondary | ICD-10-CM

## 2021-03-04 LAB — COMPREHENSIVE METABOLIC PANEL
ALT: 23 U/L (ref 0–53)
AST: 15 U/L (ref 0–37)
Albumin: 4.7 g/dL (ref 3.5–5.2)
Alkaline Phosphatase: 75 U/L (ref 39–117)
BUN: 21 mg/dL (ref 6–23)
CO2: 26 mEq/L (ref 19–32)
Calcium: 10.2 mg/dL (ref 8.4–10.5)
Chloride: 101 mEq/L (ref 96–112)
Creatinine, Ser: 1.15 mg/dL (ref 0.40–1.50)
GFR: 71.69 mL/min (ref >60.00)
Glucose, Bld: 117 mg/dL — ABNORMAL HIGH (ref 70–99)
Potassium: 4.1 mEq/L (ref 3.5–5.1)
Sodium: 138 mEq/L (ref 135–145)
Total Bilirubin: 1 mg/dL (ref 0.2–1.2)
Total Protein: 7.5 g/dL (ref 6.0–8.3)

## 2021-03-04 LAB — TSH: TSH: 3.92 u[IU]/mL (ref 0.35–5.50)

## 2021-03-04 LAB — LIPID PANEL
Cholesterol: 116 mg/dL (ref 0–200)
HDL: 29.7 mg/dL — ABNORMAL LOW (ref >39.00)
NonHDL: 86.31
Total CHOL/HDL Ratio: 4
Triglycerides: 224 mg/dL — ABNORMAL HIGH (ref 0.0–149.0)
VLDL: 44.8 mg/dL — ABNORMAL HIGH (ref 0.0–40.0)

## 2021-03-04 LAB — PSA: PSA: 0.31 ng/mL (ref 0.10–4.00)

## 2021-03-04 LAB — LDL CHOLESTEROL, DIRECT: Direct LDL: 60 mg/dL

## 2021-03-04 LAB — HEMOGLOBIN A1C: Hgb A1c MFr Bld: 6.2 % (ref 4.6–6.5)

## 2021-03-04 NOTE — Assessment & Plan Note (Signed)
Repeat TSH pending. Due for repeat thyroid ultrasound. If ultrasound is unchanged then should be clear to discontinue screenings.  Await results.

## 2021-03-04 NOTE — Assessment & Plan Note (Signed)
Following with cardiology, due in September 2022.

## 2021-03-04 NOTE — Assessment & Plan Note (Signed)
Well controlled in the office today, continue carvedilol 6.25 mg BID, losartan 100 mg, spironolactone 25 mg BID.   Labs pending.

## 2021-03-04 NOTE — Assessment & Plan Note (Signed)
Declines influenza vaccine. Other vaccines UTD. PSA due and pending. Colonoscopy UTD, due 2027.  Discussed the importance of a healthy diet and regular exercise in order for weight loss, and to reduce the risk of further co-morbidity.  Exam today stable. Labs pending. 

## 2021-03-04 NOTE — Assessment & Plan Note (Signed)
Appears euvolemic today.  Continue carvedilol 6.25 mg BID, spirolactone 25 mg BID. Following with cardiology.

## 2021-03-04 NOTE — Assessment & Plan Note (Signed)
Compliant to atorvastatin 40 mg, repeat lipids pending.

## 2021-03-04 NOTE — Patient Instructions (Signed)
Stop by the lab prior to leaving today. I will notify you of your results once received.   You will be contacted regarding your referral for your thyroid ultrasound.  Please let us know if you have not been contacted within two weeks.   It was a pleasure to see you today!  Preventive Care 20-55 Years Old, Male Preventive care refers to lifestyle choices and visits with your health care provider that can promote health and wellness. This includes: A yearly physical exam. This is also called an annual wellness visit. Regular dental and eye exams. Immunizations. Screening for certain conditions. Healthy lifestyle choices, such as: Eating a healthy diet. Getting regular exercise. Not using drugs or products that contain nicotine and tobacco. Limiting alcohol use. What can I expect for my preventive care visit? Physical exam Your health care provider will check your: Height and weight. These may be used to calculate your BMI (body mass index). BMI is a measurement that tells if you are at a healthy weight. Heart rate and blood pressure. Body temperature. Skin for abnormal spots. Counseling Your health care provider may ask you questions about your: Past medical problems. Family's medical history. Alcohol, tobacco, and drug use. Emotional well-being. Home life and relationship well-being. Sexual activity. Diet, exercise, and sleep habits. Work and work Astronomer. Access to firearms. What immunizations do I need?  Vaccines are usually given at various ages, according to a schedule. Your health care provider will recommend vaccines for you based on your age, medicalhistory, and lifestyle or other factors, such as travel or where you work. What tests do I need? Blood tests Lipid and cholesterol levels. These may be checked every 5 years, or more often if you are over 65 years old. Hepatitis C test. Hepatitis B test. Screening Lung cancer screening. You may have this screening  every year starting at age 10 if you have a 30-pack-year history of smoking and currently smoke or have quit within the past 15 years. Prostate cancer screening. Recommendations will vary depending on your family history and other risks. Genital exam to check for testicular cancer or hernias. Colorectal cancer screening. All adults should have this screening starting at age 56 and continuing until age 62. Your health care provider may recommend screening at age 32 if you are at increased risk. You will have tests every 1-10 years, depending on your results and the type of screening test. Diabetes screening. This is done by checking your blood sugar (glucose) after you have not eaten for a while (fasting). You may have this done every 1-3 years. STD (sexually transmitted disease) testing, if you are at risk. Follow these instructions at home: Eating and drinking  Eat a diet that includes fresh fruits and vegetables, whole grains, lean protein, and low-fat dairy products. Take vitamin and mineral supplements as recommended by your health care provider. Do not drink alcohol if your health care provider tells you not to drink. If you drink alcohol: Limit how much you have to 0-2 drinks a day. Be aware of how much alcohol is in your drink. In the [place] one drink equals one 12 oz bottle of beer (355 mL), one 5 oz glass of wine (148 mL), or one 1 oz glass of hard liquor (44 mL).  Lifestyle Take daily care of your teeth and gums. Brush your teeth every morning and night with fluoride toothpaste. Floss one time each day. Stay active. Exercise for at least 30 minutes 5 or more days each week. Do  not use any products that contain nicotine or tobacco, such as cigarettes, e-cigarettes, and chewing tobacco. If you need help quitting, ask your health care provider. Do not use drugs. If you are sexually active, practice safe sex. Use a condom or other form of protection to prevent STIs (sexually  transmitted infections). If told by your health care provider, take low-dose aspirin daily starting at age 104. Find healthy ways to cope with stress, such as: Meditation, yoga, or listening to music. Journaling. Talking to a trusted person. Spending time with friends and family. Safety Always wear your seat belt while driving or riding in a vehicle. Do not drive: If you have been drinking alcohol. Do not ride with someone who has been drinking. When you are tired or distracted. While texting. Wear a helmet and other protective equipment during sports activities. If you have firearms in your house, make sure you follow all gun safety procedures. What's next? Go to your health care provider once a year for an annual wellness visit. Ask your health care provider how often you should have your eyes and teeth checked. Stay up to date on all vaccines. This information is not intended to replace advice given to you by your health care provider. Make sure you discuss any questions you have with your healthcare provider. Document Revised: 03/25/2019 Document Reviewed: 06/20/2018 Elsevier Patient Education  2022 [employer].

## 2021-03-04 NOTE — Assessment & Plan Note (Signed)
Doing well on tadalafil 5 mg PRN, continue same.

## 2021-03-04 NOTE — Assessment & Plan Note (Signed)
Compliant to Trulicity 0.75 mg weekly, Glipizide XL 10 mg daily, and metformin 1000 mg BID, continue same.  Repeat A1C pending. Managed on statin and ARB. Eye exam scheduled. Foot exam UTD.  Follow up in 6 months.

## 2021-03-04 NOTE — Progress Notes (Signed)
Subjective:    Patient ID: Dennis Curry, male    DOB: 08-16-1965, 55 y.o.   MRN: [id number]  HPI  Dennis Curry is a very pleasant 55 y.o. male who presents today for complete physical and follow up of chronic conditions.  Immunizations: -Tetanus: 2021 -Influenza: Declines -Covid-19: 2 vaccines -Shingles: Shingrix -Pneumonia: 2019   Diet: Fair diet, has gained some weight Exercise: No regular exercise, activity   Eye exam: Completes annually  Dental exam: Completes semi-annually   Colonoscopy: Completed in 2017, due 2027 PSA: Due  BP Readings from Last 3 Encounters:  03/04/21 118/64  10/15/20 126/62  04/09/20 124/62        Review of Systems  Constitutional:  Negative for unexpected weight change.  HENT:  Negative for rhinorrhea.   Respiratory:  Negative for cough and shortness of breath.   Cardiovascular:  Negative for chest pain.  Gastrointestinal:  Negative for constipation and diarrhea.  Genitourinary:  Negative for difficulty urinating.  Musculoskeletal:  Negative for arthralgias and myalgias.  Skin:  Negative for rash.  Allergic/Immunologic: Negative for environmental allergies.  Neurological:  Negative for dizziness and headaches.  Psychiatric/Behavioral:  The patient is not nervous/anxious.         Past Medical History:  Diagnosis Date   Aortic insufficiency    a. TTE 11/30/14: EF 25-30%, bicupid valve cannot be excluded, mod-severe aortic regurge, aortic root dimension 52 mm, mild MR, mildly dilated LA/RA, PASP 60; TEE 12/01/14: EF 25-30%, diffuse HK, aortic valve trileaflet, mild to mod Ao regurge, ascending aortic grossly measured at 4.5 cm (not well visualized),    Ascending aorta dilatation (HCC)    a. 52 mm by TTE 11/2014; b. 45 mm by TEE 11/2014 (not well visualized)   Chronic systolic CHF (congestive heart failure) (HCC)    a. TTE 11/30/14: EF 25-30%, bicupid valve cannot be excluded, mod-severe aortic regurge, aortic root dimension 52 mm, mild  MR, mildly dilated LA/RA, PASP 60; TEE 12/01/14: EF 25-30%, diffuse HK, aortic valve trileaflet, mild to mod Ao regurge, ascending aortic grossly measured at 4.5 cm (not well visualized),    Diabetes mellitus without complication (HCC)    Hyperlipidemia    Hypertension    Influenza A 09/02/2018   Nonischemic dilated cardiomyopathy (HCC)    a. cath 12/02/14: right dom coronary system w/ mild lumenal irregs, o/w no sig CAD, sev diffuse HK EF 25%, no sig MR or AS, med Rx recommeded     Social History   Socioeconomic History   Marital status: Married    Spouse name: Not on file   Number of children: Not on file   Years of education: Not on file   Highest education level: Not on file  Occupational History   Not on file  Tobacco Use   Smoking status: Never   Smokeless tobacco: Never  Substance and Sexual Activity   Alcohol use: No    Alcohol/week: 0.0 standard drinks   Drug use: No   Sexual activity: Not on file  Other Topics Concern   Not on file  Social History Narrative   Married.   Works as a Engineer, structural at [employer].   No children.   Enjoys playing guitar, traveling, spending time with his wife.   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     atorvastatin (LIPITOR) 40 MG tablet TAKE 1 TABLET BY MOUTH AT BEDTIME. 90 tablet 3   carvedilol (COREG) 6.25 MG tablet TAKE 1 TABLET BY MOUTH 2 TIMES DAILY WITH A MEAL. 180 tablet 3   furosemide (LASIX) 40 MG tablet Take 1 tablet (40 mg total) by mouth daily. 90 tablet 0   glipiZIDE (GLUCOTROL XL) 10 MG 24 hr tablet TAKE 1 TABLET BY MOUTH DAILY WITH BREAKFAST. FOR DIABETES. 90 tablet 3   losartan (COZAAR) 100 MG tablet TAKE 1 TABLET BY MOUTH EVERY DAY 90 tablet 3   metFORMIN (GLUCOPHAGE) 1000 MG tablet Take 1 tablet (1,000 mg total) by mouth 2 (two) times daily with a meal. For diabetes. 180 tablet 3   spironolactone (ALDACTONE) 25 MG tablet TAKE 1 TABLET BY MOUTH 2 TIMES DAILY. 180 tablet 3   tadalafil (CIALIS) 5 MG tablet TAKE 1 TABLET BY MOUTH DAILY AS NEEDED FOR ERECTILE DYSFUNCTION. 30 tablet 0   TRULICITY 0.75 MG/0.5ML SOPN INJECT 0.75 MG INTO THE SKIN ONCE A WEEK. FOR DIABETES. 6 mL 1   Current Facility-Administered Medications on File Prior to Visit  Medication Dose Route Frequency Provider Last Rate Last Admin   acetaminophen (TYLENOL) tablet 650 mg  650 mg Oral Q4H PRN Gollan, Tollie Pizza, MD        BP 118/64   Pulse 68   Temp (!) 97.3 F (36.3 C) (Temporal)   Ht 6' (1.829 m)   Wt 212 lb (96.2 kg)   SpO2 96%   BMI 28.75 kg/m  Objective:   Physical Exam HENT:     Right Ear: Tympanic membrane and ear canal normal.     Left Ear: Tympanic membrane and ear canal normal.     Nose: Nose normal.     Right Sinus: No maxillary sinus tenderness or frontal sinus tenderness.     Left Sinus: No maxillary sinus tenderness or frontal sinus tenderness.  Eyes:     Conjunctiva/sclera: Conjunctivae normal.  Neck:     Thyroid: No thyromegaly.     Vascular: No carotid bruit.   Cardiovascular:     Rate and Rhythm: Normal rate and regular rhythm.     Heart sounds: Normal heart sounds.  Pulmonary:     Effort: Pulmonary effort is normal.     Breath sounds: Normal breath sounds. No wheezing or rales.  Abdominal:     General: Bowel sounds are normal.     Palpations: Abdomen is soft.     Tenderness: There is no abdominal tenderness.  Musculoskeletal:        General: Normal range of motion.  Cervical back: Neck supple.  Skin:    General: Skin is warm and dry.  Neurological:     Mental Status: He is alert and oriented to person, place, and time.     Cranial Nerves: No cranial nerve deficit.     Deep Tendon Reflexes: Reflexes are normal and symmetric.  Psychiatric:        Mood and Affect: Mood normal.          Assessment & Plan:      This visit occurred during the SARS-CoV-2 public health emergency.  Safety protocols were in place, including screening questions prior to the visit, additional usage of staff PPE, and extensive cleaning of exam room while observing appropriate contact time as indicated for disinfecting solutions.

## 2021-03-06 LAB — HIV ANTIBODY (ROUTINE TESTING W REFLEX): HIV 1&2 Ab, 4th Generation: NONREACTIVE

## 2021-03-09 ENCOUNTER — Other Ambulatory Visit: Payer: Self-pay | Admitting: Cardiovascular Disease

## 2021-03-09 DIAGNOSIS — I10 Essential (primary) hypertension: Secondary | ICD-10-CM

## 2021-03-16 ENCOUNTER — Other Ambulatory Visit: Payer: Self-pay

## 2021-03-16 ENCOUNTER — Ambulatory Visit (INDEPENDENT_AMBULATORY_CARE_PROVIDER_SITE_OTHER): Payer: Managed Care, Other (non HMO)

## 2021-03-16 DIAGNOSIS — I77810 Thoracic aortic ectasia: Secondary | ICD-10-CM

## 2021-03-16 DIAGNOSIS — I5022 Chronic systolic (congestive) heart failure: Secondary | ICD-10-CM

## 2021-03-16 LAB — ECHOCARDIOGRAM COMPLETE
AR max vel: 4.08 cm2
AV Area VTI: 3.61 cm2
AV Area mean vel: 3.85 cm2
AV Mean grad: 3 mmHg
AV Peak grad: 6.2 mmHg
Ao pk vel: 1.24 m/s
Area-P 1/2: 3.33 cm2
P 1/2 time: 581 msec
S' Lateral: 2.9 cm
Single Plane A4C EF: 44.7 %

## 2021-03-16 MED ORDER — PERFLUTREN LIPID MICROSPHERE
1.0000 mL | INTRAVENOUS | Status: AC | PRN
Start: 2021-03-16 — End: 2021-03-16
  Administered 2021-03-16: 2 mL via INTRAVENOUS

## 2021-03-22 ENCOUNTER — Ambulatory Visit: Payer: Managed Care, Other (non HMO)

## 2021-03-22 ENCOUNTER — Telehealth: Payer: Self-pay

## 2021-03-22 LAB — HM DIABETES EYE EXAM

## 2021-03-22 NOTE — Telephone Encounter (Signed)
Able to reach pt regarding his recent ECHO Dr. Mariah Milling had a chance to review his results and advised   "Echocardiogram  Cardiac function estimated 45 to 50%, normal is 55%  Dilated aorta moderate to severe, estimated 48 mm   Compared to prior study no change in aorta size  Ejection fraction also at the same compared to September 2021 "  Dennis Curry is very thankful for the phone call of her results, all questions and concerns were address with nothing further at this time. Will see at next schedule f/u appt on 9/28 with Dr. Mariah Milling

## 2021-03-23 ENCOUNTER — Other Ambulatory Visit: Payer: Self-pay

## 2021-03-23 ENCOUNTER — Ambulatory Visit
Admission: RE | Admit: 2021-03-23 | Discharge: 2021-03-23 | Disposition: A | Discharge status: Home / Self Care | Payer: Managed Care, Other (non HMO) | Source: Ambulatory Visit | Attending: Primary Care | Admitting: Primary Care

## 2021-03-23 DIAGNOSIS — E041 Nontoxic single thyroid nodule: Secondary | ICD-10-CM | POA: Insufficient documentation

## 2021-03-30 ENCOUNTER — Encounter: Payer: Self-pay | Admitting: Primary Care

## 2021-04-03 ENCOUNTER — Other Ambulatory Visit: Payer: Self-pay | Admitting: Primary Care

## 2021-04-03 DIAGNOSIS — E1159 Type 2 diabetes mellitus with other circulatory complications: Secondary | ICD-10-CM

## 2021-04-05 NOTE — Progress Notes (Signed)
Cardiology Office Note  Date:  04/06/2021   ID:  Dennis Curry, DOB February 05, 1966, MRN [id number]  PCP:  [name], NP   Chief Complaint  Patient presents with   12 month follow up     "Doing well." Medications reviewed by the patient verbally.     HPI:  55 year old gentleman with PMH of DM II Nonischemic cardiomyopathy EF 25% in 2016 , up to 45 to 50% in 2016, 50 to 55%  In 2017 No CAD on cardiac cath, 2016 Aortic root 50 mm, ascending aorta 45 mm, unchanged dating back to 2016 Aortic valve regurgitation is moderate. , trileaflet aortic valve who presents for routine follow-up of his systolic CHF and dilated ascending aorta  In follow-up today, reports he is doing well Now on Trulicity  Denies chest pain concerning for angina, no shortness of breath No significant leg swelling Remains on Lasix 40 mg daily, stable renal function as detailed below Weight continues to run high  Discussed recent cardiac testing Cardiac function estimated 45 to 50%,  Dilated aorta moderate to severe, estimated 48 mm Compared to prior study no change in aorta size Ejection fraction same as September 2021  HBA1C 6.2 down from 8.8 one-year ago June 2021  Labs reviewed LDL 60 Total chol 132, A1C 6.2 CR 1.15,   Echocardiogram September 2021 Ejection fraction 45 to 50%, LV mildly dilated, Report of asymmetric hypertrophy of the posterior lateral wall,  Aortic root 50 mm, ascending aorta 45 mm, unchanged compared to 2019 Compared to echo 2019 at that time ejection fraction 50 to 55% Measurements aortic root 50 mm, ascending aorta 45 Measurements are also unchanged compared to October 2017  EKG personally reviewed by myself on todays visit Shows nsr rate 70 bpm, RBBB, LAFB  Other past medical history reviewed  CT chest: October 55 1. Stable aneurysmal disease of the thoracic aorta with aortic root diameter of approximately 4.7-4.8 cm at the sinuses of Valsalva and maximal  ascending thoracic aorta diameter of 4.5 cm. 2. Coronary atherosclerosis with calcified plaque in the distribution of the LAD and RCA. 3. Aneurysmal disease of the ascending thoracic aorta does exert some mass effect on the SVC causing some apparent narrowing of the distal SVC by CT.   TEE 55 4.5 aorta ascending Aorta Root 4.4  Echo TTE 55  Ascending  aorta 4.5  Initially developed progressive chest congestion initially felt to be allergies, treated with several rounds of ABX,  presenting to the hospital with worsening shortness of breath found to have acute systolic CHF, EF 67%, dilated ascending aorta, cardiac catheterization showing no significant CAD, aorta 4.5 cm on TEE,  PMH:   has a past medical history of Aortic insufficiency, Ascending aorta dilatation (HCC), Chronic systolic CHF (congestive heart failure) (HCC), Diabetes mellitus without complication (HCC), Hyperlipidemia, Hypertension, Influenza A (09/02/2018), and Nonischemic dilated cardiomyopathy (HCC).  PSH:    Past Surgical History:  Procedure Laterality Date   CARDIAC CATHETERIZATION N/A 12/02/2014   Procedure: Left Heart Cath and Coronary Angiography; 55  Surgeon: Antonieta Iba, MD;  Location: [facility] INVASIVE CV LAB;  Service: Cardiovascular;  Laterality: N/A;   COLONOSCOPY  1992   mandatory w [employer] company where pt emp   CORONARY ANGIOPLASTY     OTHER SURGICAL HISTORY     none    Current Outpatient Medications  Medication Sig Dispense Refill   aspirin 81 MG tablet Take 81 mg by mouth daily.     atorvastatin (LIPITOR) 40 MG tablet TAKE  1 TABLET BY MOUTH AT BEDTIME. 90 tablet 3   carvedilol (COREG) 6.25 MG tablet TAKE 1 TABLET BY MOUTH 2 TIMES DAILY WITH A MEAL. 180 tablet 0   Dulaglutide (TRULICITY) 0.75 MG/0.5ML SOPN INJECT 0.75 MG INTO THE SKIN ONCE A WEEK. FOR DIABETES. 6 mL 1   furosemide (LASIX) 40 MG tablet Take 1 tablet (40 mg total) by mouth daily. 90 tablet 0   glipiZIDE (GLUCOTROL XL) 10 MG 24 hr  tablet TAKE 1 TABLET BY MOUTH DAILY WITH BREAKFAST. FOR DIABETES. 90 tablet 3   losartan (COZAAR) 100 MG tablet TAKE 1 TABLET BY MOUTH EVERY DAY 90 tablet 3   metFORMIN (GLUCOPHAGE) 1000 MG tablet Take 1 tablet (1,000 mg total) by mouth 2 (two) times daily with a meal. For diabetes. 180 tablet 3   spironolactone (ALDACTONE) 25 MG tablet TAKE 1 TABLET BY MOUTH 2 TIMES DAILY. 180 tablet 0   tadalafil (CIALIS) 5 MG tablet TAKE 1 TABLET BY MOUTH DAILY AS NEEDED FOR ERECTILE DYSFUNCTION. 30 tablet 0   No current facility-administered medications for this visit.   Facility-Administered Medications Ordered in Other Visits  Medication Dose Route Frequency Provider Last Rate Last Admin   acetaminophen (TYLENOL) tablet 650 mg  650 mg Oral Q4H PRN Murry Diaz, Tollie Pizza, MD         Allergies:   Patient has no known allergies.   Social History:  The patient  reports that he has never smoked. He has never used smokeless tobacco. He reports that he does not drink alcohol and does not use drugs.   Family History:   family history includes Coronary artery disease in his mother; Diabetes Mellitus II in his paternal uncle; Hypertension in his father.    Review of Systems: Review of Systems  Constitutional: Negative.   Respiratory: Negative.    Cardiovascular: Negative.   Gastrointestinal: Negative.   Musculoskeletal: Negative.   Neurological: Negative.   Psychiatric/Behavioral: Negative.    All other systems reviewed and are negative.   PHYSICAL EXAM: VS:  BP 120/70 (BP Location: Left Arm, Patient Position: Sitting, Cuff Size: Normal)   Pulse 70   Ht 6' (1.829 m)   Wt 219 lb 2 oz (99.4 kg)   SpO2 98%   BMI 29.72 kg/m  , BMI Body mass index is 29.72 kg/m. Constitutional:  oriented to person, place, and time. No distress.  HENT:  Head: Grossly normal Eyes:  no discharge. No scleral icterus.  Neck: No JVD, no carotid bruits  Cardiovascular: Regular rate and rhythm, no murmurs  appreciated Pulmonary/Chest: Clear to auscultation bilaterally, no wheezes or rails Abdominal: Soft.  no distension.  no tenderness.  Musculoskeletal: Normal range of motion Neurological:  normal muscle tone. Coordination normal. No atrophy Skin: Skin warm and dry Psychiatric: normal affect, pleasant   Recent Labs: 03/04/2021: ALT 23; BUN 21; Creatinine, Ser 1.15; Potassium 4.1; Sodium 138; TSH 3.92    Lipid Panel Lab Results  Component Value Date   CHOL 116 03/04/2021   HDL 29.70 (L) 03/04/2021   TRIG 224.0 (H) 03/04/2021      Wt Readings from Last 3 Encounters:  04/06/21 219 lb 2 oz (99.4 kg)  03/04/21 212 lb (96.2 kg)  10/15/20 211 lb (95.7 kg)       ASSESSMENT AND PLAN:  Ascending aorta dilation (HCC) - Previous results from 2015, 2016, 2017 2018 reviewed CT scan 2017 and 2019 Stable on echocardiogram in 2022  systolic CHF (congestive heart failure) (HCC) -   ejection  fraction 50-55% last year 2018 stable  Aortic valve disorder -  trileaflet on transesophageal echo Moderate aortic valve regurgitation, minimal murmur on exam, he reports asymptomatic  Essential hypertension -  Blood pressure is well controlled on today's visit. No changes made to the medications. Additional medication options discussed such as changing losartan to Entresto  Nonischemic dilated cardiomyopathy (HCC) -  Stable ejection fraction 45 to 50% Discussed additional medication options such as changing his losartan to [facility], also the addition of Jardiance/Farxiga We will do some reading, and talk with primary care concerning his diabetes medications  Aneurysm, ascending aorta (HCC) Stable size on recent echocardiogram Will continue to evaluate on annual basis   Total encounter time more than 25 minutes  Greater than 50% was spent in counseling and coordination of care with the patient   No orders of the defined types were placed in this encounter.    Signed, Dossie Arbour,  M.D., Ph.D. 04/06/2021  [facility] [place], [place] [phone]

## 2021-04-06 ENCOUNTER — Encounter: Payer: Self-pay | Admitting: Cardiovascular Disease

## 2021-04-06 ENCOUNTER — Ambulatory Visit: Payer: Managed Care, Other (non HMO) | Admitting: Cardiovascular Disease

## 2021-04-06 ENCOUNTER — Other Ambulatory Visit: Payer: Self-pay

## 2021-04-06 VITALS — BP 120/70 | HR 70 | Ht 72.0 in | Wt 219.1 lb

## 2021-04-06 DIAGNOSIS — E785 Hyperlipidemia, unspecified: Secondary | ICD-10-CM

## 2021-04-06 DIAGNOSIS — I5022 Chronic systolic (congestive) heart failure: Secondary | ICD-10-CM

## 2021-04-06 DIAGNOSIS — I10 Essential (primary) hypertension: Secondary | ICD-10-CM

## 2021-04-06 DIAGNOSIS — I420 Dilated cardiomyopathy: Secondary | ICD-10-CM

## 2021-04-06 DIAGNOSIS — E1159 Type 2 diabetes mellitus with other circulatory complications: Secondary | ICD-10-CM

## 2021-04-06 DIAGNOSIS — I77810 Thoracic aortic ectasia: Secondary | ICD-10-CM

## 2021-04-06 DIAGNOSIS — I359 Nonrheumatic aortic valve disorder, unspecified: Secondary | ICD-10-CM

## 2021-04-06 DIAGNOSIS — E782 Mixed hyperlipidemia: Secondary | ICD-10-CM

## 2021-04-06 MED ORDER — FUROSEMIDE 40 MG PO TABS: 40.0000 mg | ORAL_TABLET | Freq: Every day | ORAL | 3 refills | Status: DC

## 2021-04-06 MED ORDER — LOSARTAN POTASSIUM 100 MG PO TABS: 100.0000 mg | ORAL_TABLET | Freq: Every day | ORAL | 3 refills | Status: DC

## 2021-04-06 MED ORDER — SPIRONOLACTONE 25 MG PO TABS: 25.0000 mg | ORAL_TABLET | Freq: Two times a day (BID) | ORAL | 3 refills | Status: DC

## 2021-04-06 MED ORDER — ATORVASTATIN CALCIUM 40 MG PO TABS: 40.0000 mg | ORAL_TABLET | Freq: Every day | ORAL | 3 refills | Status: DC

## 2021-04-06 MED ORDER — CARVEDILOL 6.25 MG PO TABS: 6.2500 mg | ORAL_TABLET | Freq: Two times a day (BID) | ORAL | 3 refills | Status: DC

## 2021-04-06 NOTE — Patient Instructions (Addendum)
Medication Instructions:  No changes Please read about switching from losartan to Entresto (combo drug Sacubitril/valsartan)  Ask PMD about jardiance/farxiga (SGLT2 inh)  If you need a refill on your cardiac medications before your next appointment, please call your pharmacy.   Lab work: No new labs needed  Testing/Procedures: No new testing needed  Follow-Up: At [facility], you and your health needs are our priority.  As part of our continuing mission to provide you with exceptional heart care, we have created designated Provider Care Teams.  These Care Teams include your primary Cardiologist (physician) and Advanced Practice Providers (APPs -  Physician Assistants and Nurse Practitioners) who all work together to provide you with the care you need, when you need it.  You will need a follow up appointment in 12 months  Providers on your designated Care Team:   Nicolasa Ducking, NP Eula Listen, PA-C Marisue Ivan, PA-C [place], [place]  COVID-19 Vaccine Information can be found at: [email or web address] For questions related to vaccine distribution or appointments, please email [email or web address] or call 8500407072.

## 2021-05-14 ENCOUNTER — Other Ambulatory Visit: Payer: Self-pay | Admitting: Primary Care

## 2021-05-14 DIAGNOSIS — N529 Male erectile dysfunction, unspecified: Secondary | ICD-10-CM

## 2021-07-27 ENCOUNTER — Other Ambulatory Visit: Payer: Self-pay | Admitting: Primary Care

## 2021-07-27 DIAGNOSIS — N529 Male erectile dysfunction, unspecified: Secondary | ICD-10-CM

## 2021-08-02 ENCOUNTER — Other Ambulatory Visit: Payer: Self-pay | Admitting: Primary Care

## 2021-08-02 DIAGNOSIS — E1159 Type 2 diabetes mellitus with other circulatory complications: Secondary | ICD-10-CM

## 2021-09-06 ENCOUNTER — Other Ambulatory Visit: Payer: Self-pay

## 2021-09-06 ENCOUNTER — Ambulatory Visit: Payer: 59 | Admitting: Primary Care

## 2021-09-06 ENCOUNTER — Encounter: Payer: Self-pay | Admitting: Primary Care

## 2021-09-06 VITALS — BP 124/68 | HR 70 | Temp 98.6°F | Ht 72.0 in | Wt 220.0 lb

## 2021-09-06 DIAGNOSIS — E1159 Type 2 diabetes mellitus with other circulatory complications: Secondary | ICD-10-CM | POA: Diagnosis not present

## 2021-09-06 LAB — POCT GLYCOSYLATED HEMOGLOBIN (HGB A1C): Hemoglobin A1C: 6.5 % — AB (ref 4.0–5.6)

## 2021-09-06 NOTE — Progress Notes (Signed)
Subjective:    Patient ID: Dennis Curry, male    DOB: 04-11-1966, 56 y.o.   MRN: [id number]  HPI  Dennis Curry is a very pleasant 56 y.o. male with a history of CHF, CAD, hypertension, type 2 diabetes, aortic aneurysm, hyperlipidemia who presents today for follow-up of diabetes.  Current medications include: Metformin 1000 mg BID, glipizide XL 10 mg daily, Trulicity A999333 mg weekly.  He is checking his blood glucose on occasion  right after eating sugary foods and is getting readings ranging 130-240.  Last A1C: 6.2 in August 2022, 6.5 today Last Eye Exam: UTD Last Foot Exam: UTD Pneumonia Vaccination: 2019 Urine Microalbumin: None.  Managed on ARB Statin: Atorvastatin  Dietary changes since last visit: He has been eating lower amounts of carbohydrates overall.    Exercise: Active at home. No regular exercise.       Review of Systems  Eyes:  Negative for visual disturbance.  Respiratory:  Negative for shortness of breath.   Cardiovascular:  Negative for chest pain.  Endocrine: Negative for polyuria.  Neurological:  Negative for dizziness and numbness.        Past Medical History:  Diagnosis Date   Aortic insufficiency    a. TTE 11/30/14: EF 25-30%, bicupid valve cannot be excluded, mod-severe aortic regurge, aortic root dimension 52 mm, mild MR, mildly dilated LA/RA, PASP 60; TEE 12/01/14: EF 25-30%, diffuse HK, aortic valve trileaflet, mild to mod Ao regurge, ascending aortic grossly measured at 4.5 cm (not well visualized),    Ascending aorta dilatation (HCC)    a. 52 mm by TTE 11/2014; b. 45 mm by TEE 11/2014 (not well visualized)   Chronic systolic CHF (congestive heart failure) ([place])    a. TTE 11/30/14: EF 25-30%, bicupid valve cannot be excluded, mod-severe aortic regurge, aortic root dimension 52 mm, mild MR, mildly dilated LA/RA, PASP 60; TEE 12/01/14: EF 25-30%, diffuse HK, aortic valve trileaflet, mild to mod Ao regurge, ascending aortic grossly measured at 4.5 cm  (not well visualized),    Diabetes mellitus without complication ([place])    Hyperlipidemia    Hypertension    Influenza A 09/02/2018   Nonischemic dilated cardiomyopathy ([place])    a. cath 12/02/14: right dom coronary system w/ mild lumenal irregs, o/w no sig CAD, sev diffuse HK EF 25%, no sig MR or AS, med Rx recommeded     Social History   Socioeconomic History   Marital status: Married    Spouse name: Not on file   Number of children: Not on file   Years of education: Not on file   Highest education level: Not on file  Occupational History   Not on file  Tobacco Use   Smoking status: Never   Smokeless tobacco: Never  Substance and Sexual Activity   Alcohol use: No    Alcohol/week: 0.0 standard drinks   Drug use: No   Sexual activity: Not on file  Other Topics Concern   Not on file  Social History Narrative   Married.   Works as a Building services engineer at [employer].   No children.   Enjoys playing guitar, traveling, spending time with his wife.   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     atorvastatin (LIPITOR) 40 MG tablet Take 1 tablet (40 mg total) by mouth at bedtime. 90 tablet 3   carvedilol (COREG) 6.25 MG tablet Take 1 tablet (6.25 mg total) by mouth 2 (two) times daily with a meal. 180 tablet 3   Dulaglutide (TRULICITY) A999333 0000000 SOPN INJECT 0.75 MG INTO THE SKIN ONCE A WEEK. FOR DIABETES. 6 mL 1   furosemide (LASIX) 40 MG tablet Take 1 tablet (40 mg total) by mouth daily. 90 tablet 3   glipiZIDE (GLUCOTROL XL) 10 MG 24 hr tablet TAKE 1 TABLET BY MOUTH DAILY WITH BREAKFAST. FOR DIABETES. 90 tablet 3   losartan (COZAAR) 100 MG tablet Take 1 tablet (100 mg total) by mouth daily. 90 tablet 3   metFORMIN (GLUCOPHAGE) 1000 MG tablet Take 1 tablet (1,000 mg total) by mouth 2 (two) times daily with a meal. For diabetes. 180 tablet 3   spironolactone (ALDACTONE) 25 MG tablet Take 1 tablet (25 mg total) by mouth 2 (two) times daily. 180 tablet 3   tadalafil (CIALIS) 5 MG tablet TAKE 1 TABLET BY MOUTH DAILY AS NEEDED FOR ERECTILE DYSFUNCTION. 30 tablet 0   Current Facility-Administered Medications on File Prior to Visit  Medication Dose Route Frequency Provider Last Rate Last Admin   acetaminophen (TYLENOL) tablet 650 mg  650 mg Oral Q4H PRN Gollan, Kathlene November, MD        BP 124/68    Pulse 70    Temp 98.6 F (37 C) (Oral)    Ht 6' (1.829 m)    Wt 220 lb (99.8 kg)    SpO2 97%    BMI 29.84 kg/m  Objective:   Physical Exam Cardiovascular:     Rate and Rhythm: Normal rate and regular rhythm.  Pulmonary:     Effort: Pulmonary effort is normal.     Breath sounds: Normal breath sounds. No wheezing or rales.  Musculoskeletal:     Cervical back: Neck supple.  Skin:    General: Skin is warm and dry.  Neurological:     Mental Status: He is alert and oriented to person, place, and time.          Assessment & Plan:      This visit occurred during the SARS-CoV-2 public health emergency.  Safety protocols were in place, including  screening questions prior to the visit, additional usage of staff PPE, and extensive cleaning of exam room while observing appropriate contact time as indicated for disinfecting solutions.

## 2021-09-06 NOTE — Patient Instructions (Signed)
Increase exercise.  Continue to work on [employer].   It was a pleasure to see you today!

## 2021-09-06 NOTE — Assessment & Plan Note (Signed)
Controlled with A1C of 6.5 today. He is working to improve his diet and increase exercise.  Continue Trulicity 0.75 mg weekly, Glipizide XL 10 mg daily, metformin 1000 mg BID.  Managed on ARB and statin. Pneumonia vaccine UTD. Eye and foot exams UTD.  Follow up in 3-6 months.

## 2021-09-09 ENCOUNTER — Other Ambulatory Visit: Payer: Self-pay | Admitting: Primary Care

## 2021-09-09 DIAGNOSIS — E1159 Type 2 diabetes mellitus with other circulatory complications: Secondary | ICD-10-CM

## 2021-09-09 DIAGNOSIS — E1165 Type 2 diabetes mellitus with hyperglycemia: Secondary | ICD-10-CM

## 2021-09-22 ENCOUNTER — Other Ambulatory Visit: Payer: Self-pay | Admitting: Primary Care

## 2021-10-13 ENCOUNTER — Telehealth: Payer: 59 | Admitting: Physician Assistant

## 2021-10-13 DIAGNOSIS — U071 COVID-19: Secondary | ICD-10-CM

## 2021-10-13 MED ORDER — MOLNUPIRAVIR EUA 200MG CAPSULE: 4.0000 | ORAL_CAPSULE | Freq: Two times a day (BID) | ORAL | 0 refills | Status: AC

## 2021-10-13 NOTE — Patient Instructions (Signed)
?Dennis Curry, thank you for joining Piedad Climes, PA-C for today's virtual visit.  While this provider is not your primary care provider (PCP), if your PCP is located in our provider database this encounter information will be shared with them immediately following your visit. ? ?Consent: ?(Patient) Dennis Curry provided verbal consent for this virtual visit at the beginning of the encounter. ? ?Current Medications: ? ?Current Outpatient Medications:  ?  aspirin 81 MG tablet, Take 81 mg by mouth daily., Disp: , Rfl:  ?  atorvastatin (LIPITOR) 40 MG tablet, Take 1 tablet (40 mg total) by mouth at bedtime., Disp: 90 tablet, Rfl: 3 ?  carvedilol (COREG) 6.25 MG tablet, Take 1 tablet (6.25 mg total) by mouth 2 (two) times daily with a meal., Disp: 180 tablet, Rfl: 3 ?  Dulaglutide (TRULICITY) 0.75 MG/0.5ML SOPN, Inject 0.75 mg into the skin once a week. for diabetes., Disp: 6 mL, Rfl: 1 ?  furosemide (LASIX) 40 MG tablet, Take 1 tablet (40 mg total) by mouth daily., Disp: 90 tablet, Rfl: 3 ?  glipiZIDE (GLUCOTROL XL) 10 MG 24 hr tablet, TAKE 1 TABLET BY MOUTH DAILY WITH BREAKFAST. FOR DIABETES., Disp: 90 tablet, Rfl: 1 ?  losartan (COZAAR) 100 MG tablet, Take 1 tablet (100 mg total) by mouth daily., Disp: 90 tablet, Rfl: 3 ?  metFORMIN (GLUCOPHAGE) 1000 MG tablet, TAKE 1 TABLET (1,000 MG TOTAL) BY MOUTH 2 (TWO) TIMES DAILY WITH A MEAL. FOR DIABETES., Disp: 180 tablet, Rfl: 1 ?  spironolactone (ALDACTONE) 25 MG tablet, Take 1 tablet (25 mg total) by mouth 2 (two) times daily., Disp: 180 tablet, Rfl: 3 ?  tadalafil (CIALIS) 5 MG tablet, TAKE 1 TABLET BY MOUTH DAILY AS NEEDED FOR ERECTILE DYSFUNCTION., Disp: 30 tablet, Rfl: 0 ?No current facility-administered medications for this visit. ? ?Facility-Administered Medications Ordered in Other Visits:  ?  acetaminophen (TYLENOL) tablet 650 mg, 650 mg, Oral, Q4H PRN, Mariah Milling, Tollie Pizza, MD  ? ?Medications ordered in this encounter:  ?No orders of the defined types  were placed in this encounter. ?  ? ?*If you need refills on other medications prior to your next appointment, please contact your pharmacy* ? ?Follow-Up: ?Call back or seek an in-person evaluation if the symptoms worsen or if the condition fails to improve as anticipated. ? ?Other Instructions ?Please keep well-hydrated and get plenty of rest. ?Start a saline nasal rinse to flush out your nasal passages. ?You can use plain Mucinex to help thin congestion. ?If you have a humidifier, running in the bedroom at night. ?I want you to start OTC vitamin D3 1000 units daily, vitamin C 1000 mg daily, and a zinc supplement. ?Please take prescribed medications as directed. ? ?You have been enrolled in a MyChart symptom monitoring program. ?Please answer these questions daily so we can keep track of how you are doing. ? ?You were to quarantine for 5 days from onset of your symptoms.  After day 5, if you have had no fever and you are feeling better, you can end quarantine but need to mask for an additional 5 days. ?After day 5 if you have a fever or are having significant symptoms, please quarantine for full 10 days. ? ?If you note any worsening of symptoms, any significant shortness of breath or any chest pain, please seek ER evaluation ASAP.  Please do not delay care! ? ?COVID-19: What to Do if You Are Sick ?If you test positive and are an older adult or someone who is at  high risk of getting very sick from COVID-19, treatment may be available. Contact a healthcare provider right away after a positive test to determine if you are eligible, even if your symptoms are mild right now. You can also visit a Test to Treat location and, if eligible, receive a prescription from a provider. Don't delay: Treatment must be started within the first few days to be effective. ?If you have a fever, cough, or other symptoms, you might have COVID-19. Most people have mild illness and are able to recover at home. If you are sick: ?Keep track of  your symptoms. ?If you have an emergency warning sign (including trouble breathing), call 911. ?Steps to help prevent the spread of COVID-19 if you are sick ?If you are sick with COVID-19 or think you might have COVID-19, follow the steps below to care for yourself and to help protect other people in your home and community. ?Stay home except to get medical care ?Stay home. Most people with COVID-19 have mild illness and can recover at home without medical care. Do not leave your home, except to get medical care. Do not visit public areas and do not go to places where you are unable to wear a mask. ?Take care of yourself. Get rest and stay hydrated. Take over-the-counter medicines, such as acetaminophen, to help you feel better. ?Stay in touch with your doctor. Call before you get medical care. Be sure to get care if you have trouble breathing, or have any other emergency warning signs, or if you think it is an emergency. ?Avoid public transportation, ride-sharing, or taxis if possible. ?Get tested ?If you have symptoms of COVID-19, get tested. While waiting for test results, stay away from others, including staying apart from those living in your household. ?Get tested as soon as possible after your symptoms start. Treatments may be available for people with COVID-19 who are at risk for becoming very sick. Don't delay: Treatment must be started early to be effective--some treatments must begin within 5 days of your first symptoms. Contact your healthcare provider right away if your test result is positive to determine if you are eligible. ?Self-tests are one of several options for testing for the virus that causes COVID-19 and may be more convenient than laboratory-based tests and point-of-care tests. Ask your healthcare provider or your local health department if you need help interpreting your test results. ?You can visit your state, tribal, local, and territorial health department's website to look for the  latest local information on testing sites. ?Separate yourself from other people ?As much as possible, stay in a specific room and away from other people and pets in your home. If possible, you should use a separate bathroom. If you need to be around other people or animals in or outside of the home, wear a well-fitting mask. ?Tell your close contacts that they may have been exposed to COVID-19. An infected person can spread COVID-19 starting 48 hours (or 2 days) before the person has any symptoms or tests positive. By letting your close contacts know they may have been exposed to COVID-19, you are helping to protect everyone. ?See COVID-19 and Animals if you have questions about pets. ?If you are diagnosed with COVID-19, someone from the health department may call you. Answer the call to slow the spread. ?Monitor your symptoms ?Symptoms of COVID-19 include fever, cough, or other symptoms. ?Follow care instructions from your healthcare provider and local health department. Your local health authorities may give instructions on checking  your symptoms and reporting information. ?When to seek emergency medical attention ?Look for emergency warning signs* for COVID-19. If someone is showing any of these signs, seek emergency medical care immediately: ?Trouble breathing ?Persistent pain or pressure in the chest ?New confusion ?Inability to wake or stay awake ?Pale, gray, or blue-colored skin, lips, or nail beds, depending on skin tone ?*This list is not all possible symptoms. Please call your medical provider for any other symptoms that are severe or concerning to you. ?Call 911 or call ahead to your local emergency facility: Notify the operator that you are seeking care for someone who has or may have COVID-19. ?Call ahead before visiting your doctor ?Call ahead. Many medical visits for routine care are being postponed or done by phone or telemedicine. ?If you have a medical appointment that cannot be postponed, call  your doctor's office, and tell them you have or may have COVID-19. This will help the office protect themselves and other patients. ?If you are sick, wear a well-fitting mask ?You should wear a mask if you mu

## 2021-10-13 NOTE — Progress Notes (Signed)
?Virtual Visit Consent  ? ?Dennis Curry, you are scheduled for a virtual visit with a [facility] provider today.   ?  ?Just as with appointments in the office, your consent must be obtained to participate.  Your consent will be active for this visit and any virtual visit you may have with one of our providers in the next 365 days.   ?  ?If you have a MyChart account, a copy of this consent can be sent to you electronically.  All virtual visits are billed to your insurance company just like a traditional visit in the office.   ? ?As this is a virtual visit, video technology does not allow for your provider to perform a traditional examination.  This may limit your provider's ability to fully assess your condition.  If your provider identifies any concerns that need to be evaluated in person or the need to arrange testing (such as labs, EKG, etc.), we will make arrangements to do so.   ?  ?Although advances in technology are sophisticated, we cannot ensure that it will always work on either your end or our end.  If the connection with a video visit is poor, the visit may have to be switched to a telephone visit.  With either a video or telephone visit, we are not always able to ensure that we have a secure connection.    ? ?I need to obtain your verbal consent now.   Are you willing to proceed with your visit today?  ?  ?Dennis Curry has provided verbal consent on 10/13/2021 for a virtual visit (video or telephone). ?  ?Dennis Climes, PA-C  ? ?Date: 10/13/2021 7:54 AM ? ? ?Virtual Visit via Video Note  ? ?Dennis Curry, Dennis Curry, connected with  Dennis Curry  ([id number], [birth year]) on 10/13/21 at  7:45 AM EDT by a video-enabled telemedicine application and verified that I am speaking with the correct person using two identifiers. ? ?Location: ?Patient: Virtual Visit Location Patient: Home ?Provider: Virtual Visit Location Provider: Home Office ?  ?I discussed the limitations of evaluation and management by  telemedicine and the availability of in person appointments. The patient expressed understanding and agreed to proceed.   ? ?History of Present Illness: ?Dennis Curry is a 56 y.o. who identifies as a male who was assigned male at birth, and is being seen today for COVID-19. Patient endorses symptoms starting yesterday with sore throat, aches and  fever (Tmax 103).  Now with chills and some head congestion. Denies any significant chest congestion, any SOB or chest pain. Denies GI symptoms. Took a home COVID test yesterday which was positive. Believes this is his first COVID infection.  Is taking Tylenol/Ibuprofen to help with fever with good results. Also starting OTC Vitamin D and Echinacea OTC.  ? ?HPI: HPI  ?Problems:  ?Patient Active Problem List  ? Diagnosis Date Noted  ? Thyroid nodule 01/01/2019  ? Erectile dysfunction 01/01/2019  ? Hyperlipidemia 09/08/2015  ? Preventative health care 09/08/2015  ? Hypokalemia 01/18/2015  ? Chronic systolic heart failure (HCC) 12/18/2014  ? Type 2 diabetes mellitus (HCC) 12/02/2014  ? Nonischemic dilated cardiomyopathy (HCC)   ? Aortic valve disorder   ? Aneurysm, ascending aorta (HCC)   ? Essential hypertension 11/30/2014  ?  ?Allergies: No Known Allergies ?Medications:  ?Current Outpatient Medications:  ?  molnupiravir EUA (LAGEVRIO) 200 mg CAPS capsule, Take 4 capsules (800 mg total) by mouth 2 (two) times daily for 5 days., Disp: 40 capsule,  Rfl: 0 ?  aspirin 81 MG tablet, Take 81 mg by mouth daily., Disp: , Rfl:  ?  atorvastatin (LIPITOR) 40 MG tablet, Take 1 tablet (40 mg total) by mouth at bedtime., Disp: 90 tablet, Rfl: 3 ?  carvedilol (COREG) 6.25 MG tablet, Take 1 tablet (6.25 mg total) by mouth 2 (two) times daily with a meal., Disp: 180 tablet, Rfl: 3 ?  Dulaglutide (TRULICITY) 0.75 MG/0.5ML SOPN, Inject 0.75 mg into the skin once a week. for diabetes., Disp: 6 mL, Rfl: 1 ?  furosemide (LASIX) 40 MG tablet, Take 1 tablet (40 mg total) by mouth daily., Disp: 90  tablet, Rfl: 3 ?  glipiZIDE (GLUCOTROL XL) 10 MG 24 hr tablet, TAKE 1 TABLET BY MOUTH DAILY WITH BREAKFAST. FOR DIABETES., Disp: 90 tablet, Rfl: 1 ?  losartan (COZAAR) 100 MG tablet, Take 1 tablet (100 mg total) by mouth daily., Disp: 90 tablet, Rfl: 3 ?  metFORMIN (GLUCOPHAGE) 1000 MG tablet, TAKE 1 TABLET (1,000 MG TOTAL) BY MOUTH 2 (TWO) TIMES DAILY WITH A MEAL. FOR DIABETES., Disp: 180 tablet, Rfl: 1 ?  spironolactone (ALDACTONE) 25 MG tablet, Take 1 tablet (25 mg total) by mouth 2 (two) times daily., Disp: 180 tablet, Rfl: 3 ?  tadalafil (CIALIS) 5 MG tablet, TAKE 1 TABLET BY MOUTH DAILY AS NEEDED FOR ERECTILE DYSFUNCTION., Disp: 30 tablet, Rfl: 0 ?No current facility-administered medications for this visit. ? ?Facility-Administered Medications Ordered in Other Visits:  ?  acetaminophen (TYLENOL) tablet 650 mg, 650 mg, Oral, Q4H PRN, Mariah Milling, Tollie Pizza, MD ? ?Observations/Objective: ?Patient is well-developed, well-nourished in no acute distress.  ?Resting comfortably at home.  ?Head is normocephalic, atraumatic.  ?No labored breathing. ?Speech is clear and coherent with logical content.  ?Patient is alert and oriented at baseline.  ? ?Assessment and Plan: ?1. COVID-19 ?- molnupiravir EUA (LAGEVRIO) 200 mg CAPS capsule; Take 4 capsules (800 mg total) by mouth 2 (two) times daily for 5 days.  Dispense: 40 capsule; Refill: 0 ?- MyChart COVID-19 home monitoring program; Future ? ?Patient with multiple risk factors for complicated course of illness. Discussed risks/benefits of antiviral medications including most common potential ADRs. Patient voiced understanding and would like to proceed with antiviral medication. They are candidate for antivirals -- Molnupiravir. He would need renal panel for Paxlovid and he does not wish to do this at present time. Rx Molnupiravir sent to pharmacy. Supportive measures, OTC medications and vitamin regimen reviewed. Patient has been enrolled in a MyChart COVID symptom monitoring  program. Anne Shutter reviewed in detail. Strict ER precautions discussed with patient.  ? ? ?Follow Up Instructions: ?I discussed the assessment and treatment plan with the patient. The patient was provided an opportunity to ask questions and all were answered. The patient agreed with the plan and demonstrated an understanding of the instructions.  A copy of instructions were sent to the patient via MyChart unless otherwise noted below.  ? ?The patient was advised to call back or seek an in-person evaluation if the symptoms worsen or if the condition fails to improve as anticipated. ? ?Time:  ?I spent 10 minutes with the patient via telehealth technology discussing the above problems/concerns.   ? ?Dennis Climes, PA-C ?

## 2021-10-24 ENCOUNTER — Encounter (INDEPENDENT_AMBULATORY_CARE_PROVIDER_SITE_OTHER): Payer: Self-pay

## 2021-10-26 ENCOUNTER — Encounter (INDEPENDENT_AMBULATORY_CARE_PROVIDER_SITE_OTHER): Payer: Self-pay

## 2021-11-06 ENCOUNTER — Other Ambulatory Visit: Payer: Self-pay | Admitting: Primary Care

## 2021-11-06 DIAGNOSIS — N529 Male erectile dysfunction, unspecified: Secondary | ICD-10-CM

## 2021-11-07 ENCOUNTER — Telehealth: Payer: Self-pay | Admitting: Pharmacy Technician

## 2021-11-07 NOTE — Telephone Encounter (Signed)
Received updated proof of income.  Patient eligible to receive medication assistance at [facility] until time for re-certification in 2024, and as long as eligibility requirements continue to be met. ? ?Denine Brotz J. Trashawn Oquendo ?Care Manager ?[facility]  ?

## 2022-02-06 ENCOUNTER — Telehealth: Payer: Self-pay | Admitting: Cardiovascular Disease

## 2022-02-06 DIAGNOSIS — I420 Dilated cardiomyopathy: Secondary | ICD-10-CM

## 2022-02-06 NOTE — Telephone Encounter (Signed)
Patient calling to discuss pre ov testing .  Please advise if needing anything done before visit .  Patient says he usually has an echo.

## 2022-02-12 ENCOUNTER — Other Ambulatory Visit: Payer: Self-pay | Admitting: Primary Care

## 2022-02-12 DIAGNOSIS — N529 Male erectile dysfunction, unspecified: Secondary | ICD-10-CM

## 2022-02-22 ENCOUNTER — Other Ambulatory Visit: Payer: Self-pay | Admitting: Cardiovascular Disease

## 2022-03-03 ENCOUNTER — Ambulatory Visit (INDEPENDENT_AMBULATORY_CARE_PROVIDER_SITE_OTHER): Payer: 59

## 2022-03-03 DIAGNOSIS — I420 Dilated cardiomyopathy: Secondary | ICD-10-CM

## 2022-03-03 LAB — ECHOCARDIOGRAM COMPLETE
AR max vel: 4.79 cm2
AV Area VTI: 4.31 cm2
AV Area mean vel: 4.67 cm2
AV Mean grad: 2 mmHg
AV Peak grad: 4.6 mmHg
Ao pk vel: 1.07 m/s
Area-P 1/2: 1.8 cm2
P 1/2 time: 334 msec
S' Lateral: 3.6 cm
Single Plane A4C EF: 55.2 %

## 2022-03-03 MED ORDER — PERFLUTREN LIPID MICROSPHERE
1.0000 mL | INTRAVENOUS | Status: AC | PRN
  Administered 2022-03-03: 2 mL via INTRAVENOUS

## 2022-03-07 ENCOUNTER — Ambulatory Visit (INDEPENDENT_AMBULATORY_CARE_PROVIDER_SITE_OTHER): Payer: 59 | Admitting: Primary Care

## 2022-03-07 ENCOUNTER — Other Ambulatory Visit: Payer: Self-pay | Admitting: Primary Care

## 2022-03-07 VITALS — BP 120/64 | HR 66 | Temp 98.7°F | Ht 72.0 in | Wt 218.0 lb

## 2022-03-07 DIAGNOSIS — E041 Nontoxic single thyroid nodule: Secondary | ICD-10-CM

## 2022-03-07 DIAGNOSIS — I359 Nonrheumatic aortic valve disorder, unspecified: Secondary | ICD-10-CM

## 2022-03-07 DIAGNOSIS — Z0000 Encounter for general adult medical examination without abnormal findings: Secondary | ICD-10-CM | POA: Diagnosis not present

## 2022-03-07 DIAGNOSIS — I7121 Aneurysm of the ascending aorta, without rupture: Secondary | ICD-10-CM | POA: Diagnosis not present

## 2022-03-07 DIAGNOSIS — I5022 Chronic systolic (congestive) heart failure: Secondary | ICD-10-CM

## 2022-03-07 DIAGNOSIS — I10 Essential (primary) hypertension: Secondary | ICD-10-CM | POA: Diagnosis not present

## 2022-03-07 DIAGNOSIS — N529 Male erectile dysfunction, unspecified: Secondary | ICD-10-CM

## 2022-03-07 DIAGNOSIS — E1159 Type 2 diabetes mellitus with other circulatory complications: Secondary | ICD-10-CM

## 2022-03-07 DIAGNOSIS — E782 Mixed hyperlipidemia: Secondary | ICD-10-CM

## 2022-03-07 DIAGNOSIS — Z125 Encounter for screening for malignant neoplasm of prostate: Secondary | ICD-10-CM

## 2022-03-07 LAB — COMPREHENSIVE METABOLIC PANEL
ALT: 23 U/L (ref 0–53)
AST: 15 U/L (ref 0–37)
Albumin: 4.5 g/dL (ref 3.5–5.2)
Alkaline Phosphatase: 71 U/L (ref 39–117)
BUN: 17 mg/dL (ref 6–23)
CO2: 24 mEq/L (ref 19–32)
Calcium: 9.7 mg/dL (ref 8.4–10.5)
Chloride: 100 mEq/L (ref 96–112)
Creatinine, Ser: 0.99 mg/dL (ref 0.40–1.50)
GFR: 85.2 mL/min (ref >60.00)
Glucose, Bld: 147 mg/dL — ABNORMAL HIGH (ref 70–99)
Potassium: 4.1 mEq/L (ref 3.5–5.1)
Sodium: 137 mEq/L (ref 135–145)
Total Bilirubin: 0.8 mg/dL (ref 0.2–1.2)
Total Protein: 7.1 g/dL (ref 6.0–8.3)

## 2022-03-07 LAB — CBC
HCT: 39.8 % (ref 39.0–52.0)
Hemoglobin: 13.5 g/dL (ref 13.0–17.0)
MCHC: 34.1 g/dL (ref 30.0–36.0)
MCV: 86.3 fl (ref 78.0–100.0)
Platelets: 234 10*3/uL (ref 150.0–400.0)
RBC: 4.61 Mil/uL (ref 4.22–5.81)
RDW: 13.7 % (ref 11.5–15.5)
WBC: 8.3 10*3/uL (ref 4.0–10.5)

## 2022-03-07 LAB — LDL CHOLESTEROL, DIRECT: Direct LDL: 62 mg/dL

## 2022-03-07 LAB — LIPID PANEL
Cholesterol: 128 mg/dL (ref 0–200)
HDL: 31.1 mg/dL — ABNORMAL LOW (ref >39.00)
NonHDL: 97.36
Total CHOL/HDL Ratio: 4
Triglycerides: 329 mg/dL — ABNORMAL HIGH (ref 0.0–149.0)
VLDL: 65.8 mg/dL — ABNORMAL HIGH (ref 0.0–40.0)

## 2022-03-07 LAB — PSA: PSA: 0.25 ng/mL (ref 0.10–4.00)

## 2022-03-07 LAB — HEMOGLOBIN A1C: Hgb A1c MFr Bld: 7.2 % — ABNORMAL HIGH (ref 4.6–6.5)

## 2022-03-07 NOTE — Assessment & Plan Note (Signed)
Controlled.  Continue tadalafil 5 mg daily as needed.

## 2022-03-07 NOTE — Assessment & Plan Note (Signed)
Reviewed thyroid ultrasound from September 2022, thyroid nodule determined benign given 5 years of stability.  Discussed with patient today. No further imaging required

## 2022-03-07 NOTE — Progress Notes (Signed)
Subjective:    Patient ID: Dennis Curry, male    DOB: 1965-11-25, 56 y.o.   MRN: [id number]  HPI  Dennis Curry is a very pleasant 56 y.o. male who presents today for complete physical and follow up of chronic conditions.  Immunizations: -Tetanus: 2021 -Influenza: Declines  -Covid-19: 2 vaccines -Shingles: Completed Shingrix -Pneumonia: 2019  Diet: Fair diet.  Exercise: No regular exercise.  Eye exam: Completes annually  Dental exam: Completes semi-annually   Colonoscopy: Completed in 2017, due 2027  PSA: Due  BP Readings from Last 3 Encounters:  03/07/22 120/64  09/06/21 124/68  04/06/21 120/70          Review of Systems  Constitutional:  Negative for unexpected weight change.  HENT:  Negative for rhinorrhea.   Respiratory:  Negative for cough and shortness of breath.   Cardiovascular:  Negative for chest pain.  Gastrointestinal:  Negative for constipation and diarrhea.  Genitourinary:  Negative for difficulty urinating.  Musculoskeletal:  Negative for arthralgias.  Skin:  Negative for rash.  Allergic/Immunologic: Negative for environmental allergies.  Neurological:  Negative for dizziness, numbness and headaches.  Psychiatric/Behavioral:  The patient is not nervous/anxious.          Past Medical History:  Diagnosis Date   Aortic insufficiency    a. TTE 11/30/14: EF 25-30%, bicupid valve cannot be excluded, mod-severe aortic regurge, aortic root dimension 52 mm, mild MR, mildly dilated LA/RA, PASP 60; TEE 12/01/14: EF 25-30%, diffuse HK, aortic valve trileaflet, mild to mod Ao regurge, ascending aortic grossly measured at 4.5 cm (not well visualized),    Ascending aorta dilatation (HCC)    a. 52 mm by TTE 11/2014; b. 45 mm by TEE 11/2014 (not well visualized)   Chronic systolic CHF (congestive heart failure) (HCC)    a. TTE 11/30/14: EF 25-30%, bicupid valve cannot be excluded, mod-severe aortic regurge, aortic root dimension 52 mm, mild MR, mildly  dilated LA/RA, PASP 60; TEE 12/01/14: EF 25-30%, diffuse HK, aortic valve trileaflet, mild to mod Ao regurge, ascending aortic grossly measured at 4.5 cm (not well visualized),    Diabetes mellitus without complication (HCC)    Hyperlipidemia    Hypertension    Influenza A 09/02/2018   Nonischemic dilated cardiomyopathy (HCC)    a. cath 12/02/14: right dom coronary system w/ mild lumenal irregs, o/w no sig CAD, sev diffuse HK EF 25%, no sig MR or AS, med Rx recommeded     Social History   Socioeconomic History   Marital status: Married    Spouse name: Not on file   Number of children: Not on file   Years of education: Not on file   Highest education level: Not on file  Occupational History   Not on file  Tobacco Use   Smoking status: Never   Smokeless tobacco: Never  Substance and Sexual Activity   Alcohol use: No    Alcohol/week: 0.0 standard drinks of alcohol   Drug use: No   Sexual activity: Not on file  Other Topics Concern   Not on file  Social History Narrative   Married.   Works as a Engineer, structural at [employer].   No children.   Enjoys playing guitar, traveling, spending time with his wife.   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     atorvastatin (LIPITOR) 40 MG tablet Take 1 tablet (40 mg total) by mouth at bedtime. 90 tablet 3   carvedilol (COREG) 6.25 MG tablet Take 1 tablet (6.25 mg total) by mouth 2 (two) times daily with a meal. 180 tablet 3   Dulaglutide (TRULICITY) 0.75 MG/0.5ML SOPN Inject 0.75 mg into the skin once a week. for diabetes. 6 mL 1   furosemide (LASIX) 40 MG tablet TAKE 1 TABLET BY MOUTH EVERY DAY 90 tablet 0   glipiZIDE (GLUCOTROL XL) 10 MG 24 hr tablet TAKE 1 TABLET BY MOUTH DAILY WITH BREAKFAST. FOR DIABETES. 90 tablet 1   losartan (COZAAR) 100 MG tablet TAKE 1 TABLET BY MOUTH EVERY DAY 90 tablet 0   metFORMIN (GLUCOPHAGE) 1000 MG tablet TAKE 1 TABLET (1,000 MG TOTAL) BY MOUTH 2 (TWO) TIMES DAILY WITH A MEAL. FOR DIABETES. 180 tablet 1   spironolactone (ALDACTONE) 25 MG tablet Take 1 tablet (25 mg total) by mouth 2 (two) times daily. 180 tablet 3   tadalafil (CIALIS) 5 MG tablet TAKE 1 TABLET BY MOUTH DAILY AS NEEDED FOR ERECTILE DYSFUNCTION. 30 tablet 0   Current Facility-Administered Medications on File Prior to Visit  Medication Dose Route Frequency Provider Last Rate Last Admin   acetaminophen (TYLENOL) tablet 650 mg  650 mg Oral Q4H PRN Gollan, Tollie Pizza, MD        BP 120/64   Pulse 66   Temp 98.7 F (37.1 C) (Oral)   Ht 6' (1.829 m)   Wt 218 lb (98.9 kg)   SpO2 98%   BMI 29.57 kg/m  Objective:   Physical Exam HENT:     Right Ear: Tympanic membrane and ear canal normal.     Left Ear: Tympanic membrane and ear canal normal.     Nose: Nose normal.     Right Sinus: No maxillary sinus tenderness or frontal sinus tenderness.     Left Sinus: No maxillary sinus tenderness or frontal sinus tenderness.  Eyes:     Conjunctiva/sclera: Conjunctivae normal.  Neck:     Thyroid: No  thyromegaly.     Vascular: No carotid bruit.  Cardiovascular:     Rate and Rhythm: Normal rate and regular rhythm.     Heart sounds: Normal heart sounds.  Pulmonary:     Effort: Pulmonary effort is normal.     Breath sounds: Normal breath sounds. No wheezing or rales.  Abdominal:     General: Bowel sounds are normal.     Palpations: Abdomen is soft.     Tenderness: There is no abdominal tenderness.  Musculoskeletal:  General: Normal range of motion.     Cervical back: Neck supple.  Skin:    General: Skin is warm and dry.  Neurological:     Mental Status: He is alert and oriented to person, place, and time.     Cranial Nerves: No cranial nerve deficit.     Deep Tendon Reflexes: Reflexes are normal and symmetric.  Psychiatric:        Mood and Affect: Mood normal.           Assessment & Plan:   Problem List Items Addressed This Visit       Cardiovascular and Mediastinum   Chronic systolic heart failure (HCC) (Chronic)    Appears euvolemic today. Following with cardiology.  Continue furosemide 40 mg daily. CMP pending today      Essential hypertension    Controlled.  Continue carvedilol 6.25 mg twice daily, losartan 100 mg daily, spironolactone 25 mg 2 times daily. CMP pending.      Relevant Orders   Comprehensive metabolic panel   CBC   Aortic valve disorder    Reviewed echocardiogram from August 2023. Following with cardiology.       Aneurysm, ascending aorta (HCC)    Stable.  Reviewed echocardiogram from August 2023. Following with cardiology.           Endocrine   Type 2 diabetes mellitus (HCC)    Repeat A1c pending today.  Continue Trulicity 0.75 mg weekly, metformin 1000 mg twice daily, glipizide XL 10 mg daily.  Foot exam today. Managed on statin and ARB. Pneumonia vaccine up-to-date. Eye exam due next month.      Relevant Orders   Hemoglobin A1c   Thyroid nodule    Reviewed thyroid ultrasound from September 2022, thyroid  nodule determined benign given 5 years of stability.  Discussed with patient today. No further imaging required        Other   Hyperlipidemia    Repeat lipid panel pending. Continue atorvastatin 40 mg daily.      Relevant Orders   Lipid panel   Preventative health care - Primary    Immunizations UTD. Declines influenza vaccine. PSA due and pending. Colonoscopy up-to-date, due 2027.  Discussed the importance of a healthy diet and regular exercise in order for weight loss, and to reduce the risk of further co-morbidity.  Exam stable. Labs pending.  Follow up in 1 year for repeat physical.       Erectile dysfunction    Controlled.  Continue tadalafil 5 mg daily as needed.      Other Visit Diagnoses     Screening for prostate cancer       Relevant Orders   PSA          [name], NP

## 2022-03-07 NOTE — Assessment & Plan Note (Signed)
Reviewed echocardiogram from August 2023. Following with cardiology.

## 2022-03-07 NOTE — Assessment & Plan Note (Signed)
Stable.  Reviewed echocardiogram from August 2023. Following with cardiology.

## 2022-03-07 NOTE — Assessment & Plan Note (Signed)
Immunizations UTD. Declines influenza vaccine. PSA due and pending. Colonoscopy up-to-date, due 2027.  Discussed the importance of a healthy diet and regular exercise in order for weight loss, and to reduce the risk of further co-morbidity.  Exam stable. Labs pending.  Follow up in 1 year for repeat physical.

## 2022-03-07 NOTE — Assessment & Plan Note (Signed)
Controlled.  Continue carvedilol 6.25 mg twice daily, losartan 100 mg daily, spironolactone 25 mg 2 times daily. CMP pending.

## 2022-03-07 NOTE — Assessment & Plan Note (Signed)
Repeat A1c pending today.  Continue Trulicity 0.75 mg weekly, metformin 1000 mg twice daily, glipizide XL 10 mg daily.  Foot exam today. Managed on statin and ARB. Pneumonia vaccine up-to-date. Eye exam due next month.

## 2022-03-07 NOTE — Assessment & Plan Note (Signed)
Appears euvolemic today. Following with cardiology.  Continue furosemide 40 mg daily. CMP pending today

## 2022-03-07 NOTE — Patient Instructions (Signed)
Stop by the lab prior to leaving today. I will notify you of your results once received.   It was a pleasure to see you today!  Preventive Care 40-56 Years Old, Male Preventive care refers to lifestyle choices and visits with your health care provider that can promote health and wellness. Preventive care visits are also called wellness exams. What can I expect for my preventive care visit? Counseling During your preventive care visit, your health care provider may ask about your: Medical history, including: Past medical problems. Family medical history. Current health, including: Emotional well-being. Home life and relationship well-being. Sexual activity. Lifestyle, including: Alcohol, nicotine or tobacco, and drug use. Access to firearms. Diet, exercise, and sleep habits. Safety issues such as seatbelt and bike helmet use. Sunscreen use. Work and work environment. Physical exam Your health care provider will check your: Height and weight. These may be used to calculate your BMI (body mass index). BMI is a measurement that tells if you are at a healthy weight. Waist circumference. This measures the distance around your waistline. This measurement also tells if you are at a healthy weight and may help predict your risk of certain diseases, such as type 2 diabetes and high blood pressure. Heart rate and blood pressure. Body temperature. Skin for abnormal spots. What immunizations do I need?  Vaccines are usually given at various ages, according to a schedule. Your health care provider will recommend vaccines for you based on your age, medical history, and lifestyle or other factors, such as travel or where you work. What tests do I need? Screening Your health care provider may recommend screening tests for certain conditions. This may include: Lipid and cholesterol levels. Diabetes screening. This is done by checking your blood sugar (glucose) after you have not eaten for a while  (fasting). Hepatitis B test. Hepatitis C test. HIV (human immunodeficiency virus) test. STI (sexually transmitted infection) testing, if you are at risk. Lung cancer screening. Prostate cancer screening. Colorectal cancer screening. Talk with your health care provider about your test results, treatment options, and if necessary, the need for more tests. Follow these instructions at home: Eating and drinking  Eat a diet that includes fresh fruits and vegetables, whole grains, lean protein, and low-fat dairy products. Take vitamin and mineral supplements as recommended by your health care provider. Do not drink alcohol if your health care provider tells you not to drink. If you drink alcohol: Limit how much you have to 0-2 drinks a day. Know how much alcohol is in your drink. In the [place] one drink equals one 12 oz bottle of beer (355 mL), one 5 oz glass of wine (148 mL), or one 1 oz glass of hard liquor (44 mL). Lifestyle Brush your teeth every morning and night with fluoride toothpaste. Floss one time each day. Exercise for at least 30 minutes 5 or more days each week. Do not use any products that contain nicotine or tobacco. These products include cigarettes, chewing tobacco, and vaping devices, such as e-cigarettes. If you need help quitting, ask your health care provider. Do not use drugs. If you are sexually active, practice safe sex. Use a condom or other form of protection to prevent STIs. Take aspirin only as told by your health care provider. Make sure that you understand how much to take and what form to take. Work with your health care provider to find out whether it is safe and beneficial for you to take aspirin daily. Find healthy ways to manage   stress, such as: Meditation, yoga, or listening to music. Journaling. Talking to a trusted person. Spending time with friends and family. Minimize exposure to UV radiation to reduce your risk of skin cancer. Safety Always wear  your seat belt while driving or riding in a vehicle. Do not drive: If you have been drinking alcohol. Do not ride with someone who has been drinking. When you are tired or distracted. While texting. If you have been using any mind-altering substances or drugs. Wear a helmet and other protective equipment during sports activities. If you have firearms in your house, make sure you follow all gun safety procedures. What's next? Go to your health care provider once a year for an annual wellness visit. Ask your health care provider how often you should have your eyes and teeth checked. Stay up to date on all vaccines. This information is not intended to replace advice given to you by your health care provider. Make sure you discuss any questions you have with your health care provider. Document Revised: 12/22/2020 Document Reviewed: 12/22/2020 Elsevier Patient Education  2023 Elsevier Inc.  

## 2022-03-07 NOTE — Assessment & Plan Note (Signed)
Repeat lipid panel pending. Continue atorvastatin 40 mg daily. 

## 2022-03-08 ENCOUNTER — Other Ambulatory Visit: Payer: Self-pay | Admitting: Primary Care

## 2022-03-08 DIAGNOSIS — E1159 Type 2 diabetes mellitus with other circulatory complications: Secondary | ICD-10-CM

## 2022-03-08 DIAGNOSIS — E1165 Type 2 diabetes mellitus with hyperglycemia: Secondary | ICD-10-CM

## 2022-03-21 ENCOUNTER — Other Ambulatory Visit: Payer: Self-pay | Admitting: Cardiovascular Disease

## 2022-03-21 DIAGNOSIS — E785 Hyperlipidemia, unspecified: Secondary | ICD-10-CM

## 2022-04-21 ENCOUNTER — Other Ambulatory Visit: Payer: Self-pay | Admitting: Primary Care

## 2022-04-21 DIAGNOSIS — N529 Male erectile dysfunction, unspecified: Secondary | ICD-10-CM

## 2022-04-24 LAB — HM DIABETES EYE EXAM

## 2022-04-25 ENCOUNTER — Encounter: Payer: Self-pay | Admitting: Primary Care

## 2022-05-01 NOTE — Progress Notes (Signed)
Cardiology Office Note  Date:  05/02/2022   ID:  Dennis Curry, DOB [birth year], MRN [id number]  PCP:  Pleas Koch, NP   Chief Complaint  Patient presents with   12 month follow up     "Doing well." Medications reviewed by the patient verbally.     HPI:  56 year old gentleman with PMH of DM II Nonischemic cardiomyopathy EF 25% in 2016 , up to 45 to 50% in 2016, 50 to 55%  In 2017 No CAD on cardiac cath, 2016 Aortic root 47 mm, ascending aorta 45 mm, unchanged dating back to 2016 Aortic valve regurgitation is moderate. , trileaflet aortic valve who presents for routine follow-up of his systolic CHF and dilated ascending aorta  Last seen in clinic by myself September 2022 In follow-up today reports that he feels well Taking Lasix 40 mg daily, denies significant shortness of breath on exertion, no leg swelling, no PND orthopnea  Echo 8/23 results reviewed Appears similar to echo in 2022 EF slightly improved, Moderate aortic valve regurgitation, possible moderate to severe Aorta size has not changed, 4.7 cm  A1c has been trending higher used to be 5.7 now greater than 7 Reports he is going to focus on his diet No regular exercise program  Labs reviewed LDL 62 Total chol 128, A1C 7.2 Creatinine 0.99  EKG personally reviewed by myself on todays visit Shows nsr rate 69 bpm, RBBB, LAFB  Other past medical history reviewed  CT chest: October 2019 1. Stable aneurysmal disease of the thoracic aorta with aortic root diameter of approximately 4.7-4.8 cm at the sinuses of Valsalva and maximal ascending thoracic aorta diameter of 4.5 cm. 2. Coronary atherosclerosis with calcified plaque in the distribution of the LAD and RCA. 3. Aneurysmal disease of the ascending thoracic aorta does exert some mass effect on the SVC causing some apparent narrowing of the distal SVC by CT.   TEE 2015 4.5 aorta ascending Aorta Root 4.4  Echo TTE 2016  Ascending  aorta  4.5  Initially developed progressive chest congestion initially felt to be allergies, treated with several rounds of ABX,  presenting to the hospital with worsening shortness of breath found to have acute systolic CHF, EF 123456, dilated ascending aorta, cardiac catheterization showing no significant CAD, aorta 4.5 cm on TEE,  PMH:   has a past medical history of Aortic insufficiency, Ascending aorta dilatation (HCC), Chronic systolic CHF (congestive heart failure) ([place]), Diabetes mellitus without complication ([place]), Hyperlipidemia, Hypertension, Influenza A (09/02/2018), and Nonischemic dilated cardiomyopathy ([place]).  PSH:    Past Surgical History:  Procedure Laterality Date   CARDIAC CATHETERIZATION N/A 12/02/2014   Procedure: Left Heart Cath and Coronary Angiography;  Surgeon: Minna Merritts, MD;  Location: [facility];  Service: Cardiovascular;  Laterality: N/A;   COLONOSCOPY  1992   mandatory w [employer] where pt emp   CORONARY ANGIOPLASTY     OTHER SURGICAL HISTORY     none    Current Outpatient Medications  Medication Sig Dispense Refill   aspirin 81 MG tablet Take 81 mg by mouth daily.     atorvastatin (LIPITOR) 40 MG tablet TAKE 1 TABLET BY MOUTH EVERYDAY AT BEDTIME 30 tablet 2   carvedilol (COREG) 6.25 MG tablet Take 1 tablet (6.25 mg total) by mouth 2 (two) times daily with a meal. 180 tablet 3   Dulaglutide (TRULICITY) A999333 0000000 SOPN INJECT 0.75 MG INTO THE SKIN ONCE A WEEK. FOR DIABETES. 6 mL 1   furosemide (LASIX) 40  MG tablet TAKE 1 TABLET BY MOUTH EVERY DAY 90 tablet 0   glipiZIDE (GLUCOTROL XL) 10 MG 24 hr tablet TAKE 1 TABLET BY MOUTH EVERY DAY WITH BREAKFAST FOR DIABETES 90 tablet 1   losartan (COZAAR) 100 MG tablet TAKE 1 TABLET BY MOUTH EVERY DAY 90 tablet 0   metFORMIN (GLUCOPHAGE) 1000 MG tablet TAKE 1 TABLET (1,000 MG TOTAL) BY MOUTH 2 (TWO) TIMES DAILY WITH A MEAL. FOR DIABETES. 180 tablet 1   spironolactone (ALDACTONE) 25 MG tablet Take 1 tablet  (25 mg total) by mouth 2 (two) times daily. 180 tablet 3   tadalafil (CIALIS) 5 MG tablet TAKE 1 TABLET BY MOUTH DAILY AS NEEDED FOR ERECTILE DYSFUNCTION. 8 tablet 3   No current facility-administered medications for this visit.   Facility-Administered Medications Ordered in Other Visits  Medication Dose Route Frequency Provider Last Rate Last Admin   acetaminophen (TYLENOL) tablet 650 mg  650 mg Oral Q4H PRN Yasaman Kolek, Kathlene November, MD         Allergies:   Patient has no known allergies.   Social History:  The patient  reports that he has never smoked. He has never used smokeless tobacco. He reports that he does not drink alcohol and does not use drugs.   Family History:   family history includes Coronary artery disease in his mother; Diabetes Mellitus II in his paternal uncle; Hypertension in his father.    Review of Systems: Review of Systems  Constitutional: Negative.   Respiratory: Negative.    Cardiovascular: Negative.   Gastrointestinal: Negative.   Musculoskeletal: Negative.   Neurological: Negative.   Psychiatric/Behavioral: Negative.    All other systems reviewed and are negative.    PHYSICAL EXAM: VS:  BP 122/70 (BP Location: Left Arm, Patient Position: Sitting, Cuff Size: Normal)   Pulse 69   Ht 6' (1.829 m)   Wt 218 lb (98.9 kg)   SpO2 97%   BMI 29.57 kg/m  , BMI Body mass index is 29.57 kg/m. Constitutional:  oriented to person, place, and time. No distress.  HENT:  Head: Grossly normal Eyes:  no discharge. No scleral icterus.  Neck: No JVD, no carotid bruits  Cardiovascular: Regular rate and rhythm, no murmurs appreciated Pulmonary/Chest: Clear to auscultation bilaterally, no wheezes or rails Abdominal: Soft.  no distension.  no tenderness.  Musculoskeletal: Normal range of motion Neurological:  normal muscle tone. Coordination normal. No atrophy Skin: Skin warm and dry Psychiatric: normal affect, pleasant    Recent Labs: 03/07/2022: ALT 23; BUN 17;  Creatinine, Ser 0.99; Hemoglobin 13.5; Platelets 234.0; Potassium 4.1; Sodium 137    Lipid Panel Lab Results  Component Value Date   CHOL 128 03/07/2022   HDL 31.10 (L) 03/07/2022   TRIG 329.0 (H) 03/07/2022      Wt Readings from Last 3 Encounters:  05/02/22 218 lb (98.9 kg)  03/07/22 218 lb (98.9 kg)  09/06/21 220 lb (99.8 kg)      ASSESSMENT AND PLAN:  Ascending aorta dilation ([place]) - Previous results from 2015, 2016, 2017 2018 reviewed CT scan 2017 and 2019 Stable on echocardiogram in 2022, 2023 Results discussed in detail We will plan on echocardiogram 123456  systolic CHF (congestive heart failure) (HCC) -   ejection fraction 50-55%, unchanged Remains on Lasix 40 daily Appears euvolemic Blood pressure well controlled  Aortic valve disorder -  trileaflet on transesophageal echo Moderate aortic valve regurgitation, minimal murmur on exam, asymptomatic  Essential hypertension -  Blood pressure is well controlled  on today's visit. No changes made to the medications.  Nonischemic dilated cardiomyopathy (HCC) -  Stable ejection fraction 50%-55% We have previously discussed transitioning losartan to Entresto and the addition of Jardiance/Farxiga No medication changes made today  Aneurysm, ascending aorta (HCC) Stable size on recent echocardiogram Continue to monitor on annual basis   Total encounter time more than 30 minutes  Greater than 50% was spent in counseling and coordination of care with the patient   Orders Placed This Encounter  Procedures   EKG 12-Lead     Signed, Esmond Plants, M.D., Ph.D. 05/02/2022  [place], [place]

## 2022-05-02 ENCOUNTER — Ambulatory Visit: Payer: 59 | Attending: Cardiovascular Disease | Admitting: Cardiovascular Disease

## 2022-05-02 ENCOUNTER — Encounter: Payer: Self-pay | Admitting: Cardiovascular Disease

## 2022-05-02 VITALS — BP 122/70 | HR 69 | Ht 72.0 in | Wt 218.0 lb

## 2022-05-02 DIAGNOSIS — E782 Mixed hyperlipidemia: Secondary | ICD-10-CM

## 2022-05-02 DIAGNOSIS — I420 Dilated cardiomyopathy: Secondary | ICD-10-CM

## 2022-05-02 DIAGNOSIS — I77810 Thoracic aortic ectasia: Secondary | ICD-10-CM | POA: Diagnosis not present

## 2022-05-02 DIAGNOSIS — E1159 Type 2 diabetes mellitus with other circulatory complications: Secondary | ICD-10-CM | POA: Diagnosis not present

## 2022-05-02 DIAGNOSIS — E785 Hyperlipidemia, unspecified: Secondary | ICD-10-CM

## 2022-05-02 DIAGNOSIS — I5022 Chronic systolic (congestive) heart failure: Secondary | ICD-10-CM | POA: Diagnosis not present

## 2022-05-02 DIAGNOSIS — I7121 Aneurysm of the ascending aorta, without rupture: Secondary | ICD-10-CM

## 2022-05-02 DIAGNOSIS — I359 Nonrheumatic aortic valve disorder, unspecified: Secondary | ICD-10-CM

## 2022-05-02 DIAGNOSIS — I10 Essential (primary) hypertension: Secondary | ICD-10-CM

## 2022-05-02 MED ORDER — SPIRONOLACTONE 25 MG PO TABS: 25.0000 mg | ORAL_TABLET | Freq: Two times a day (BID) | ORAL | 3 refills | Status: DC

## 2022-05-02 MED ORDER — LOSARTAN POTASSIUM 100 MG PO TABS: 100.0000 mg | ORAL_TABLET | Freq: Every day | ORAL | 3 refills | Status: DC

## 2022-05-02 MED ORDER — CARVEDILOL 6.25 MG PO TABS: 6.2500 mg | ORAL_TABLET | Freq: Two times a day (BID) | ORAL | 3 refills | Status: DC

## 2022-05-02 MED ORDER — ATORVASTATIN CALCIUM 40 MG PO TABS: 40.0000 mg | ORAL_TABLET | Freq: Every day | ORAL | 3 refills | Status: DC

## 2022-05-02 MED ORDER — FUROSEMIDE 40 MG PO TABS: 40.0000 mg | ORAL_TABLET | Freq: Every day | ORAL | 3 refills | Status: DC

## 2022-05-02 NOTE — Patient Instructions (Addendum)
Medication Instructions:  No changes  If you need a refill on your cardiac medications before your next appointment, please call your pharmacy.   Lab work: No new labs needed  Testing/Procedures: Your physician has requested that you have an echocardiogram in 1 year. Echocardiography is a painless test that uses sound waves to create images of your heart. It provides your doctor with information about the size and shape of your heart and how well your heart's chambers and valves are working. This procedure takes approximately one hour. There are no restrictions for this procedure. Please do NOT wear cologne, perfume, aftershave, or lotions (deodorant is allowed). Please arrive 15 minutes prior to your appointment time.   Follow-Up: At [facility], you and your health needs are our priority.  As part of our continuing mission to provide you with exceptional heart care, we have created designated Provider Care Teams.  These Care Teams include your primary Cardiologist (physician) and Advanced Practice Providers (APPs -  Physician Assistants and Nurse Practitioners) who all work together to provide you with the care you need, when you need it.  You will need a follow up appointment in 12 months---after your echo is completed  Providers on your designated Care Team:   Murray Hodgkins, NP Christell Faith, PA-C Cadence Kathlen Mody, [place]  COVID-19 Vaccine Information can be found at: [email or web address] For questions related to vaccine distribution or appointments, please email [email or web address] or call [phone].

## 2022-06-09 ENCOUNTER — Encounter: Payer: Self-pay | Admitting: *Deleted

## 2022-06-09 DIAGNOSIS — Z006 Encounter for examination for normal comparison and control in clinical research program: Secondary | ICD-10-CM

## 2022-06-09 NOTE — Research (Signed)
Message left for Dennis Curry about [employer] research. Encouraged him to call back if interested or with any questions.

## 2022-09-02 ENCOUNTER — Other Ambulatory Visit: Payer: Self-pay | Admitting: Primary Care

## 2022-09-02 DIAGNOSIS — E1159 Type 2 diabetes mellitus with other circulatory complications: Secondary | ICD-10-CM

## 2022-09-03 ENCOUNTER — Other Ambulatory Visit: Payer: Self-pay | Admitting: Primary Care

## 2022-09-03 DIAGNOSIS — E1159 Type 2 diabetes mellitus with other circulatory complications: Secondary | ICD-10-CM

## 2022-09-03 DIAGNOSIS — E1165 Type 2 diabetes mellitus with hyperglycemia: Secondary | ICD-10-CM

## 2022-09-09 ENCOUNTER — Other Ambulatory Visit: Payer: Self-pay | Admitting: Primary Care

## 2022-09-09 DIAGNOSIS — N529 Male erectile dysfunction, unspecified: Secondary | ICD-10-CM

## 2022-09-11 ENCOUNTER — Ambulatory Visit (INDEPENDENT_AMBULATORY_CARE_PROVIDER_SITE_OTHER): Payer: Managed Care, Other (non HMO) | Admitting: Primary Care

## 2022-09-11 ENCOUNTER — Encounter: Payer: Self-pay | Admitting: Primary Care

## 2022-09-11 VITALS — BP 124/70 | HR 70 | Temp 98.0°F | Ht 72.0 in | Wt 217.0 lb

## 2022-09-11 DIAGNOSIS — E1159 Type 2 diabetes mellitus with other circulatory complications: Secondary | ICD-10-CM | POA: Diagnosis not present

## 2022-09-11 LAB — MICROALBUMIN / CREATININE URINE RATIO
Creatinine,U: 63.2 mg/dL
Microalb Creat Ratio: 1.1 mg/g (ref 0.0–30.0)
Microalb, Ur: <0.7 mg/dL (ref 0.0–1.9)

## 2022-09-11 LAB — POCT GLYCOSYLATED HEMOGLOBIN (HGB A1C): Hemoglobin A1C: 7.3 % — AB (ref 4.0–5.6)

## 2022-09-11 LAB — VITAMIN D 25 HYDROXY (VIT D DEFICIENCY, FRACTURES): VITD: 66.18 ng/mL (ref 30.00–100.00)

## 2022-09-11 MED ORDER — TRULICITY 1.5 MG/0.5ML ~~LOC~~ SOAJ: 1.5000 mg | SUBCUTANEOUS | 1 refills | Status: DC

## 2022-09-11 NOTE — Progress Notes (Signed)
Subjective:    Patient ID: Dennis Curry, male    DOB: 1965-08-08, 57 y.o.   MRN: [id number]  HPI  Dennis Curry is a very pleasant 57 y.o. male with a history of type 2 diabetes, hypertension, CHF, nonischemic dilated cardiomyopathy, hyperlipidemia who presents today for follow up of diabetes.  Current medications include: metformin 1000 mg BID, glipizide XL 10 mg daily, Trulicity A999333 mg weekly  He is checking his/her blood glucose 1 times weekly and is getting readings of:  AM fasting: 150's.   Last A1C: 7.2 in August 2023, 7.3 today Last Eye Exam: UTD Last Foot Exam: UTD Pneumonia Vaccination: 2019 Urine Microalbumin: Due Statin: atorvastatin   Dietary changes since last visit: Recently began using an air fryer, cutting back on potatoes and bread.    Exercise: Active at home   BP Readings from Last 3 Encounters:  09/11/22 124/70  05/02/22 122/70  03/07/22 120/64       Review of Systems  Respiratory:  Negative for shortness of breath.   Cardiovascular:  Negative for chest pain.  Endocrine: Negative for polyuria.  Neurological:  Negative for dizziness.         Past Medical History:  Diagnosis Date   Aortic insufficiency    a. TTE 11/30/14: EF 25-30%, bicupid valve cannot be excluded, mod-severe aortic regurge, aortic root dimension 52 mm, mild MR, mildly dilated LA/RA, PASP 60; TEE 12/01/14: EF 25-30%, diffuse HK, aortic valve trileaflet, mild to mod Ao regurge, ascending aortic grossly measured at 4.5 cm (not well visualized),    Ascending aorta dilatation (HCC)    a. 52 mm by TTE 11/2014; b. 45 mm by TEE 11/2014 (not well visualized)   Chronic systolic CHF (congestive heart failure) ([place])    a. TTE 11/30/14: EF 25-30%, bicupid valve cannot be excluded, mod-severe aortic regurge, aortic root dimension 52 mm, mild MR, mildly dilated LA/RA, PASP 60; TEE 12/01/14: EF 25-30%, diffuse HK, aortic valve trileaflet, mild to mod Ao regurge, ascending aortic grossly  measured at 4.5 cm (not well visualized),    Diabetes mellitus without complication ([place])    Hyperlipidemia    Hypertension    Influenza A 09/02/2018   Nonischemic dilated cardiomyopathy ([place])    a. cath 12/02/14: right dom coronary system w/ mild lumenal irregs, o/w no sig CAD, sev diffuse HK EF 25%, no sig MR or AS, med Rx recommeded     Social History   Socioeconomic History   Marital status: Married    Spouse name: Not on file   Number of children: Not on file   Years of education: Not on file   Highest education level: Not on file  Occupational History   Not on file  Tobacco Use   Smoking status: Never   Smokeless tobacco: Never  Vaping Use   Vaping Use: Never used  Substance and Sexual Activity   Alcohol use: No    Alcohol/week: 0.0 standard drinks of alcohol   Drug use: No   Sexual activity: Not on file  Other Topics Concern   Not on file  Social History Narrative   Married.   Works as a Building services engineer at [employer].   No children.   Enjoys playing guitar, traveling, spending time with his wife.   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     atorvastatin (LIPITOR) 40 MG tablet Take 1 tablet (40 mg total) by mouth daily. 90 tablet 3   carvedilol (COREG) 6.25 MG tablet Take 1 tablet (6.25 mg total) by mouth 2 (two) times daily with a meal. 180 tablet 3   furosemide (LASIX) 40 MG tablet Take 1 tablet (40 mg total) by mouth daily. 90 tablet 3   glipiZIDE (GLUCOTROL XL) 10 MG 24 hr tablet TAKE 1 TABLET BY MOUTH EVERY DAY WITH BREAKFAST FOR DIABETES 90 tablet 0   losartan (COZAAR) 100 MG tablet Take 1 tablet (100 mg total) by mouth daily. 90 tablet 3   metFORMIN (GLUCOPHAGE) 1000 MG tablet TAKE 1 TABLET (1,000 MG TOTAL) BY MOUTH 2 (TWO) TIMES DAILY WITH A MEAL. FOR DIABETES. 180 tablet 0   spironolactone (ALDACTONE) 25 MG tablet Take 1 tablet (25 mg total) by mouth 2 (two) times daily. 180 tablet 3   tadalafil (CIALIS) 5 MG tablet TAKE 1 TABLET BY MOUTH DAILY AS NEEDED FOR ERECTILE DYSFUNCTION. 8 tablet 0   Current Facility-Administered Medications on File Prior to Visit  Medication Dose Route Frequency Provider Last Rate Last Admin   acetaminophen (TYLENOL) tablet 650 mg  650 mg Oral Q4H PRN Gollan, Kathlene November, MD        BP 124/70   Pulse 70   Temp 98 F (36.7 C) (Temporal)   Ht 6' (1.829 m)   Wt 217 lb (98.4 kg)   SpO2 98%   BMI 29.43 kg/m  Objective:   Physical Exam Cardiovascular:     Rate and Rhythm: Normal rate and regular rhythm.  Pulmonary:     Effort: Pulmonary effort is normal.     Breath sounds: Normal breath sounds. No wheezing or rales.  Musculoskeletal:     Cervical back: Neck supple.  Skin:    General: Skin is warm and dry.  Neurological:     Mental Status: He is alert and oriented to person, place, and time.           Assessment & Plan:  Type 2 diabetes mellitus with other circulatory complication, without long-term current use of insulin (HCC) Assessment & Plan: A1C today of 7.3, would  like to see him <7. He agrees.  Increase Trulicity to 1.5 mg weekly. Continue Glipizide XL 10 mg daily, metformin 1000 mg BID.  Urine microalbumin due and pending.   Repeat A1C in 3 months. Follow up in 6 months.   Orders: -     POCT glycosylated hemoglobin (Hb A1C) -     Microalbumin / creatinine urine ratio -  Trulicity; Inject 1.5 mg into the skin once a week. for diabetes.  Dispense: 6 mL; Refill: 1 -     VITAMIN D 25 Hydroxy (Vit-D Deficiency, Fractures)        Pleas Koch, NP

## 2022-09-11 NOTE — Patient Instructions (Signed)
We increased the dose of your Trulicity to 1.5 mg weekly for diabetes.  Continue all other diabetes medications.  Stop by the lab prior to leaving today. I will notify you of your results once received.   Set up a lab only appointment for 3 months to recheck your A1c.  Schedule your physical for 6 months.  It was a pleasure to see you today!

## 2022-09-11 NOTE — Assessment & Plan Note (Signed)
A1C today of 7.3, would like to see him <7. He agrees.  Increase Trulicity to 1.5 mg weekly. Continue Glipizide XL 10 mg daily, metformin 1000 mg BID.  Urine microalbumin due and pending.   Repeat A1C in 3 months. Follow up in 6 months.

## 2022-09-29 ENCOUNTER — Other Ambulatory Visit: Payer: Self-pay | Admitting: Primary Care

## 2022-09-29 DIAGNOSIS — N529 Male erectile dysfunction, unspecified: Secondary | ICD-10-CM

## 2022-10-03 ENCOUNTER — Other Ambulatory Visit: Payer: Self-pay | Admitting: Primary Care

## 2022-10-03 DIAGNOSIS — E1159 Type 2 diabetes mellitus with other circulatory complications: Secondary | ICD-10-CM

## 2022-10-10 DIAGNOSIS — E1159 Type 2 diabetes mellitus with other circulatory complications: Secondary | ICD-10-CM

## 2022-10-19 ENCOUNTER — Other Ambulatory Visit (HOSPITAL_COMMUNITY): Payer: Self-pay

## 2022-11-07 ENCOUNTER — Other Ambulatory Visit: Payer: Self-pay | Admitting: Primary Care

## 2022-11-07 DIAGNOSIS — E1159 Type 2 diabetes mellitus with other circulatory complications: Secondary | ICD-10-CM

## 2022-11-07 DIAGNOSIS — N529 Male erectile dysfunction, unspecified: Secondary | ICD-10-CM

## 2022-11-07 MED ORDER — TRULICITY 0.75 MG/0.5ML ~~LOC~~ SOAJ: 1.5000 mg | SUBCUTANEOUS | 0 refills | Status: DC

## 2022-11-26 ENCOUNTER — Other Ambulatory Visit: Payer: Self-pay | Admitting: Primary Care

## 2022-11-26 DIAGNOSIS — E1165 Type 2 diabetes mellitus with hyperglycemia: Secondary | ICD-10-CM

## 2022-11-27 ENCOUNTER — Other Ambulatory Visit: Payer: Self-pay | Admitting: Primary Care

## 2022-11-27 DIAGNOSIS — E1159 Type 2 diabetes mellitus with other circulatory complications: Secondary | ICD-10-CM

## 2022-12-04 ENCOUNTER — Other Ambulatory Visit: Payer: Self-pay | Admitting: Primary Care

## 2022-12-04 DIAGNOSIS — N529 Male erectile dysfunction, unspecified: Secondary | ICD-10-CM

## 2022-12-12 ENCOUNTER — Other Ambulatory Visit (INDEPENDENT_AMBULATORY_CARE_PROVIDER_SITE_OTHER): Payer: Managed Care, Other (non HMO)

## 2022-12-12 DIAGNOSIS — Z7985 Long-term (current) use of injectable non-insulin antidiabetic drugs: Secondary | ICD-10-CM | POA: Diagnosis not present

## 2022-12-12 DIAGNOSIS — E1159 Type 2 diabetes mellitus with other circulatory complications: Secondary | ICD-10-CM | POA: Diagnosis not present

## 2022-12-12 DIAGNOSIS — Z7984 Long term (current) use of oral hypoglycemic drugs: Secondary | ICD-10-CM

## 2022-12-12 LAB — POCT GLYCOSYLATED HEMOGLOBIN (HGB A1C)

## 2023-02-22 ENCOUNTER — Encounter (INDEPENDENT_AMBULATORY_CARE_PROVIDER_SITE_OTHER): Payer: Self-pay

## 2023-02-26 ENCOUNTER — Other Ambulatory Visit: Payer: Self-pay | Admitting: Primary Care

## 2023-02-26 ENCOUNTER — Other Ambulatory Visit: Payer: Self-pay | Admitting: Cardiovascular Disease

## 2023-02-26 DIAGNOSIS — E1165 Type 2 diabetes mellitus with hyperglycemia: Secondary | ICD-10-CM

## 2023-03-11 ENCOUNTER — Other Ambulatory Visit: Payer: Self-pay | Admitting: Cardiovascular Disease

## 2023-03-11 DIAGNOSIS — E785 Hyperlipidemia, unspecified: Secondary | ICD-10-CM

## 2023-03-14 ENCOUNTER — Encounter: Payer: Self-pay | Admitting: Primary Care

## 2023-03-14 ENCOUNTER — Ambulatory Visit (INDEPENDENT_AMBULATORY_CARE_PROVIDER_SITE_OTHER): Payer: Managed Care, Other (non HMO) | Admitting: Primary Care

## 2023-03-14 VITALS — BP 122/62 | HR 75 | Temp 97.3°F | Ht 72.0 in | Wt 215.0 lb

## 2023-03-14 DIAGNOSIS — E782 Mixed hyperlipidemia: Secondary | ICD-10-CM | POA: Diagnosis not present

## 2023-03-14 DIAGNOSIS — N529 Male erectile dysfunction, unspecified: Secondary | ICD-10-CM

## 2023-03-14 DIAGNOSIS — I5022 Chronic systolic (congestive) heart failure: Secondary | ICD-10-CM | POA: Diagnosis not present

## 2023-03-14 DIAGNOSIS — Z125 Encounter for screening for malignant neoplasm of prostate: Secondary | ICD-10-CM | POA: Diagnosis not present

## 2023-03-14 DIAGNOSIS — I7121 Aneurysm of the ascending aorta, without rupture: Secondary | ICD-10-CM | POA: Diagnosis not present

## 2023-03-14 DIAGNOSIS — Z7984 Long term (current) use of oral hypoglycemic drugs: Secondary | ICD-10-CM

## 2023-03-14 DIAGNOSIS — E1159 Type 2 diabetes mellitus with other circulatory complications: Secondary | ICD-10-CM

## 2023-03-14 DIAGNOSIS — Z0000 Encounter for general adult medical examination without abnormal findings: Secondary | ICD-10-CM | POA: Diagnosis not present

## 2023-03-14 DIAGNOSIS — I10 Essential (primary) hypertension: Secondary | ICD-10-CM

## 2023-03-14 DIAGNOSIS — Z7985 Long-term (current) use of injectable non-insulin antidiabetic drugs: Secondary | ICD-10-CM

## 2023-03-14 LAB — COMPREHENSIVE METABOLIC PANEL
ALT: 22 U/L (ref 0–53)
AST: 13 U/L (ref 0–37)
Albumin: 4.4 g/dL (ref 3.5–5.2)
Alkaline Phosphatase: 72 U/L (ref 39–117)
BUN: 20 mg/dL (ref 6–23)
CO2: 27 meq/L (ref 19–32)
Calcium: 10 mg/dL (ref 8.4–10.5)
Chloride: 99 meq/L (ref 96–112)
Creatinine, Ser: 1.03 mg/dL (ref 0.40–1.50)
GFR: 80.67 mL/min (ref >60.00)
Glucose, Bld: 157 mg/dL — ABNORMAL HIGH (ref 70–99)
Potassium: 3.9 meq/L (ref 3.5–5.1)
Sodium: 137 meq/L (ref 135–145)
Total Bilirubin: 0.8 mg/dL (ref 0.2–1.2)
Total Protein: 7.1 g/dL (ref 6.0–8.3)

## 2023-03-14 LAB — LIPID PANEL
Cholesterol: 120 mg/dL (ref 0–200)
HDL: 26.8 mg/dL — ABNORMAL LOW (ref >39.00)
LDL Cholesterol: 26 mg/dL (ref 0–99)
NonHDL: 92.7
Total CHOL/HDL Ratio: 4
Triglycerides: 334 mg/dL — ABNORMAL HIGH (ref 0.0–149.0)
VLDL: 66.8 mg/dL — ABNORMAL HIGH (ref 0.0–40.0)

## 2023-03-14 LAB — HEMOGLOBIN A1C: Hgb A1c MFr Bld: 7.5 % — ABNORMAL HIGH (ref 4.6–6.5)

## 2023-03-14 LAB — PSA: PSA: 0.33 ng/mL (ref 0.10–4.00)

## 2023-03-14 NOTE — Assessment & Plan Note (Signed)
Appears euvolemic today.  Following with cardiology.  Continue furosemide 40 mg daily, spironolactone 25 mg daily, carvedilol 6.25 mg BID, losartan 100 mg daily.  CMP pending.

## 2023-03-14 NOTE — Patient Instructions (Signed)
Stop by the lab prior to leaving today. I will notify you of your results once received.   Please schedule a follow up visit for 6 months for a diabetes check.  It was a pleasure to see you today!   

## 2023-03-14 NOTE — Assessment & Plan Note (Signed)
Controlled.  Continue Cialis 5 mg PRN.

## 2023-03-14 NOTE — Assessment & Plan Note (Signed)
Controlled.  Continue spirolactone 25 mg daily, losartan 100 mg daily, carvedilol 6.25 mg BID. CMP pending.

## 2023-03-14 NOTE — Assessment & Plan Note (Addendum)
Repeat A1C pending.  Discussed the importance of a healthy diet and regular exercise in order for weight loss, and to reduce the risk of further co-morbidity.  Foot exam today.  Continue Trulicity 1.5 mg weekly, Glipizide XL 10 mg daily, metformin 1000 mg BID. Follow up in 6 months.

## 2023-03-14 NOTE — Assessment & Plan Note (Signed)
Immunizations UTD. Declines influenza vaccine. Colonoscopy UTD, due 2027 PSA due and pending.  Discussed the importance of a healthy diet and regular exercise in order for weight loss, and to reduce the risk of further co-morbidity.  Exam stable. Labs pending.  Follow up in 1 year for repeat physical.

## 2023-03-14 NOTE — Progress Notes (Signed)
Subjective:    Patient ID: Dennis Curry, male    DOB: 1966-01-30, 57 y.o.   MRN: [id number]  HPI  Dennis Curry is a very pleasant 57 y.o. male who presents today for complete physical and follow up of chronic conditions.  Immunizations: -Tetanus: Completed in 2021 -Influenza: Declines  -Shingles: Completed Shingrix series -Pneumonia: Completed 2019  Diet: Fair diet.  Exercise: No regular exercise.  Eye exam: Completes annually Dental exam: Completes annually   Colonoscopy: Completed in 2017, due 2027  PSA: Due   BP Readings from Last 3 Encounters:  03/14/23 122/62  09/11/22 124/70  05/02/22 122/70        Review of Systems  Constitutional:  Negative for unexpected weight change.  HENT:  Negative for rhinorrhea.   Respiratory:  Negative for cough and shortness of breath.   Cardiovascular:  Negative for chest pain.  Gastrointestinal:  Negative for constipation and diarrhea.  Genitourinary:  Negative for difficulty urinating.  Musculoskeletal:  Negative for arthralgias and myalgias.  Skin:  Negative for rash.  Allergic/Immunologic: Negative for environmental allergies.  Neurological:  Negative for dizziness and headaches.  Psychiatric/Behavioral:  The patient is not nervous/anxious.          Past Medical History:  Diagnosis Date   Aortic insufficiency    a. TTE 11/30/14: EF 25-30%, bicupid valve cannot be excluded, mod-severe aortic regurge, aortic root dimension 52 mm, mild MR, mildly dilated LA/RA, PASP 60; TEE 12/01/14: EF 25-30%, diffuse HK, aortic valve trileaflet, mild to mod Ao regurge, ascending aortic grossly measured at 4.5 cm (not well visualized),    Ascending aorta dilatation (HCC)    a. 52 mm by TTE 11/2014; b. 45 mm by TEE 11/2014 (not well visualized)   Chronic systolic CHF (congestive heart failure) (HCC)    a. TTE 11/30/14: EF 25-30%, bicupid valve cannot be excluded, mod-severe aortic regurge, aortic root dimension 52 mm, mild MR, mildly  dilated LA/RA, PASP 60; TEE 12/01/14: EF 25-30%, diffuse HK, aortic valve trileaflet, mild to mod Ao regurge, ascending aortic grossly measured at 4.5 cm (not well visualized),    Diabetes mellitus without complication (HCC)    Hyperlipidemia    Hypertension    Influenza A 09/02/2018   Nonischemic dilated cardiomyopathy (HCC)    a. cath 12/02/14: right dom coronary system w/ mild lumenal irregs, o/w no sig CAD, sev diffuse HK EF 25%, no sig MR or AS, med Rx recommeded    Thyroid nodule 01/01/2019    Social History   Socioeconomic History   Marital status: Married    Spouse name: Not on file   Number of children: Not on file   Years of education: Not on file   Highest education level: Not on file  Occupational History   Not on file  Tobacco Use   Smoking status: Never   Smokeless tobacco: Never  Vaping Use   Vaping status: Never Used  Substance and Sexual Activity   Alcohol use: No    Alcohol/week: 0.0 standard drinks of alcohol   Drug use: No   Sexual activity: Not on file  Other Topics Concern   Not on file  Social History Narrative   Married.   Works as a Engineer, structural at [employer].   No children.   Enjoys playing guitar, traveling, spending time with his wife.   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     atorvastatin (LIPITOR) 40 MG tablet TAKE 1 TABLET BY MOUTH EVERYDAY AT BEDTIME 30 tablet 1   carvedilol (COREG) 6.25 MG tablet Take 1 tablet (6.25 mg total) by mouth 2 (two) times daily with a meal. 180 tablet 3   Dulaglutide (TRULICITY) 0.75 MG/0.5ML SOPN Inject 1.5 mg into the skin once a week. for diabetes. 12 mL 0   furosemide (LASIX) 40 MG tablet Take 1 tablet (40 mg total) by mouth daily. PLEASE CALL OFFICE TO SCHEDULE APPOINTMENT PRIOR TO NEXT REFILL 90 tablet 0   glipiZIDE (GLUCOTROL XL) 10 MG 24 hr tablet TAKE 1 TABLET BY MOUTH EVERY DAY WITH BREAKFAST FOR DIABETES 90 tablet 1   losartan (COZAAR) 100 MG tablet Take 1 tablet (100 mg total) by mouth daily. PLEASE CALL OFFICE TO SCHEDULE APPOINTMENT PRIOR TO NEXT REFILL 90 tablet 0   metFORMIN (GLUCOPHAGE) 1000 MG tablet TAKE 1 TABLET (1,000 MG TOTAL) BY MOUTH 2 (TWO) TIMES DAILY WITH A MEAL. FOR DIABETES. 180 tablet 1   spironolactone (ALDACTONE) 25 MG tablet Take 1 tablet (25 mg total) by mouth 2 (two) times daily. 180 tablet 3   tadalafil (CIALIS) 5 MG tablet TAKE 1 TABLET BY MOUTH EVERY 3 DAYS AS NEEDED FOR ERECTILE DYSFUNCTION. 90 tablet 0   Current Facility-Administered Medications on File Prior to Visit  Medication Dose Route Frequency Provider Last Rate Last Admin   acetaminophen (TYLENOL) tablet 650 mg  650 mg Oral Q4H PRN Gollan, Tollie Pizza, MD        BP 122/62   Pulse 75   Temp (!) 97.3 F (36.3 C) (Temporal)   Ht 6' (1.829 m)   Wt 215 lb (97.5 kg)   SpO2 98%   BMI 29.16 kg/m  Objective:   Physical Exam HENT:     Right Ear: Tympanic membrane and ear canal normal.     Left Ear: Tympanic membrane and ear canal normal.     Nose: Nose normal.      Right Sinus: No maxillary sinus tenderness or frontal sinus tenderness.     Left Sinus: No maxillary sinus tenderness or frontal sinus tenderness.  Eyes:     Conjunctiva/sclera: Conjunctivae normal.  Neck:     Thyroid: No thyromegaly.     Vascular: No carotid bruit.  Cardiovascular:     Rate and Rhythm: Normal rate and regular rhythm.     Heart sounds: Normal heart sounds.  Pulmonary:     Effort: Pulmonary effort is normal.     Breath sounds: Normal  breath sounds. No wheezing or rales.  Abdominal:     General: Bowel sounds are normal.     Palpations: Abdomen is soft.     Tenderness: There is no abdominal tenderness.  Musculoskeletal:        General: Normal range of motion.     Cervical back: Neck supple.  Skin:    General: Skin is warm and dry.  Neurological:     Mental Status: He is alert and oriented to person, place, and time.     Cranial Nerves: No cranial nerve deficit.     Deep Tendon Reflexes: Reflexes are normal and symmetric.  Psychiatric:        Mood and Affect: Mood normal.           Assessment & Plan:  Preventative health care Assessment & Plan: Immunizations UTD. Declines influenza vaccine. Colonoscopy UTD, due 2027 PSA due and pending.  Discussed the importance of a healthy diet and regular exercise in order for weight loss, and to reduce the risk of further co-morbidity.  Exam stable. Labs pending.  Follow up in 1 year for repeat physical.    Aneurysm of ascending aorta without rupture [facility]) Assessment & Plan: Following with cardiology, echocardiogram pending for 2024. Reviewed cardiology notes from October 2023.   Chronic systolic heart failure (HCC) Assessment & Plan: Appears euvolemic today.  Following with cardiology.  Continue furosemide 40 mg daily, spironolactone 25 mg daily, carvedilol 6.25 mg BID, losartan 100 mg daily.  CMP pending.   Essential hypertension Assessment & Plan: Controlled.  Continue spirolactone 25 mg  daily, losartan 100 mg daily, carvedilol 6.25 mg BID. CMP pending.  Orders: -     Comprehensive metabolic panel  Type 2 diabetes mellitus with other circulatory complication, without long-term current use of insulin (HCC) Assessment & Plan: Repeat A1C pending.  Discussed the importance of a healthy diet and regular exercise in order for weight loss, and to reduce the risk of further co-morbidity.  Foot exam today.  Continue Trulicity 1.5 mg weekly, Glipizide XL 10 mg daily, metformin 1000 mg BID. Follow up in 6 months.  Orders: -     Hemoglobin A1c  Erectile dysfunction, unspecified erectile dysfunction type Assessment & Plan: Controlled.   Continue Cialis 5 mg PRN.   Mixed hyperlipidemia Assessment & Plan: Repeat lipid panel pending.  Discussed the importance of a healthy diet and regular exercise in order for weight loss, and to reduce the risk of further co-morbidity. Continue atorvastatin 40 mg daily  Orders: -     Lipid panel  Screening for prostate cancer -     PSA        [name], NP

## 2023-03-14 NOTE — Assessment & Plan Note (Signed)
Following with cardiology, echocardiogram pending for 2024. Reviewed cardiology notes from October 2023.

## 2023-03-14 NOTE — Assessment & Plan Note (Signed)
Repeat lipid panel pending.  Discussed the importance of a healthy diet and regular exercise in order for weight loss, and to reduce the risk of further co-morbidity. Continue atorvastatin 40 mg daily.

## 2023-04-12 LAB — HM DIABETES EYE EXAM

## 2023-04-18 ENCOUNTER — Other Ambulatory Visit: Payer: Self-pay | Admitting: Cardiovascular Disease

## 2023-04-18 DIAGNOSIS — E1159 Type 2 diabetes mellitus with other circulatory complications: Secondary | ICD-10-CM

## 2023-04-18 DIAGNOSIS — I5022 Chronic systolic (congestive) heart failure: Secondary | ICD-10-CM

## 2023-04-18 DIAGNOSIS — I7121 Aneurysm of the ascending aorta, without rupture: Secondary | ICD-10-CM

## 2023-04-18 DIAGNOSIS — I77810 Thoracic aortic ectasia: Secondary | ICD-10-CM

## 2023-04-18 DIAGNOSIS — E782 Mixed hyperlipidemia: Secondary | ICD-10-CM

## 2023-04-18 DIAGNOSIS — I10 Essential (primary) hypertension: Secondary | ICD-10-CM

## 2023-04-18 DIAGNOSIS — I420 Dilated cardiomyopathy: Secondary | ICD-10-CM

## 2023-04-18 DIAGNOSIS — I359 Nonrheumatic aortic valve disorder, unspecified: Secondary | ICD-10-CM

## 2023-04-18 DIAGNOSIS — E785 Hyperlipidemia, unspecified: Secondary | ICD-10-CM

## 2023-04-23 ENCOUNTER — Other Ambulatory Visit: Payer: Self-pay | Admitting: Primary Care

## 2023-04-23 DIAGNOSIS — E1159 Type 2 diabetes mellitus with other circulatory complications: Secondary | ICD-10-CM

## 2023-04-23 DIAGNOSIS — I7121 Aneurysm of the ascending aorta, without rupture: Secondary | ICD-10-CM

## 2023-04-24 MED ORDER — TRULICITY 1.5 MG/0.5ML ~~LOC~~ SOAJ: 1.5000 mg | SUBCUTANEOUS | 1 refills | Status: DC

## 2023-04-24 NOTE — Telephone Encounter (Signed)
Please call patient:  Received refill request for Trulicity 1.[facility] mg dose from [facility].  Is he still using two of the 0.75 mg Trulicity pens at a time? What is his preferred pharmacy? [facility] or [facility]?

## 2023-04-24 NOTE — Telephone Encounter (Signed)
Called and spoke with patient, he stated he has been using the 1.5mg  dose pens since the last time it was refilled in August. His preferred pharmacy is [facility], however last time they did not have this in stock and that is why it was sent to [facility]. He would like it sent back to [facility] this time.

## 2023-04-24 NOTE — Telephone Encounter (Signed)
Noted, Rx sent to pharmacy. 

## 2023-05-03 ENCOUNTER — Ambulatory Visit: Payer: Managed Care, Other (non HMO) | Attending: Cardiovascular Disease

## 2023-05-03 DIAGNOSIS — I5022 Chronic systolic (congestive) heart failure: Secondary | ICD-10-CM | POA: Diagnosis not present

## 2023-05-03 DIAGNOSIS — I77810 Thoracic aortic ectasia: Secondary | ICD-10-CM

## 2023-05-03 DIAGNOSIS — I7121 Aneurysm of the ascending aorta, without rupture: Secondary | ICD-10-CM

## 2023-05-03 DIAGNOSIS — I420 Dilated cardiomyopathy: Secondary | ICD-10-CM

## 2023-05-03 DIAGNOSIS — I359 Nonrheumatic aortic valve disorder, unspecified: Secondary | ICD-10-CM

## 2023-05-03 MED ORDER — PERFLUTREN LIPID MICROSPHERE
1.0000 mL | INTRAVENOUS | Status: AC | PRN
Start: 2023-05-03 — End: 2023-05-03
  Administered 2023-05-03: 2 mL via INTRAVENOUS

## 2023-05-04 LAB — ECHOCARDIOGRAM COMPLETE
AR max vel: 3.57 cm2
AV Area VTI: 3.34 cm2
AV Area mean vel: 3.47 cm2
AV Mean grad: 3 mm[Hg]
AV Peak grad: 5.7 mm[Hg]
AV Vena cont: 0.5 cm
Ao pk vel: 1.19 m/s
Area-P 1/2: 4.6 cm2
P 1/2 time: 630 ms
S' Lateral: 3.5 cm

## 2023-05-08 NOTE — Telephone Encounter (Signed)
Called patient and notified him of the following from Dr. Mariah Milling.  Echo  Low normal ejection fraction 50 to 55%  Normal RV function  Mild to moderate aortic valve regurgitation  Aorta size is increased compared to prior study, now 52 mm, previously estimated 47 mm   Would recommend a CT angio chest aorta with and without contrast to confirm aorta size  CT done October 2019, was 45 mm at that time   Patient verbalizes understanding. Order placed for CT.

## 2023-05-10 ENCOUNTER — Other Ambulatory Visit: Payer: Self-pay | Admitting: Cardiovascular Disease

## 2023-05-10 ENCOUNTER — Other Ambulatory Visit: Payer: Self-pay | Admitting: Primary Care

## 2023-05-10 DIAGNOSIS — E1159 Type 2 diabetes mellitus with other circulatory complications: Secondary | ICD-10-CM

## 2023-05-10 DIAGNOSIS — I10 Essential (primary) hypertension: Secondary | ICD-10-CM

## 2023-05-17 ENCOUNTER — Other Ambulatory Visit: Payer: Self-pay | Admitting: Emergency Medicine

## 2023-05-17 DIAGNOSIS — I7121 Aneurysm of the ascending aorta, without rupture: Secondary | ICD-10-CM

## 2023-05-26 ENCOUNTER — Other Ambulatory Visit: Payer: Self-pay | Admitting: Primary Care

## 2023-05-26 DIAGNOSIS — E1165 Type 2 diabetes mellitus with hyperglycemia: Secondary | ICD-10-CM

## 2023-06-01 ENCOUNTER — Ambulatory Visit
Admission: RE | Admit: 2023-06-01 | Discharge: 2023-06-01 | Disposition: A | Discharge status: Home / Self Care | Payer: Managed Care, Other (non HMO) | Source: Ambulatory Visit | Attending: Cardiovascular Disease | Admitting: Cardiovascular Disease

## 2023-06-01 DIAGNOSIS — I7121 Aneurysm of the ascending aorta, without rupture: Secondary | ICD-10-CM | POA: Diagnosis present

## 2023-06-01 LAB — POCT I-STAT CREATININE: Creatinine, Ser: 1 mg/dL (ref 0.61–1.24)

## 2023-06-01 MED ORDER — IOHEXOL 350 MG/ML SOLN
75.0000 mL | Freq: Once | INTRAVENOUS | Status: AC | PRN
  Administered 2023-06-01: 75 mL via INTRAVENOUS

## 2023-06-17 NOTE — Progress Notes (Unsigned)
Cardiology Office Note  Date:  06/18/2023   ID:  Dennis Curry, DOB 1966/02/23, MRN [id number]  PCP:  [name], NP   Chief Complaint  Patient presents with   12 month follow up     "Doing well." Follow up CT scan. Medications reviewed by the patient verbally.     HPI:  57 year old gentleman with PMH of DM II Nonischemic cardiomyopathy EF 25% in 2016 , up to 45 to 50% in 2016, 50 to 55%  In 2017 No CAD on cardiac cath, 2016 Aortic root 47 mm, ascending aorta 45 mm, unchanged dating back to 2016 Aortic valve regurgitation is moderate. , trileaflet aortic valve who presents for routine follow-up of his systolic CHF and dilated ascending aorta  Last seen in clinic by myself October 2023  In follow-up today reports that he feels well Denies chest pain or shortness of breath concerning for angina  CT scan November 2024 Ascending aorta 4.4 cm, stable compared to 4.5 in 2019 Sinus of Valsalva estimated 4.9 cm, increased from 4.7-4.8 in 2019  Blood pressure well-controlled Active at home, lots of activities, building a add-on to his garage  Deniesno PND orthopnea  Echo ejection fraction 50 to 55%  A1c in the 7 range Cholesterol at goal  EKG personally reviewed by myself on todays visit EKG Interpretation Date/Time:  Monday June 18 2023 09:57:44 EST Ventricular Rate:  74 PR Interval:  194 QRS Duration:  154 QT Interval:  438 QTC Calculation: 486 R Axis:   -67  Text Interpretation: Normal sinus rhythm Right bundle branch block Left anterior fascicular block Bifascicular block Minimal voltage criteria for LVH, may be normal variant ( R in aVL ) When compared with ECG of 30-Nov-2014 01:17, (RBBB and left anterior fascicular block) is now Present Confirmed by Julien Nordmann [phone]) on 06/18/2023 10:19:53 AM    Other past medical history reviewed  CT chest: October 2019 1. Stable aneurysmal disease of the thoracic aorta with aortic root diameter of  approximately 4.7-4.8 cm at the sinuses of Valsalva and maximal ascending thoracic aorta diameter of 4.5 cm. 2. Coronary atherosclerosis with calcified plaque in the distribution of the LAD and RCA. 3. Aneurysmal disease of the ascending thoracic aorta does exert some mass effect on the SVC causing some apparent narrowing of the distal SVC by CT.   TEE 2015 4.5 aorta ascending Aorta Root 4.4  Echo TTE 2016  Ascending  aorta 4.5  Initially developed progressive chest congestion initially felt to be allergies, treated with several rounds of ABX,  presenting to the hospital with worsening shortness of breath found to have acute systolic CHF, EF 82%, dilated ascending aorta, cardiac catheterization showing no significant CAD, aorta 4.5 cm on TEE,  PMH:   has a past medical history of Aortic insufficiency, Ascending aorta dilatation (HCC), Chronic systolic CHF (congestive heart failure) (HCC), Diabetes mellitus without complication (HCC), Hyperlipidemia, Hypertension, Influenza A (09/02/2018), Nonischemic dilated cardiomyopathy (HCC), and Thyroid nodule (01/01/2019).  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     atorvastatin (LIPITOR) 40 MG tablet TAKE 1 TABLET BY MOUTH EVERYDAY AT BEDTIME 30 tablet 1   carvedilol (COREG) 6.25 MG tablet TAKE 1 TABLET BY MOUTH 2 TIMES DAILY WITH A MEAL. 180 tablet 3   Dulaglutide (TRULICITY) 1.5 MG/0.5ML SOPN Inject 1.5 mg into the skin once a week. for diabetes. 6 mL 1   furosemide (LASIX) 40 MG  tablet Take 1 tablet (40 mg total) by mouth daily. PLEASE CALL OFFICE TO SCHEDULE APPOINTMENT PRIOR TO NEXT REFILL 90 tablet 0   glipiZIDE (GLUCOTROL XL) 10 MG 24 hr tablet TAKE 1 TABLET BY MOUTH EVERY DAY WITH BREAKFAST FOR DIABETES 90 tablet 1   losartan (COZAAR) 100 MG tablet Take 1 tablet (100 mg total) by mouth daily. PLEASE CALL OFFICE TO SCHEDULE APPOINTMENT PRIOR TO NEXT REFILL 90 tablet 0   metFORMIN (GLUCOPHAGE) 1000 MG tablet TAKE 1 TABLET (1,000 MG TOTAL) BY MOUTH 2 (TWO) TIMES DAILY WITH A MEAL. FOR DIABETES. 180 tablet 1   spironolactone (ALDACTONE) 25 MG tablet TAKE 1 TABLET BY MOUTH TWICE A DAY 180 tablet 3   tadalafil (CIALIS) 5 MG tablet TAKE 1 TABLET BY MOUTH EVERY 3 DAYS AS NEEDED FOR ERECTILE DYSFUNCTION. 90 tablet 0   No current facility-administered medications for this visit.   Facility-Administered Medications Ordered in Other Visits  Medication Dose Route Frequency Provider Last Rate Last Admin   acetaminophen (TYLENOL) tablet 650 mg  650 mg Oral Q4H PRN Hatem Cull, Tollie Pizza, MD         Allergies:   Patient has no known allergies.   Social History:  The patient  reports that he has never smoked. He has never used smokeless tobacco. He reports that he does not drink alcohol and does not use drugs.   Family History:   family history includes Coronary artery disease in his mother; Diabetes Mellitus II in his paternal uncle; Hypertension in his father.    Review of Systems: Review of Systems  Constitutional: Negative.   Respiratory: Negative.    Cardiovascular: Negative.   Gastrointestinal: Negative.   Musculoskeletal: Negative.   Neurological: Negative.   Psychiatric/Behavioral: Negative.    All other systems reviewed and are negative.    PHYSICAL EXAM: VS:  BP 122/64 (BP Location: Left Arm, Patient Position: Sitting, Cuff Size: Normal)   Pulse 74   Ht 6' (1.829 m)   Wt 215 lb (97.5 kg)   SpO2 98%   BMI 29.16 kg/m  , BMI Body mass index is 29.16  kg/m. Constitutional:  oriented to person, place, and time. No distress.  HENT:  Head: Grossly normal Eyes:  no discharge. No scleral icterus.  Neck: No JVD, no carotid bruits  Cardiovascular: Regular rate and rhythm, no murmurs appreciated Pulmonary/Chest: Clear to auscultation bilaterally, no wheezes or rails Abdominal: Soft.  no distension.  no tenderness.  Musculoskeletal: Normal range of motion Neurological:  normal muscle tone. Coordination normal. No atrophy Skin: Skin warm and dry Psychiatric: normal affect, pleasant  Recent Labs: 03/14/2023: ALT 22; BUN 20; Potassium 3.9; Sodium 137 06/01/2023: Creatinine, Ser 1.00    Lipid Panel Lab Results  Component Value Date   CHOL 120 03/14/2023   HDL 26.80 (L) 03/14/2023   LDLCALC 26 03/14/2023   TRIG 334.0 (H) 03/14/2023      Wt Readings from Last 3 Encounters:  06/18/23 215 lb (97.5 kg)  03/14/23 215 lb (97.5 kg)  09/11/22 217 lb (98.4 kg)  ASSESSMENT AND PLAN:  Ascending aorta dilation (HCC) - CT scan 2017 and 2019 and 2024 Stable on CT scan  systolic CHF (congestive heart failure) (HCC) -   ejection fraction 50-55%, unchanged on echo Remains on Lasix 40 daily Blood pressure well-controlled, euvolemic  Aortic valve disorder -  trileaflet on transesophageal echo Mild to moderate aortic valve regurgitation, asymptomatic  Essential hypertension -  Blood pressure is well controlled on today's visit. No changes made to the medications.  Nonischemic dilated cardiomyopathy (HCC) -  Stable ejection fraction 50%-55% We have previously discussed transitioning losartan to Entresto and the addition of Jardiance/Farxiga Stable, no changes made  Aneurysm, ascending aorta (HCC) Details discussed from CT scan 2024 in comparison to 2019, no significant change   Orders Placed This Encounter  Procedures   EKG 12-Lead     Signed, Dossie Arbour, M.D., Ph.D. 06/18/2023  [facility] [place],  [place] [phone]

## 2023-06-18 ENCOUNTER — Encounter: Payer: Self-pay | Admitting: Cardiovascular Disease

## 2023-06-18 ENCOUNTER — Ambulatory Visit: Payer: Managed Care, Other (non HMO) | Attending: Cardiovascular Disease | Admitting: Cardiovascular Disease

## 2023-06-18 VITALS — BP 122/64 | HR 74 | Ht 72.0 in | Wt 215.0 lb

## 2023-06-18 DIAGNOSIS — I420 Dilated cardiomyopathy: Secondary | ICD-10-CM | POA: Diagnosis not present

## 2023-06-18 DIAGNOSIS — E785 Hyperlipidemia, unspecified: Secondary | ICD-10-CM

## 2023-06-18 DIAGNOSIS — E1159 Type 2 diabetes mellitus with other circulatory complications: Secondary | ICD-10-CM | POA: Diagnosis not present

## 2023-06-18 DIAGNOSIS — E782 Mixed hyperlipidemia: Secondary | ICD-10-CM

## 2023-06-18 DIAGNOSIS — I10 Essential (primary) hypertension: Secondary | ICD-10-CM

## 2023-06-18 DIAGNOSIS — I77810 Thoracic aortic ectasia: Secondary | ICD-10-CM | POA: Diagnosis not present

## 2023-06-18 DIAGNOSIS — I359 Nonrheumatic aortic valve disorder, unspecified: Secondary | ICD-10-CM

## 2023-06-18 DIAGNOSIS — I5022 Chronic systolic (congestive) heart failure: Secondary | ICD-10-CM

## 2023-06-18 DIAGNOSIS — I7121 Aneurysm of the ascending aorta, without rupture: Secondary | ICD-10-CM

## 2023-06-18 MED ORDER — CARVEDILOL 6.25 MG PO TABS: 6.2500 mg | ORAL_TABLET | Freq: Two times a day (BID) | ORAL | 3 refills | Status: DC

## 2023-06-18 MED ORDER — FUROSEMIDE 40 MG PO TABS: 40.0000 mg | ORAL_TABLET | Freq: Every day | ORAL | 3 refills | Status: DC

## 2023-06-18 MED ORDER — LOSARTAN POTASSIUM 100 MG PO TABS: 100.0000 mg | ORAL_TABLET | Freq: Every day | ORAL | 3 refills | Status: DC

## 2023-06-18 MED ORDER — ATORVASTATIN CALCIUM 40 MG PO TABS: ORAL_TABLET | ORAL | 3 refills | Status: DC

## 2023-06-18 MED ORDER — SPIRONOLACTONE 25 MG PO TABS: 25.0000 mg | ORAL_TABLET | Freq: Two times a day (BID) | ORAL | 3 refills | Status: DC

## 2023-06-18 NOTE — Patient Instructions (Signed)

## 2023-09-11 ENCOUNTER — Ambulatory Visit: Payer: Managed Care, Other (non HMO) | Admitting: Primary Care

## 2023-09-11 ENCOUNTER — Encounter: Payer: Self-pay | Admitting: Primary Care

## 2023-09-11 VITALS — BP 136/68 | HR 72 | Temp 97.5°F | Ht 72.0 in | Wt 216.0 lb

## 2023-09-11 DIAGNOSIS — E1159 Type 2 diabetes mellitus with other circulatory complications: Secondary | ICD-10-CM

## 2023-09-11 DIAGNOSIS — Z7985 Long-term (current) use of injectable non-insulin antidiabetic drugs: Secondary | ICD-10-CM

## 2023-09-11 DIAGNOSIS — Z7984 Long term (current) use of oral hypoglycemic drugs: Secondary | ICD-10-CM

## 2023-09-11 LAB — POCT GLYCOSYLATED HEMOGLOBIN (HGB A1C): Hemoglobin A1C: 7.4 % — AB (ref 4.0–5.6)

## 2023-09-11 LAB — MICROALBUMIN / CREATININE URINE RATIO
Creatinine,U: 21.9 mg/dL
Microalb Creat Ratio: 32 mg/g — ABNORMAL HIGH (ref 0.0–30.0)
Microalb, Ur: <0.7 mg/dL (ref 0.0–1.9)

## 2023-09-11 NOTE — Patient Instructions (Signed)
 It is important that you improve your diet. Please limit carbohydrates in the form of white bread, rice, pasta, sweets, fast food, fried food, sugary drinks, etc. Increase your consumption of fresh fruits and vegetables, whole grains, lean protein.  Ensure you are consuming 64 ounces of water daily.  Start exercising. You should be getting 150 minutes of moderate intensity exercise weekly.  Please schedule a physical to meet with me in 6 months.   It was a pleasure to see you today!

## 2023-09-11 NOTE — Progress Notes (Signed)
 Subjective:    Patient ID: Dennis Curry, male    DOB: 1966/04/06, 58 y.o.   MRN: [id number]  HPI  Dennis Curry is a very pleasant 58 y.o. male with a history of type 2 diabetes, CHF, hyperlipidemia, nonischemic dilated cardiomyopathy who presents today for follow-up diabetes.   Current medications include: Trulicity 1.5 mg weekly, Glipizide XL 10 mg daily, metformin 1000 mg BID.   He is checking his blood glucose continuously by his smart watch. He is seeing readings ranging from 89-150s.   He denies hypoglycemic episodes.   Last A1C: 7.5 in September 2024, 7.4 today Last Eye Exam: UTD Last Foot Exam: UTD Pneumonia Vaccination: 2019 Urine Microalbumin: Due Statin: atoravstatin   Dietary changes since last visit: He has recently increased protein intake, trying to stay away from starchy foods. [name] sweets.    Exercise: No regular exercise, less active during winter months. Active at work.   BP Readings from Last 3 Encounters:  09/11/23 136/68  06/18/23 122/64  03/14/23 122/62      Review of Systems  Eyes:  Negative for visual disturbance.  Respiratory:  Negative for shortness of breath.   Gastrointestinal:  Negative for abdominal pain and constipation.  Neurological:  Negative for dizziness and numbness.         Past Medical History:  Diagnosis Date   Aortic insufficiency    a. TTE 11/30/14: EF 25-30%, bicupid valve cannot be excluded, mod-severe aortic regurge, aortic root dimension 52 mm, mild MR, mildly dilated LA/RA, PASP 60; TEE 12/01/14: EF 25-30%, diffuse HK, aortic valve trileaflet, mild to mod Ao regurge, ascending aortic grossly measured at 4.5 cm (not well visualized),    Ascending aorta dilatation (HCC)    a. 52 mm by TTE 11/2014; b. 45 mm by TEE 11/2014 (not well visualized)   Chronic systolic CHF (congestive heart failure) (HCC)    a. TTE 11/30/14: EF 25-30%, bicupid valve cannot be excluded, mod-severe aortic regurge, aortic root dimension 52 mm,  mild MR, mildly dilated LA/RA, PASP 60; TEE 12/01/14: EF 25-30%, diffuse HK, aortic valve trileaflet, mild to mod Ao regurge, ascending aortic grossly measured at 4.5 cm (not well visualized),    Diabetes mellitus without complication (HCC)    Hyperlipidemia    Hypertension    Influenza A 09/02/2018   Nonischemic dilated cardiomyopathy (HCC)    a. cath 12/02/14: right dom coronary system w/ mild lumenal irregs, o/w no sig CAD, sev diffuse HK EF 25%, no sig MR or AS, med Rx recommeded    Thyroid nodule 01/01/2019    Social History   Socioeconomic History   Marital status: Married    Spouse name: Not on file   Number of children: Not on file   Years of education: Not on file   Highest education level: Associate degree: occupational, Scientist, product/process development, or vocational program  Occupational History   Not on file  Tobacco Use   Smoking status: Never   Smokeless tobacco: Never  Vaping Use   Vaping status: Never Used  Substance and Sexual Activity   Alcohol use: No    Alcohol/week: 0.0 standard drinks of alcohol   Drug use: No   Sexual activity: Not on file  Other Topics Concern   Not on file  Social History Narrative   Married.   Works as a Engineer, structural at [employer].   No children.   Enjoys playing guitar, traveling, spending time with his wife.   Social Drivers of Corporate investment banker Strain:  Low Risk  (09/05/2023)   Overall Financial Resource Strain (CARDIA)    Difficulty of Paying Living Expenses: Not hard at all  Food Insecurity: No Food Insecurity (09/05/2023)   Hunger Vital Sign    Worried About Running Out of Food in the Last Year: Never true    Ran Out of Food in the Last Year: Never true  Transportation Needs: No Transportation Needs (09/05/2023)   PRAPARE - Administrator, Civil Service (Medical): No    Lack of Transportation (Non-Medical): No  Physical Activity: Insufficiently Active (09/05/2023)   Exercise Vital Sign    Days of Exercise per Week: 3 days     Minutes of Exercise per Session: 30 min  Stress: No Stress Concern Present (09/05/2023)   [employer] of Occupational Health - Occupational Stress Questionnaire    Feeling of Stress : Not at all  Social Connections: Unknown (09/05/2023)   Social Connection and Isolation Panel [NHANES]    Frequency of Communication with Friends and Family: More than three times a week    Frequency of Social Gatherings with Friends and Family: Once a week    Attends Religious Services: Patient declined    Database administrator or Organizations: No    Attends Engineer, structural: Not on file    Marital Status: Married  Catering manager Violence: Not on file    Past Surgical History:  Procedure Laterality Date   CARDIAC CATHETERIZATION N/A 12/02/2014   Procedure: Left Heart Cath and Coronary Angiography;  Surgeon: Antonieta Iba, MD;  Location: [facility] INVASIVE CV LAB;  Service: Cardiovascular;  Laterality: N/A;   COLONOSCOPY  1992   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     atorvastatin (LIPITOR) 40 MG tablet TAKE 1 TABLET BY MOUTH EVERYDAY AT BEDTIME 90 tablet 3   carvedilol (COREG) 6.25 MG tablet Take 1 tablet (6.25 mg total) by mouth 2 (two) times daily with a meal. 180 tablet 3   Dulaglutide (TRULICITY) 1.5 MG/0.5ML SOPN Inject 1.5 mg into the skin once a week. for diabetes. 6 mL 1   furosemide (LASIX) 40 MG tablet Take 1 tablet (40 mg total) by mouth daily. 90 tablet 3   glipiZIDE (GLUCOTROL XL) 10 MG 24 hr tablet TAKE 1 TABLET BY MOUTH EVERY DAY WITH BREAKFAST FOR DIABETES 90 tablet 1    losartan (COZAAR) 100 MG tablet Take 1 tablet (100 mg total) by mouth daily. 90 tablet 3   metFORMIN (GLUCOPHAGE) 1000 MG tablet TAKE 1 TABLET (1,000 MG TOTAL) BY MOUTH 2 (TWO) TIMES DAILY WITH A MEAL. FOR DIABETES. 180 tablet 1   spironolactone (ALDACTONE) 25 MG tablet Take 1 tablet (25 mg total) by mouth 2 (two) times daily. 180 tablet 3   tadalafil (CIALIS) 5 MG tablet TAKE 1 TABLET BY MOUTH EVERY 3 DAYS AS NEEDED FOR ERECTILE DYSFUNCTION. 90 tablet 0   Current Facility-Administered Medications on File Prior to Visit  Medication Dose Route Frequency Provider Last Rate Last Admin  acetaminophen (TYLENOL) tablet 650 mg  650 mg Oral Q4H PRN Gollan, Tollie Pizza, MD        BP 136/68   Pulse 72   Temp (!) 97.5 F (36.4 C) (Temporal)   Ht 6' (1.829 m)   Wt 216 lb (98 kg)   SpO2 98%   BMI 29.29 kg/m  Objective:   Physical Exam Cardiovascular:     Rate and Rhythm: Normal rate and regular rhythm.  Pulmonary:     Effort: Pulmonary effort is normal.     Breath sounds: Normal breath sounds.  Musculoskeletal:     Cervical back: Neck supple.  Skin:    General: Skin is warm and dry.  Neurological:     Mental Status: He is alert and oriented to person, place, and time.  Psychiatric:        Mood and Affect: Mood normal.           Assessment & Plan:  Type 2 diabetes mellitus with other circulatory complication, without long-term current use of insulin (HCC) Assessment & Plan: Slight improvement but still above goal with A1C of 7.4. would like to see him <7.0. Discussed this with patient.  Recommended to increase Trulicity to 3 mg weekly. He declines as he wants to work on lifestyle changes.   Continue Trulicity 1.5 mg weekly, glipizide XL 10 mg weekly, metformin 1000 mg BID.  Urine microalbumin pending. Follow up in 6 months.    Orders: -     POCT glycosylated hemoglobin (Hb A1C) -     Microalbumin / creatinine urine ratio        [name], NP

## 2023-09-11 NOTE — Assessment & Plan Note (Signed)
 Slight improvement but still above goal with A1C of 7.4. would like to see him <7.0. Discussed this with patient.  Recommended to increase Trulicity to 3 mg weekly. He declines as he wants to work on lifestyle changes.   Continue Trulicity 1.5 mg weekly, glipizide XL 10 mg weekly, metformin 1000 mg BID.  Urine microalbumin pending. Follow up in 6 months.

## 2023-09-16 ENCOUNTER — Other Ambulatory Visit: Payer: Self-pay | Admitting: Primary Care

## 2023-09-16 DIAGNOSIS — N529 Male erectile dysfunction, unspecified: Secondary | ICD-10-CM

## 2023-09-28 ENCOUNTER — Other Ambulatory Visit: Payer: Self-pay | Admitting: Primary Care

## 2023-09-28 DIAGNOSIS — E1165 Type 2 diabetes mellitus with hyperglycemia: Secondary | ICD-10-CM

## 2023-10-07 ENCOUNTER — Other Ambulatory Visit: Payer: Self-pay | Admitting: Primary Care

## 2023-10-07 DIAGNOSIS — E1159 Type 2 diabetes mellitus with other circulatory complications: Secondary | ICD-10-CM

## 2023-10-16 ENCOUNTER — Telehealth: Payer: Self-pay

## 2023-10-16 ENCOUNTER — Other Ambulatory Visit (HOSPITAL_COMMUNITY): Payer: Self-pay

## 2023-10-16 NOTE — Telephone Encounter (Signed)
*  Primary  Pharmacy Patient Advocate Encounter  Received notification from [facility] that Prior Authorization for Trulicity 1.5MG /0.5ML auto-injectors  has been APPROVED from 10/16/2023 to 10/15/2024. Ran test claim, Copay is $25.00. This test claim was processed through [facility]- copay amounts may vary at other pharmacies due to pharmacy/plan contracts, or as the patient moves through the different stages of their insurance plan.   PA #/Case ID/Reference #: [id number]

## 2023-10-24 ENCOUNTER — Other Ambulatory Visit (HOSPITAL_COMMUNITY): Payer: Self-pay

## 2023-10-29 ENCOUNTER — Other Ambulatory Visit: Payer: Self-pay | Admitting: Primary Care

## 2023-10-29 DIAGNOSIS — E1159 Type 2 diabetes mellitus with other circulatory complications: Secondary | ICD-10-CM

## 2024-02-16 ENCOUNTER — Other Ambulatory Visit: Payer: Self-pay | Admitting: Primary Care

## 2024-02-16 DIAGNOSIS — E1159 Type 2 diabetes mellitus with other circulatory complications: Secondary | ICD-10-CM

## 2024-02-17 ENCOUNTER — Other Ambulatory Visit: Payer: Self-pay | Admitting: Primary Care

## 2024-02-17 DIAGNOSIS — E1165 Type 2 diabetes mellitus with hyperglycemia: Secondary | ICD-10-CM

## 2024-03-14 ENCOUNTER — Ambulatory Visit: Payer: Self-pay | Admitting: Primary Care

## 2024-03-14 ENCOUNTER — Ambulatory Visit: Admitting: Primary Care

## 2024-03-14 ENCOUNTER — Encounter: Payer: Self-pay | Admitting: Primary Care

## 2024-03-14 VITALS — BP 134/68 | HR 74 | Temp 98.3°F | Ht 72.0 in | Wt 207.0 lb

## 2024-03-14 DIAGNOSIS — E1159 Type 2 diabetes mellitus with other circulatory complications: Secondary | ICD-10-CM

## 2024-03-14 DIAGNOSIS — E782 Mixed hyperlipidemia: Secondary | ICD-10-CM

## 2024-03-14 DIAGNOSIS — I5022 Chronic systolic (congestive) heart failure: Secondary | ICD-10-CM

## 2024-03-14 DIAGNOSIS — Z125 Encounter for screening for malignant neoplasm of prostate: Secondary | ICD-10-CM | POA: Diagnosis not present

## 2024-03-14 DIAGNOSIS — Z0000 Encounter for general adult medical examination without abnormal findings: Secondary | ICD-10-CM | POA: Diagnosis not present

## 2024-03-14 DIAGNOSIS — I10 Essential (primary) hypertension: Secondary | ICD-10-CM | POA: Diagnosis not present

## 2024-03-14 DIAGNOSIS — Z7984 Long term (current) use of oral hypoglycemic drugs: Secondary | ICD-10-CM

## 2024-03-14 DIAGNOSIS — Z7985 Long-term (current) use of injectable non-insulin antidiabetic drugs: Secondary | ICD-10-CM

## 2024-03-14 DIAGNOSIS — I7121 Aneurysm of the ascending aorta, without rupture: Secondary | ICD-10-CM

## 2024-03-14 DIAGNOSIS — N529 Male erectile dysfunction, unspecified: Secondary | ICD-10-CM

## 2024-03-14 LAB — LIPID PANEL
Cholesterol: 109 mg/dL (ref 0–200)
HDL: 30.7 mg/dL — ABNORMAL LOW (ref >39.00)
LDL Cholesterol: 36 mg/dL (ref 0–99)
NonHDL: 78.22
Total CHOL/HDL Ratio: 4
Triglycerides: 211 mg/dL — ABNORMAL HIGH (ref 0.0–149.0)
VLDL: 42.2 mg/dL — ABNORMAL HIGH (ref 0.0–40.0)

## 2024-03-14 LAB — COMPREHENSIVE METABOLIC PANEL WITH GFR
ALT: 17 U/L (ref 0–53)
AST: 12 U/L (ref 0–37)
Albumin: 4.3 g/dL (ref 3.5–5.2)
Alkaline Phosphatase: 80 U/L (ref 39–117)
BUN: 18 mg/dL (ref 6–23)
CO2: 27 meq/L (ref 19–32)
Calcium: 9.6 mg/dL (ref 8.4–10.5)
Chloride: 100 meq/L (ref 96–112)
Creatinine, Ser: 0.93 mg/dL (ref 0.40–1.50)
GFR: 90.55 mL/min (ref >60.00)
Glucose, Bld: 137 mg/dL — ABNORMAL HIGH (ref 70–99)
Potassium: 3.9 meq/L (ref 3.5–5.1)
Sodium: 138 meq/L (ref 135–145)
Total Bilirubin: 0.7 mg/dL (ref 0.2–1.2)
Total Protein: 6.7 g/dL (ref 6.0–8.3)

## 2024-03-14 LAB — PSA: PSA: 0.35 ng/mL (ref 0.10–4.00)

## 2024-03-14 LAB — HEMOGLOBIN A1C: Hgb A1c MFr Bld: 7.1 % — ABNORMAL HIGH (ref 4.6–6.5)

## 2024-03-14 NOTE — Assessment & Plan Note (Signed)
Immunizations UTD. Declines influenza vaccine. Colonoscopy UTD, due 2027 PSA due and pending.  Discussed the importance of a healthy diet and regular exercise in order for weight loss, and to reduce the risk of further co-morbidity.  Exam stable. Labs pending.  Follow up in 1 year for repeat physical.

## 2024-03-14 NOTE — Assessment & Plan Note (Signed)
 Controlled.  Continue spironolactone  25 mg BID, carvedilol  6.25 mg BID, losartan  100 mg daily.   CMP pending.

## 2024-03-14 NOTE — Assessment & Plan Note (Signed)
 Stable, actually decreased in size after review of CTA from 2024.  Continue monitoring.

## 2024-03-14 NOTE — Assessment & Plan Note (Signed)
 Appears euvolemic today. Following with cardiology, office notes reviewed from December 2024.  Continue furosemide  40 mg daily, spironolactone  25 mg BID, carvedilol  6.25 mg BID.

## 2024-03-14 NOTE — Progress Notes (Signed)
 Subjective:    Patient ID: Dennis Curry, male    DOB: 11-11-1965, 58 y.o.   MRN: [id number]  Dennis Curry is a very pleasant 58 y.o. male who presents today for complete physical and follow up of chronic conditions.   Immunizations: -Tetanus: Completed in 2021 -Influenza: Declines influenza vaccine.  -Shingles: Completed Shingrix series -Pneumonia: Completed  pneumovax 23 in 2019  Diet: Fair diet.  Exercise: No regular exercise.  Eye exam: Completes annually  Dental exam: Completed 1 year   Colonoscopy: Completed in 2017, due 2027  PSA: Due  Wt Readings from Last 3 Encounters:  03/14/24 207 lb (93.9 kg)  09/11/23 216 lb (98 kg)  06/18/23 215 lb (97.5 kg)      BP Readings from Last 3 Encounters:  03/14/24 134/68  09/11/23 136/68  06/18/23 122/64        Review of Systems  Constitutional:  Negative for unexpected weight change.  HENT:  Negative for rhinorrhea.   Respiratory:  Negative for cough and shortness of breath.   Cardiovascular:  Negative for chest pain.  Gastrointestinal:  Negative for constipation and diarrhea.  Genitourinary:  Negative for difficulty urinating.  Musculoskeletal:  Negative for arthralgias and myalgias.  Skin:  Positive for color change and wound.  Allergic/Immunologic: Negative for environmental allergies.  Neurological:  Negative for dizziness, numbness and headaches.  Psychiatric/Behavioral:  The patient is not nervous/anxious.          Past Medical History:  Diagnosis Date   Aortic insufficiency    a. TTE 11/30/14: EF 25-30%, bicupid valve cannot be excluded, mod-severe aortic regurge, aortic root dimension 52 mm, mild MR, mildly dilated LA/RA, PASP 60; TEE 12/01/14: EF 25-30%, diffuse HK, aortic valve trileaflet, mild to mod Ao regurge, ascending aortic grossly measured at 4.5 cm (not well visualized),    Ascending aorta dilatation (HCC)    a. 52 mm by TTE 11/2014; b. 45 mm by TEE 11/2014 (not well visualized)   Chronic  systolic CHF (congestive heart failure) (HCC)    a. TTE 11/30/14: EF 25-30%, bicupid valve cannot be excluded, mod-severe aortic regurge, aortic root dimension 52 mm, mild MR, mildly dilated LA/RA, PASP 60; TEE 12/01/14: EF 25-30%, diffuse HK, aortic valve trileaflet, mild to mod Ao regurge, ascending aortic grossly measured at 4.5 cm (not well visualized),    Diabetes mellitus without complication (HCC)    Hyperlipidemia    Hypertension    Influenza A 09/02/2018   Nonischemic dilated cardiomyopathy (HCC)    a. cath 12/02/14: right dom coronary system w/ mild lumenal irregs, o/w no sig CAD, sev diffuse HK EF 25%, no sig MR or AS, med Rx recommeded    Thyroid  nodule 01/01/2019    Social History   Socioeconomic History   Marital status: Married    Spouse name: Not on file   Number of children: Not on file   Years of education: Not on file   Highest education level: Associate degree: academic program  Occupational History   Not on file  Tobacco Use   Smoking status: Never   Smokeless tobacco: Never  Vaping Use   Vaping status: Never Used  Substance and Sexual Activity   Alcohol use: No    Alcohol/week: 0.0 standard drinks of alcohol   Drug use: No   Sexual activity: Not on file  Other Topics Concern   Not on file  Social History Narrative   Married.   Works as a Engineer, structural at [employer].   No children.  Enjoys playing guitar, traveling, spending time with his wife.   Social Drivers of Corporate investment banker Strain: Low Risk  (03/11/2024)   Overall Financial Resource Strain (CARDIA)    Difficulty of Paying Living Expenses: Not hard at all  Food Insecurity: No Food Insecurity (03/11/2024)   Hunger Vital Sign    Worried About Running Out of Food in the Last Year: Never true    Ran Out of Food in the Last Year: Never true  Transportation Needs: No Transportation Needs (03/11/2024)   PRAPARE - Administrator, Civil Service (Medical): No    Lack of Transportation  (Non-Medical): No  Physical Activity: Insufficiently Active (03/11/2024)   Exercise Vital Sign    Days of Exercise per Week: 3 days    Minutes of Exercise per Session: 30 min  Stress: No Stress Concern Present (03/11/2024)   [employer] of Occupational Health - Occupational Stress Questionnaire    Feeling of Stress: Not at all  Social Connections: Moderately Isolated (03/11/2024)   Social Connection and Isolation Panel    Frequency of Communication with Friends and Family: More than three times a week    Frequency of Social Gatherings with Friends and Family: More than three times a week    Attends Religious Services: Never    Database administrator or Organizations: No    Attends Engineer, structural: Not on file    Marital Status: Married  Catering manager Violence: Not on file    Past Surgical History:  Procedure Laterality Date   CARDIAC CATHETERIZATION N/A 12/02/2014   Procedure: Left Heart Cath and Coronary Angiography;  Surgeon: Evalene JINNY Lunger, MD;  Location: [facility] INVASIVE CV LAB;  Service: Cardiovascular;  Laterality: N/A;   COLONOSCOPY  1992   mandatory w [employer] company where pt emp   CORONARY ANGIOPLASTY     OTHER SURGICAL HISTORY     none    Family History  Problem Relation Age of Onset   Coronary artery disease Mother    Hypertension Father    Diabetes Mellitus II Paternal Uncle    Colon cancer Neg Hx     No Known Allergies  Current Outpatient Medications on File Prior to Visit  Medication Sig Dispense Refill   aspirin  81 MG tablet Take 81 mg by mouth daily.     atorvastatin  (LIPITOR) 40 MG tablet TAKE 1 TABLET BY MOUTH EVERYDAY AT BEDTIME 90 tablet 3   carvedilol  (COREG ) 6.25 MG tablet Take 1 tablet (6.25 mg total) by mouth 2 (two) times daily with a meal. 180 tablet 3   Dulaglutide  (TRULICITY ) 1.5 MG/0.5ML SOAJ INJECT 1.5 MG INTO THE SKIN ONCE A WEEK. FOR DIABETES. 6 mL 1   furosemide  (LASIX ) 40 MG tablet Take 1 tablet (40 mg total) by mouth  daily. 90 tablet 3   glipiZIDE  (GLUCOTROL  XL) 10 MG 24 hr tablet TAKE 1 TABLET BY MOUTH EVERY DAY WITH BREAKFAST FOR DIABETES 90 tablet 0   losartan  (COZAAR ) 100 MG tablet Take 1 tablet (100 mg total) by mouth daily. 90 tablet 3   metFORMIN  (GLUCOPHAGE ) 1000 MG tablet TAKE 1 TABLET (1,000 MG TOTAL) BY MOUTH 2 (TWO) TIMES DAILY WITH A MEAL. FOR DIABETES. 180 tablet 0   spironolactone  (ALDACTONE ) 25 MG tablet Take 1 tablet (25 mg total) by mouth 2 (two) times daily. 180 tablet 3   tadalafil  (CIALIS ) 5 MG tablet TAKE 1 TABLET BY MOUTH EVERY 3 DAYS AS NEEDED FOR ERECTILE DYSFUNCTION. 90 tablet  0   Current Facility-Administered Medications on File Prior to Visit  Medication Dose Route Frequency Provider Last Rate Last Admin   acetaminophen  (TYLENOL ) tablet 650 mg  650 mg Oral Q4H PRN Gollan, Timothy J, MD        BP 134/68   Pulse 74   Temp 98.3 F (36.8 C) (Temporal)   Ht 6' (1.829 m)   Wt 207 lb (93.9 kg)   SpO2 98%   BMI 28.07 kg/m  Objective:   Physical Exam HENT:     Right Ear: Tympanic membrane and ear canal normal.     Left Ear: Tympanic membrane and ear canal normal.  Eyes:     Pupils: Pupils are equal, round, and reactive to light.  Cardiovascular:     Rate and Rhythm: Normal rate and regular rhythm.  Pulmonary:     Effort: Pulmonary effort is normal.     Breath sounds: Normal breath sounds.  Abdominal:     General: Bowel sounds are normal.     Palpations: Abdomen is soft.     Tenderness: There is no abdominal tenderness.  Musculoskeletal:        General: Normal range of motion.     Cervical back: Neck supple.  Skin:    General: Skin is warm and dry.     Comments: Mild erythema to right anterior great thumb. Right great thumbnail with bruising. No drainage.   Neurological:     Mental Status: He is alert and oriented to person, place, and time.     Cranial Nerves: No cranial nerve deficit.     Deep Tendon Reflexes:     Reflex Scores:      Patellar reflexes are 2+ on  the right side and 2+ on the left side. Psychiatric:        Mood and Affect: Mood normal.     Physical Exam        Assessment & Plan:  Preventative health care Assessment & Plan: Immunizations UTD. Declines influenza vaccine.  Colonoscopy UTD, due 2027 PSA due and pending.  Discussed the importance of a healthy diet and regular exercise in order for weight loss, and to reduce the risk of further co-morbidity.  Exam stable. Labs pending.  Follow up in 1 year for repeat physical.    Essential hypertension Assessment & Plan: Controlled.  Continue spironolactone  25 mg BID, carvedilol  6.25 mg BID, losartan  100 mg daily.   CMP pending.   Orders: -     Comprehensive metabolic panel with GFR  Chronic systolic heart failure (HCC) Assessment & Plan: Appears euvolemic today. Following with cardiology, office notes reviewed from December 2024.  Continue furosemide  40 mg daily, spironolactone  25 mg BID, carvedilol  6.25 mg BID.    Aneurysm of ascending aorta without rupture [facility]) Assessment & Plan: Stable, actually decreased in size after review of CTA from 2024.  Continue monitoring.    Type 2 diabetes mellitus with other circulatory complication, without long-term current use of insulin  (HCC) Assessment & Plan: Repeat A1C pending.  Continue metformin  1000 mg BID, glipizide  XL 10 mg daily, Trulicity  1.5 mg weekly.   Follow up in 6 months.   Orders: -     Hemoglobin A1c  Mixed hyperlipidemia Assessment & Plan: Repeat lipid panel pending. Continue atorvastatin  40 mg daily, aspirin  81 mg daily.  Orders: -     Lipid panel  Erectile dysfunction, unspecified erectile dysfunction type Assessment & Plan: Stable.  Continue Cialis  5 mg every 3 days as needed.  Screening for prostate cancer -     PSA    Assessment and Plan Assessment & Plan         Comer MARLA Gaskins, NP     History of Present Illness

## 2024-03-14 NOTE — Patient Instructions (Signed)
 Stop by the lab prior to leaving today. I will notify you of your results once received.   Please schedule a follow up visit for 6 months for a diabetes check.  It was a pleasure to see you today!

## 2024-03-14 NOTE — Assessment & Plan Note (Signed)
 Stable.  Continue Cialis  5 mg every 3 days as needed.

## 2024-03-14 NOTE — Assessment & Plan Note (Signed)
 Repeat lipid panel pending. Continue atorvastatin 40 mg daily, aspirin 81 mg daily

## 2024-03-14 NOTE — Assessment & Plan Note (Signed)
 Repeat A1C pending.  Continue metformin  1000 mg BID, glipizide  XL 10 mg daily, Trulicity  1.5 mg weekly.   Follow up in 6 months.

## 2024-04-14 LAB — OPHTHALMOLOGY REPORT-SCANNED

## 2024-04-15 ENCOUNTER — Other Ambulatory Visit: Payer: Self-pay | Admitting: Primary Care

## 2024-04-15 DIAGNOSIS — E1159 Type 2 diabetes mellitus with other circulatory complications: Secondary | ICD-10-CM

## 2024-05-17 ENCOUNTER — Other Ambulatory Visit: Payer: Self-pay | Admitting: Primary Care

## 2024-05-17 DIAGNOSIS — E1159 Type 2 diabetes mellitus with other circulatory complications: Secondary | ICD-10-CM

## 2024-05-17 DIAGNOSIS — E1165 Type 2 diabetes mellitus with hyperglycemia: Secondary | ICD-10-CM

## 2024-06-07 ENCOUNTER — Other Ambulatory Visit: Payer: Self-pay | Admitting: Cardiovascular Disease

## 2024-06-07 DIAGNOSIS — E785 Hyperlipidemia, unspecified: Secondary | ICD-10-CM

## 2024-06-08 ENCOUNTER — Other Ambulatory Visit: Payer: Self-pay | Admitting: Primary Care

## 2024-06-08 DIAGNOSIS — N529 Male erectile dysfunction, unspecified: Secondary | ICD-10-CM

## 2024-07-01 ENCOUNTER — Other Ambulatory Visit: Payer: Self-pay | Admitting: Cardiovascular Disease

## 2024-07-01 DIAGNOSIS — E785 Hyperlipidemia, unspecified: Secondary | ICD-10-CM

## 2024-07-12 ENCOUNTER — Other Ambulatory Visit: Payer: Self-pay | Admitting: Cardiovascular Disease

## 2024-07-12 DIAGNOSIS — E785 Hyperlipidemia, unspecified: Secondary | ICD-10-CM

## 2024-07-31 ENCOUNTER — Other Ambulatory Visit: Payer: Self-pay | Admitting: Cardiovascular Disease

## 2024-07-31 DIAGNOSIS — E785 Hyperlipidemia, unspecified: Secondary | ICD-10-CM

## 2024-08-06 NOTE — Telephone Encounter (Signed)
Please contact pt for future appointment. Pt overdue for follow up.

## 2024-08-07 NOTE — Telephone Encounter (Signed)
Scheduled 02/10

## 2024-08-09 ENCOUNTER — Other Ambulatory Visit: Payer: Self-pay | Admitting: Cardiovascular Disease

## 2024-08-09 DIAGNOSIS — I10 Essential (primary) hypertension: Secondary | ICD-10-CM

## 2024-08-10 ENCOUNTER — Other Ambulatory Visit: Payer: Self-pay | Admitting: Cardiovascular Disease

## 2024-08-10 DIAGNOSIS — I10 Essential (primary) hypertension: Secondary | ICD-10-CM

## 2024-08-15 ENCOUNTER — Other Ambulatory Visit: Payer: Self-pay | Admitting: Cardiovascular Disease

## 2024-08-15 DIAGNOSIS — E785 Hyperlipidemia, unspecified: Secondary | ICD-10-CM

## 2024-08-19 ENCOUNTER — Ambulatory Visit: Admitting: Cardiovascular Disease

## 2024-09-11 ENCOUNTER — Ambulatory Visit: Admitting: Primary Care
